# Patient Record
Sex: Female | Born: 1935
Health system: Southern US, Community
[De-identification: ages and names within clinical notes are randomized; demographics above are authoritative.]

## PROBLEM LIST (undated history)

## (undated) DIAGNOSIS — E039 Hypothyroidism, unspecified: Secondary | ICD-10-CM

## (undated) DIAGNOSIS — M810 Age-related osteoporosis without current pathological fracture: Secondary | ICD-10-CM

## (undated) DIAGNOSIS — E041 Nontoxic single thyroid nodule: Secondary | ICD-10-CM

## (undated) HISTORY — DX: Hypothyroidism, unspecified: E03.9

## (undated) HISTORY — DX: Nontoxic single thyroid nodule: E04.1

## (undated) HISTORY — DX: Age-related osteoporosis without current pathological fracture: M81.0

---

## 2009-04-12 ENCOUNTER — Ambulatory Visit: Payer: Self-pay | Admitting: Family Medicine

## 2009-04-12 DIAGNOSIS — N9489 Other specified conditions associated with female genital organs and menstrual cycle: Secondary | ICD-10-CM

## 2009-04-12 LAB — CONVERTED CEMR LAB
Bilirubin Urine: NEGATIVE
Blood in Urine, dipstick: NEGATIVE
Protein, U semiquant: NEGATIVE
Specific Gravity, Urine: 1.02
pH: 5

## 2009-04-13 ENCOUNTER — Encounter: Payer: Self-pay | Admitting: Family Medicine

## 2009-04-13 LAB — CONVERTED CEMR LAB
Clue Cells Wet Prep HPF POC: NONE SEEN
WBC, Wet Prep HPF POC: NONE SEEN

## 2010-04-29 ENCOUNTER — Ambulatory Visit: Payer: Self-pay | Admitting: Family Medicine

## 2010-04-29 DIAGNOSIS — N760 Acute vaginitis: Secondary | ICD-10-CM | POA: Insufficient documentation

## 2010-04-29 LAB — CONVERTED CEMR LAB
Bilirubin Urine: NEGATIVE
Glucose, Urine, Semiquant: NEGATIVE
Nitrite: NEGATIVE
Urobilinogen, UA: 0.2

## 2010-04-30 ENCOUNTER — Encounter: Payer: Self-pay | Admitting: Family Medicine

## 2010-05-01 LAB — CONVERTED CEMR LAB
Clue Cells Wet Prep HPF POC: NONE SEEN
Trich, Wet Prep: NONE SEEN

## 2010-05-03 ENCOUNTER — Telehealth (INDEPENDENT_AMBULATORY_CARE_PROVIDER_SITE_OTHER): Payer: Self-pay | Admitting: *Deleted

## 2010-08-20 NOTE — Progress Notes (Signed)
Summary: pt with a med. question-jr  Phone Note Call from Patient   Caller: Patient Summary of Call: Was treated last week for a UTI with a 3 day Rx. and she is leaving for [place] and would like to have another Rx. on hand since she is going out of town.... Call her back and let her know the status at [phone].Marland KitchenMarland KitchenIf you need me to, I will call her back and let her know what you decided to do...Marland KitchenMarland KitchenThanks.Michaelle Copas  May 03, 2010 9:09 AM  Initial call taken by: Michaelle Copas,  May 03, 2010 9:09 AM  Follow-up for Phone Call        Will refill x 1 but if self treats and doesn' tget better then needs to see an MD down there.  Follow-up by: Nani Gasser MD,  May 03, 2010 11:56 AM  Additional Follow-up for Phone Call Additional follow up Details #1::        Pt notified Additional Follow-up by: Kathlene November LPN,  May 03, 2010 11:59 AM    Prescriptions: BACTRIM DS 800-160 MG TABS (SULFAMETHOXAZOLE-TRIMETHOPRIM) Take 1 tablet by mouth two times a day for 3 days  #6 x 0   Entered and Authorized by:   Nani Gasser MD   Signed by:   Nani Gasser MD on 05/03/2010   Method used:   [facility]        [facility] [phone][facility] (retail)       [address] [place], [place]  [address]       Ph: [phone] or [phone]       Fax: [phone]   RxID:   [id number]

## 2010-08-20 NOTE — Assessment & Plan Note (Signed)
Summary: HAVING PROBLEM W/ URINATING/PAIN   Vital Signs:  Patient profile:   77 year old female Height:      63.5 inches Weight:      126 pounds BMI:     22.05 Pulse rate:   87 / minute BP sitting:   154 / 71  (right arm) Cuff size:   regular  Vitals Entered By: Avon Gully CMA, Duncan Dull) (April 29, 2010 12:55 PM) CC: vaginal irritation, incourse last wed, vaginal irritation thurs and friday, did have a soreness in rt groin   Primary Care Provider:  Nani Gasser MD  CC:  vaginal irritation, incourse last wed, vaginal irritation thurs and friday, and did have a soreness in rt groin.  History of Present Illness: Bp was 120/60 a week ago  Had intercouse last week. Felt sore afterwards.  After a few days couldn't urinate without extreme pain.  Burning sensation.  Wonders if irritation from intercourse. Did use a lubricant. Did use the clobetasol twice and this did help some.  It is a little better today.  No change in vaginal d/c.    Current Medications (verified): 1)  Multivitamins  Tabs (Multiple Vitamin) .... Take One Tablt By Mouth Once A Day 2)  Clobetasol Propionate 0.05 % Oint (Clobetasol Propionate) .... Apply Two Times A Day To Affected Area As Needed  Allergies (verified): 1)  ! * Mycins  Physical Exam  General:  Well-developed,well-nourished,in no acute distress; alert,appropriate and cooperative throughout examination Genitalia:  + vaginal atrophy. Has somesmall skin tears at the 12 o'clock position, No sign of infection or lesions.    Impression & Recommendations:  Problem # 1:  VAGINITIS (ICD-616.10)  Discussed vaginal atrophy. Recommend treat with a topical estrogen cream since she is still sexually active. Will send a wet prep and urine cx since di dhave blood in the UA.   For now recommend use Replens.    Orders: T-Wet Prep for Christoper Allegra, [facility] [phone]) T-Urine Culture (Spectrum Order) [phone])  Complete Medication List: 1)   Multivitamins Tabs (Multiple vitamin) .... Take one tablt by mouth once a day 2)  Clobetasol Propionate 0.05 % Oint (Clobetasol propionate) .... Apply two times a day to affected area as needed 3)  Estrace 0.1 Mg/gm Crea (Estradiol) .... Apply  gram vaginally with applicator  2 x per week. once symptoms improved can dec to 1 x week. Prescriptions: ESTRACE 0.1 MG/GM CREA (ESTRADIOL) Apply  gram vaginally with applicator  2 x per week. ONce symptoms improved can dec to 1 x week.  #1 kit x 3   Entered and Authorized by:   Nani Gasser MD   Signed by:   Nani Gasser MD on 04/29/2010   Method used:   [facility]        [facility] [phone]* (retail)       [address] [place], [place]  [address]       Ph: [phone] or [phone]       Fax: [phone]   RxID:   [id number]   Laboratory Results   Urine Tests  Date/Time Received: 10/10/111 Date/Time Reported: 04/29/10  Routine Urinalysis   Color: yellow Appearance: Clear Glucose: negative   (Normal Range: Negative) Bilirubin: negative   (Normal Range: Negative) Ketone: negative   (Normal Range: Negative) Spec. Gravity: 1.025   (Normal Range: 1.003-1.035) Blood: large   (Normal Range: Negative) pH: 5.0   (Normal Range: 5.0-8.0) Protein: 30   (  Normal Range: Negative) Urobilinogen: 0.2   (Normal Range: 0-1) Nitrite: negative   (Normal Range: Negative) Leukocyte Esterace: small   (Normal Range: Negative)

## 2011-02-27 ENCOUNTER — Ambulatory Visit (INDEPENDENT_AMBULATORY_CARE_PROVIDER_SITE_OTHER): Payer: BC Managed Care – PPO | Admitting: Family Medicine

## 2011-02-27 DIAGNOSIS — Z23 Encounter for immunization: Secondary | ICD-10-CM

## 2011-02-27 MED ORDER — VARICELLA VIRUS VACCINE LIVE 1350 PFU/0.5ML IJ SUSR
0.5000 mL | Freq: Once | INTRAMUSCULAR | Status: DC
  Administered 2011-02-27: 0.5 mL via SUBCUTANEOUS

## 2011-02-27 NOTE — Progress Notes (Signed)
  Subjective:    Patient ID: Claire Cox, female    DOB: 18-May-1934, 77 y.o.   MRN: [id number]  HPI  Here for shingles vaccine.   Review of Systems     Objective:   Physical Exam        Assessment & Plan:

## 2011-02-28 MED ORDER — ZOSTER VACCINE LIVE 19400 UNT/0.65ML ~~LOC~~ SOLR: 0.6500 mL | Freq: Once | SUBCUTANEOUS | Status: DC

## 2011-03-24 ENCOUNTER — Inpatient Hospital Stay (INDEPENDENT_AMBULATORY_CARE_PROVIDER_SITE_OTHER)
Admission: RE | Admit: 2011-03-24 | Discharge: 2011-03-24 | Disposition: A | Discharge status: Home / Self Care | Payer: BC Managed Care – PPO | Source: Ambulatory Visit | Attending: Family Medicine | Admitting: Family Medicine

## 2011-03-24 ENCOUNTER — Encounter: Payer: Self-pay | Admitting: Emergency Medicine

## 2011-03-24 DIAGNOSIS — M25529 Pain in unspecified elbow: Secondary | ICD-10-CM

## 2011-03-24 DIAGNOSIS — M25519 Pain in unspecified shoulder: Secondary | ICD-10-CM | POA: Insufficient documentation

## 2011-03-25 ENCOUNTER — Telehealth (INDEPENDENT_AMBULATORY_CARE_PROVIDER_SITE_OTHER): Payer: Self-pay | Admitting: Emergency Medicine

## 2011-04-04 ENCOUNTER — Ambulatory Visit (INDEPENDENT_AMBULATORY_CARE_PROVIDER_SITE_OTHER): Payer: BC Managed Care – PPO | Admitting: Family Medicine

## 2011-04-04 DIAGNOSIS — Z23 Encounter for immunization: Secondary | ICD-10-CM

## 2011-04-04 NOTE — Progress Notes (Signed)
  Subjective:    Patient ID: Claire Cox, female    DOB: 1934-05-26, 77 y.o.   MRN: [id number]  HPI TDAP only    Review of Systems     Objective:   Physical Exam        Assessment & Plan:

## 2011-06-23 NOTE — Telephone Encounter (Signed)
  Phone Note Outgoing Call   Call placed by: Lavell Islam RN,  March 25, 2011 2:27 PM Call placed to: Patient Summary of Call: Left message on voice mail inquiring about patient's status; encouraged her to call with questions/concerns. Initial call taken by: Lavell Islam RN,  March 25, 2011 2:28 PM

## 2011-06-23 NOTE — Progress Notes (Signed)
Summary: fall,arm inj...wse rm 3   Vital Signs:  Patient Profile:   77 Years Old Female CC:      RT elbow pain x this AM Height:     63.5 inches Weight:      125.50 pounds O2 Sat:      100 % O2 treatment:    Room Air Temp:     98.0 degrees F oral Pulse rate:   87 / minute Resp:     18 per minute BP sitting:   128 / 65  (left arm) Cuff size:   regular  Vitals Entered By: Clemens Catholic LPN (March 24, 2011 12:06 PM)                  Updated Prior Medication List: MULTIVITAMINS  TABS (MULTIPLE VITAMIN) Take one tablt by mouth once a day  Current Allergies: ! * MYCINS ! VALIUMHistory of Present Illness Chief Complaint: RT elbow pain x this AM History of Present Illness: She was outside this AM around 10am and tripped and fell.  She landed on her R side and felt pain in her R elbow that radiated to her R hand.  No h/o osteoporosis or other fractures.  She went into her house and became slightly faint due to the pain, so she iced it.  She did  not hit her head.  Since then, the pain has improved and after the wait in the waiting room, she feels almost 100% better.  Residual symptoms include mild R elbow pain, mild R posterior shoulder pain, no radiation.  She has not used any meds but ice does help.  REVIEW OF SYSTEMS Constitutional Symptoms      Denies fever, chills, night sweats, weight loss, weight gain, and fatigue.  Eyes       Denies change in vision, eye pain, eye discharge, glasses, contact lenses, and eye surgery. Ear/Nose/Throat/Mouth       Denies hearing loss/aids, change in hearing, ear pain, ear discharge, dizziness, frequent runny nose, frequent nose bleeds, sinus problems, sore throat, hoarseness, and tooth pain or bleeding.  Respiratory       Denies dry cough, productive cough, wheezing, shortness of breath, asthma, bronchitis, and emphysema/COPD.  Cardiovascular       Complains of fanting.      Denies murmurs, chest pain, tires easily with exhertion, and  fainting.    Gastrointestinal       Denies stomach pain, nausea/vomiting, diarrhea, constipation, blood in bowel movements, and indigestion.      Comments: nausea Genitourniary       Denies painful urination, kidney stones, and loss of urinary control. Neurological       Denies paralysis, seizures, and fainting/blackouts. Musculoskeletal       Denies muscle pain, joint pain, joint stiffness, decreased range of motion, redness, swelling, muscle weakness, and gout.  Skin       Denies bruising, unusual mles/lumps or sores, and hair/skin or nail changes.  Psych       Denies mood changes, temper/anger issues, anxiety/stress, speech problems, depression, and sleep problems. Other Comments: pt states that she fell this AM and injured her RT elbow, scraped her RT low back and Bil hands. she has a knot on her RT shoulder. she states that she felt like she was going to pass out and nausea after fall from the pain.   Past History:  Past Medical History: Reviewed history from 04/12/2009 and no changes required. infectious Hepatitis in 1960  Past Surgical History:  Reviewed history from 04/12/2009 and no changes required. Finger Surgery 11/2008  Family History: Reviewed history from 04/12/2009 and no changes required. Both parent with MI  Social History: Reviewed history from 04/12/2009 and no changes required. REtired. Masters degreee.   Former Smoker, [id number] Alcohol use-yes, 1 per week Drug use-no Regular exercise-yes, walking and weight 5-6 day per week.  Physical Exam General appearance: well developed, well nourished, no acute distress MSE: oriented to time, place, and person R elbow: FROM flex/ext/sup/pron, mild TTP insertion of triceps, but no bony TTP radial head or olecranon.  Distal NV status is intact.  NO bruising or swelling noticed. R shoulder: FROM, mild TTP and spasm at levator scapula and superior angle.  No bruising.  Spurling neg. Assessment New Problems: SHOULDER  PAIN (ICD-719.41) ELBOW PAIN (ICD-719.42)   Plan New Orders: New Patient Level II [99202] Planning Comments:   Talking with the patient and her husband, I don't feel that an Xray is warranted since her pain is almost completely gone.  Due to her age, if not improving, I told her to return for an Xray.  Instead should use ice, elevation, rest, Motrin as needed.  I feel that she may have an insertional triceps traumatic tendonitis with trapezius/levator scapula strain.  If any HA, confusion, worsening symptoms, needs to seek medical care.  We'll call her tomorrow to follow up.   The patient and/or caregiver has been counseled thoroughly with regard to medications prescribed including dosage, schedule, interactions, rationale for use, and possible side effects and they verbalize understanding.  Diagnoses and expected course of recovery discussed and will return if not improved as expected or if the condition worsens. Patient and/or caregiver verbalized understanding.   Orders Added: 1)  New Patient Level II [99202]

## 2011-08-19 DIAGNOSIS — H4300 Vitreous prolapse, unspecified eye: Secondary | ICD-10-CM | POA: Diagnosis not present

## 2011-08-19 DIAGNOSIS — H35379 Puckering of macula, unspecified eye: Secondary | ICD-10-CM | POA: Diagnosis not present

## 2011-08-19 DIAGNOSIS — H25019 Cortical age-related cataract, unspecified eye: Secondary | ICD-10-CM | POA: Diagnosis not present

## 2011-08-19 DIAGNOSIS — H526 Other disorders of refraction: Secondary | ICD-10-CM | POA: Diagnosis not present

## 2011-08-27 DIAGNOSIS — E049 Nontoxic goiter, unspecified: Secondary | ICD-10-CM | POA: Diagnosis not present

## 2011-08-27 DIAGNOSIS — E041 Nontoxic single thyroid nodule: Secondary | ICD-10-CM | POA: Diagnosis not present

## 2011-09-01 DIAGNOSIS — H2700 Aphakia, unspecified eye: Secondary | ICD-10-CM | POA: Diagnosis not present

## 2011-09-01 DIAGNOSIS — H2510 Age-related nuclear cataract, unspecified eye: Secondary | ICD-10-CM | POA: Diagnosis not present

## 2011-09-01 DIAGNOSIS — H4020X0 Unspecified primary angle-closure glaucoma, stage unspecified: Secondary | ICD-10-CM | POA: Diagnosis not present

## 2011-09-01 DIAGNOSIS — H40019 Open angle with borderline findings, low risk, unspecified eye: Secondary | ICD-10-CM | POA: Diagnosis not present

## 2011-09-01 DIAGNOSIS — H25019 Cortical age-related cataract, unspecified eye: Secondary | ICD-10-CM | POA: Diagnosis not present

## 2011-09-04 DIAGNOSIS — M949 Disorder of cartilage, unspecified: Secondary | ICD-10-CM | POA: Diagnosis not present

## 2011-09-04 DIAGNOSIS — M899 Disorder of bone, unspecified: Secondary | ICD-10-CM | POA: Diagnosis not present

## 2011-09-04 DIAGNOSIS — E041 Nontoxic single thyroid nodule: Secondary | ICD-10-CM | POA: Diagnosis not present

## 2011-09-08 DIAGNOSIS — E041 Nontoxic single thyroid nodule: Secondary | ICD-10-CM | POA: Diagnosis not present

## 2011-09-08 DIAGNOSIS — M818 Other osteoporosis without current pathological fracture: Secondary | ICD-10-CM | POA: Diagnosis not present

## 2011-09-11 DIAGNOSIS — E041 Nontoxic single thyroid nodule: Secondary | ICD-10-CM | POA: Diagnosis not present

## 2011-09-11 DIAGNOSIS — M818 Other osteoporosis without current pathological fracture: Secondary | ICD-10-CM | POA: Diagnosis not present

## 2011-09-17 DIAGNOSIS — E041 Nontoxic single thyroid nodule: Secondary | ICD-10-CM | POA: Diagnosis not present

## 2011-09-17 DIAGNOSIS — M818 Other osteoporosis without current pathological fracture: Secondary | ICD-10-CM | POA: Diagnosis not present

## 2011-09-19 HISTORY — PX: CATARACT EXTRACTION W/ INTRAOCULAR LENS IMPLANT: SHX1309

## 2011-09-23 DIAGNOSIS — E041 Nontoxic single thyroid nodule: Secondary | ICD-10-CM | POA: Diagnosis not present

## 2011-09-26 ENCOUNTER — Other Ambulatory Visit: Payer: Self-pay | Admitting: *Deleted

## 2011-09-26 MED ORDER — CLOBETASOL PROPIONATE 0.05 % EX OINT: TOPICAL_OINTMENT | Freq: Two times a day (BID) | CUTANEOUS | Status: DC

## 2011-09-26 NOTE — Telephone Encounter (Signed)
Med entered. No pharm selected. If doesn't resolve as usual then needs appt for eval.

## 2011-09-26 NOTE — Telephone Encounter (Signed)
Pt is having a vaginal lesion outbreak and would like to know if we can send an rx for clobetasol propionate 0.05% ointment to the pharmacy. States the last one she had filled is old. Please advise.

## 2011-10-06 DIAGNOSIS — E039 Hypothyroidism, unspecified: Secondary | ICD-10-CM | POA: Diagnosis not present

## 2011-10-06 DIAGNOSIS — Z79899 Other long term (current) drug therapy: Secondary | ICD-10-CM | POA: Diagnosis not present

## 2011-10-06 DIAGNOSIS — H269 Unspecified cataract: Secondary | ICD-10-CM | POA: Diagnosis not present

## 2011-10-06 DIAGNOSIS — K219 Gastro-esophageal reflux disease without esophagitis: Secondary | ICD-10-CM | POA: Diagnosis not present

## 2011-10-06 DIAGNOSIS — M129 Arthropathy, unspecified: Secondary | ICD-10-CM | POA: Diagnosis not present

## 2011-10-06 DIAGNOSIS — H268 Other specified cataract: Secondary | ICD-10-CM | POA: Diagnosis not present

## 2011-10-06 DIAGNOSIS — M899 Disorder of bone, unspecified: Secondary | ICD-10-CM | POA: Diagnosis not present

## 2011-10-06 DIAGNOSIS — H2510 Age-related nuclear cataract, unspecified eye: Secondary | ICD-10-CM | POA: Diagnosis not present

## 2011-10-07 DIAGNOSIS — H2510 Age-related nuclear cataract, unspecified eye: Secondary | ICD-10-CM | POA: Diagnosis not present

## 2011-10-20 DIAGNOSIS — E041 Nontoxic single thyroid nodule: Secondary | ICD-10-CM | POA: Diagnosis not present

## 2011-10-20 DIAGNOSIS — M818 Other osteoporosis without current pathological fracture: Secondary | ICD-10-CM | POA: Diagnosis not present

## 2011-10-20 DIAGNOSIS — E039 Hypothyroidism, unspecified: Secondary | ICD-10-CM | POA: Diagnosis not present

## 2011-10-30 DIAGNOSIS — E041 Nontoxic single thyroid nodule: Secondary | ICD-10-CM | POA: Diagnosis not present

## 2011-10-30 DIAGNOSIS — E063 Autoimmune thyroiditis: Secondary | ICD-10-CM | POA: Diagnosis not present

## 2011-11-17 DIAGNOSIS — S40019A Contusion of unspecified shoulder, initial encounter: Secondary | ICD-10-CM | POA: Diagnosis not present

## 2011-11-17 DIAGNOSIS — E039 Hypothyroidism, unspecified: Secondary | ICD-10-CM | POA: Diagnosis not present

## 2011-11-17 DIAGNOSIS — M810 Age-related osteoporosis without current pathological fracture: Secondary | ICD-10-CM | POA: Diagnosis not present

## 2011-11-17 DIAGNOSIS — M25519 Pain in unspecified shoulder: Secondary | ICD-10-CM | POA: Diagnosis not present

## 2011-12-10 DIAGNOSIS — H01009 Unspecified blepharitis unspecified eye, unspecified eyelid: Secondary | ICD-10-CM | POA: Diagnosis not present

## 2011-12-10 DIAGNOSIS — H18239 Secondary corneal edema, unspecified eye: Secondary | ICD-10-CM | POA: Diagnosis not present

## 2011-12-24 DIAGNOSIS — E041 Nontoxic single thyroid nodule: Secondary | ICD-10-CM | POA: Diagnosis not present

## 2011-12-24 DIAGNOSIS — E039 Hypothyroidism, unspecified: Secondary | ICD-10-CM | POA: Diagnosis not present

## 2011-12-24 DIAGNOSIS — M818 Other osteoporosis without current pathological fracture: Secondary | ICD-10-CM | POA: Diagnosis not present

## 2012-01-19 ENCOUNTER — Encounter: Payer: Self-pay | Admitting: Family Medicine

## 2012-01-19 DIAGNOSIS — E041 Nontoxic single thyroid nodule: Secondary | ICD-10-CM

## 2012-01-19 DIAGNOSIS — E039 Hypothyroidism, unspecified: Secondary | ICD-10-CM | POA: Insufficient documentation

## 2012-01-19 DIAGNOSIS — M810 Age-related osteoporosis without current pathological fracture: Secondary | ICD-10-CM

## 2012-01-19 HISTORY — DX: Nontoxic single thyroid nodule: E04.1

## 2012-01-19 HISTORY — DX: Hypothyroidism, unspecified: E03.9

## 2012-01-19 HISTORY — DX: Age-related osteoporosis without current pathological fracture: M81.0

## 2012-01-21 DIAGNOSIS — H526 Other disorders of refraction: Secondary | ICD-10-CM | POA: Diagnosis not present

## 2012-01-21 DIAGNOSIS — H18239 Secondary corneal edema, unspecified eye: Secondary | ICD-10-CM | POA: Diagnosis not present

## 2012-01-21 DIAGNOSIS — H01009 Unspecified blepharitis unspecified eye, unspecified eyelid: Secondary | ICD-10-CM | POA: Diagnosis not present

## 2012-01-21 DIAGNOSIS — H26499 Other secondary cataract, unspecified eye: Secondary | ICD-10-CM | POA: Diagnosis not present

## 2012-02-09 ENCOUNTER — Encounter: Payer: Self-pay | Admitting: Family Medicine

## 2012-02-09 ENCOUNTER — Ambulatory Visit (INDEPENDENT_AMBULATORY_CARE_PROVIDER_SITE_OTHER): Payer: Medicare Other | Admitting: Family Medicine

## 2012-02-09 VITALS — BP 141/76 | HR 84 | Wt 121.0 lb

## 2012-02-09 DIAGNOSIS — M810 Age-related osteoporosis without current pathological fracture: Secondary | ICD-10-CM | POA: Diagnosis not present

## 2012-02-09 DIAGNOSIS — M25519 Pain in unspecified shoulder: Secondary | ICD-10-CM | POA: Diagnosis not present

## 2012-02-09 DIAGNOSIS — M25511 Pain in right shoulder: Secondary | ICD-10-CM

## 2012-02-09 DIAGNOSIS — E039 Hypothyroidism, unspecified: Secondary | ICD-10-CM | POA: Diagnosis not present

## 2012-02-09 DIAGNOSIS — E041 Nontoxic single thyroid nodule: Secondary | ICD-10-CM

## 2012-02-09 NOTE — Progress Notes (Signed)
  Subjective:    Patient ID: Claire Cox, female    DOB: 1933/12/26, 78 y.o.   MRN: [id number]  HPI Was in [place] someone with sprain the outside porch with water. Evidently water seeped through the door onto the tile entryway.  She slipped on the tile that was covered with water.  Landed on her shoulder and left hip. She's continued to have significant pain in her shoulder but says her hip really hasn't been bothering her.Micah Flesher to the ED and had an xray . She was told she had no fractures.  Seh would like a referral for cold laser therapy at [facility]. She has had some limitation with range of motion. She has not been taking any medication for her pain or discomfort.  Osteoporosis-she says she declines all treatment. Please see below for further details.   Review of Systems     Objective:   Physical Exam  Constitutional: She is oriented to person, place, and time. She appears well-developed and well-nourished.  HENT:  Head: Normocephalic and atraumatic.  Neck: Neck supple. No thyromegaly present.  Cardiovascular: Normal rate, regular rhythm and normal heart sounds.   Pulmonary/Chest: Effort normal and breath sounds normal.  Musculoskeletal:       Right shoulder is 10 to her over the lateral acromion. No bruising. She is able to extend to about 100. Painful with reaching across and reaching behind her back. She has decreased strength with abduction compared her left arm.  Lymphadenopathy:    She has no cervical adenopathy.  Neurological: She is alert and oriented to person, place, and time.  Skin: Skin is warm and dry.  Psychiatric: She has a normal mood and affect. Her behavior is normal.          Assessment & Plan:  Right Shoulder Pain  -based on her exam today suspect that she probably has a rotator cuff injury. Will refer to PT for therapy for cold laser. If she's not progressing or improving then please followup for further evaluation.  Can use ibuprofen  or Aleve with food and water. Stop immediately if any GI upset or irritation.  Thyroid Nodule - She is having Dr. Richardson Landry fax over records. She would like for me ot take over care of her thyroid.  She says she was given a prescription for at least 3 months the medication. At that time we can see if she's due for repeat lab check as well as refills on her medication.  Osteoporosis - He also ordered DEXA scan.  Tried fosamax years ago and had horriblle reflux.  Did reclast infucion before but wasn't to discontinue it.  Seh is not interested in any oral tabs.  She is taking a calcium supplement.

## 2012-02-09 NOTE — Patient Instructions (Signed)
We will call you with referral.

## 2012-02-10 ENCOUNTER — Telehealth: Payer: Self-pay | Admitting: Family Medicine

## 2012-02-10 DIAGNOSIS — E039 Hypothyroidism, unspecified: Secondary | ICD-10-CM

## 2012-02-10 NOTE — Telephone Encounter (Signed)
Call patient: We did get the last note from Dr. Neysa Bonito office. He recommended followup in 6 months which would be around December 5. Does go ahead and schedule a followup appointment with me for her thyroid the first week of December. We will recheck her blood work at that time. If you need refills on your thyroxine 75 mcg daily before then please let us know, or have the pharmacy send a refill request to [place].

## 2012-02-10 NOTE — Telephone Encounter (Signed)
Pt notifed and voiced understanding. Does not need refills at this time

## 2012-02-11 DIAGNOSIS — M25619 Stiffness of unspecified shoulder, not elsewhere classified: Secondary | ICD-10-CM | POA: Diagnosis not present

## 2012-02-11 DIAGNOSIS — R269 Unspecified abnormalities of gait and mobility: Secondary | ICD-10-CM | POA: Diagnosis not present

## 2012-02-12 DIAGNOSIS — R269 Unspecified abnormalities of gait and mobility: Secondary | ICD-10-CM | POA: Diagnosis not present

## 2012-02-12 DIAGNOSIS — M25619 Stiffness of unspecified shoulder, not elsewhere classified: Secondary | ICD-10-CM | POA: Diagnosis not present

## 2012-02-17 DIAGNOSIS — R269 Unspecified abnormalities of gait and mobility: Secondary | ICD-10-CM | POA: Diagnosis not present

## 2012-02-17 DIAGNOSIS — M25619 Stiffness of unspecified shoulder, not elsewhere classified: Secondary | ICD-10-CM | POA: Diagnosis not present

## 2012-02-20 DIAGNOSIS — R269 Unspecified abnormalities of gait and mobility: Secondary | ICD-10-CM | POA: Diagnosis not present

## 2012-02-20 DIAGNOSIS — M25619 Stiffness of unspecified shoulder, not elsewhere classified: Secondary | ICD-10-CM | POA: Diagnosis not present

## 2012-02-24 DIAGNOSIS — M25619 Stiffness of unspecified shoulder, not elsewhere classified: Secondary | ICD-10-CM | POA: Diagnosis not present

## 2012-02-24 DIAGNOSIS — R269 Unspecified abnormalities of gait and mobility: Secondary | ICD-10-CM | POA: Diagnosis not present

## 2012-02-27 DIAGNOSIS — M25619 Stiffness of unspecified shoulder, not elsewhere classified: Secondary | ICD-10-CM | POA: Diagnosis not present

## 2012-02-27 DIAGNOSIS — R269 Unspecified abnormalities of gait and mobility: Secondary | ICD-10-CM | POA: Diagnosis not present

## 2012-03-02 DIAGNOSIS — M25619 Stiffness of unspecified shoulder, not elsewhere classified: Secondary | ICD-10-CM | POA: Diagnosis not present

## 2012-03-02 DIAGNOSIS — R269 Unspecified abnormalities of gait and mobility: Secondary | ICD-10-CM | POA: Diagnosis not present

## 2012-03-05 DIAGNOSIS — R269 Unspecified abnormalities of gait and mobility: Secondary | ICD-10-CM | POA: Diagnosis not present

## 2012-03-05 DIAGNOSIS — M25619 Stiffness of unspecified shoulder, not elsewhere classified: Secondary | ICD-10-CM | POA: Diagnosis not present

## 2012-03-09 DIAGNOSIS — R269 Unspecified abnormalities of gait and mobility: Secondary | ICD-10-CM | POA: Diagnosis not present

## 2012-03-09 DIAGNOSIS — M25619 Stiffness of unspecified shoulder, not elsewhere classified: Secondary | ICD-10-CM | POA: Diagnosis not present

## 2012-03-12 DIAGNOSIS — R269 Unspecified abnormalities of gait and mobility: Secondary | ICD-10-CM | POA: Diagnosis not present

## 2012-03-12 DIAGNOSIS — M25619 Stiffness of unspecified shoulder, not elsewhere classified: Secondary | ICD-10-CM | POA: Diagnosis not present

## 2012-03-16 DIAGNOSIS — M25619 Stiffness of unspecified shoulder, not elsewhere classified: Secondary | ICD-10-CM | POA: Diagnosis not present

## 2012-03-16 DIAGNOSIS — R269 Unspecified abnormalities of gait and mobility: Secondary | ICD-10-CM | POA: Diagnosis not present

## 2012-03-19 DIAGNOSIS — R269 Unspecified abnormalities of gait and mobility: Secondary | ICD-10-CM | POA: Diagnosis not present

## 2012-03-19 DIAGNOSIS — M25619 Stiffness of unspecified shoulder, not elsewhere classified: Secondary | ICD-10-CM | POA: Diagnosis not present

## 2012-03-24 DIAGNOSIS — R269 Unspecified abnormalities of gait and mobility: Secondary | ICD-10-CM | POA: Diagnosis not present

## 2012-03-24 DIAGNOSIS — M25619 Stiffness of unspecified shoulder, not elsewhere classified: Secondary | ICD-10-CM | POA: Diagnosis not present

## 2012-03-26 DIAGNOSIS — R269 Unspecified abnormalities of gait and mobility: Secondary | ICD-10-CM | POA: Diagnosis not present

## 2012-03-26 DIAGNOSIS — M25619 Stiffness of unspecified shoulder, not elsewhere classified: Secondary | ICD-10-CM | POA: Diagnosis not present

## 2012-03-30 DIAGNOSIS — M25619 Stiffness of unspecified shoulder, not elsewhere classified: Secondary | ICD-10-CM | POA: Diagnosis not present

## 2012-03-30 DIAGNOSIS — R269 Unspecified abnormalities of gait and mobility: Secondary | ICD-10-CM | POA: Diagnosis not present

## 2012-04-02 DIAGNOSIS — M25619 Stiffness of unspecified shoulder, not elsewhere classified: Secondary | ICD-10-CM | POA: Diagnosis not present

## 2012-04-02 DIAGNOSIS — R269 Unspecified abnormalities of gait and mobility: Secondary | ICD-10-CM | POA: Diagnosis not present

## 2012-04-06 DIAGNOSIS — R269 Unspecified abnormalities of gait and mobility: Secondary | ICD-10-CM | POA: Diagnosis not present

## 2012-04-06 DIAGNOSIS — M25619 Stiffness of unspecified shoulder, not elsewhere classified: Secondary | ICD-10-CM | POA: Diagnosis not present

## 2012-04-09 DIAGNOSIS — R269 Unspecified abnormalities of gait and mobility: Secondary | ICD-10-CM | POA: Diagnosis not present

## 2012-04-09 DIAGNOSIS — M25619 Stiffness of unspecified shoulder, not elsewhere classified: Secondary | ICD-10-CM | POA: Diagnosis not present

## 2012-04-13 DIAGNOSIS — M25619 Stiffness of unspecified shoulder, not elsewhere classified: Secondary | ICD-10-CM | POA: Diagnosis not present

## 2012-04-13 DIAGNOSIS — R269 Unspecified abnormalities of gait and mobility: Secondary | ICD-10-CM | POA: Diagnosis not present

## 2012-04-16 DIAGNOSIS — R269 Unspecified abnormalities of gait and mobility: Secondary | ICD-10-CM | POA: Diagnosis not present

## 2012-04-16 DIAGNOSIS — M25619 Stiffness of unspecified shoulder, not elsewhere classified: Secondary | ICD-10-CM | POA: Diagnosis not present

## 2012-04-19 ENCOUNTER — Telehealth: Payer: Self-pay | Admitting: *Deleted

## 2012-04-19 ENCOUNTER — Encounter: Payer: Self-pay | Admitting: Physician Assistant

## 2012-04-19 ENCOUNTER — Ambulatory Visit (INDEPENDENT_AMBULATORY_CARE_PROVIDER_SITE_OTHER): Payer: Medicare Other | Admitting: Physician Assistant

## 2012-04-19 VITALS — BP 149/74 | HR 88 | Temp 97.8°F | Ht 63.5 in | Wt 121.0 lb

## 2012-04-19 DIAGNOSIS — R300 Dysuria: Secondary | ICD-10-CM

## 2012-04-19 DIAGNOSIS — R030 Elevated blood-pressure reading, without diagnosis of hypertension: Secondary | ICD-10-CM

## 2012-04-19 LAB — POCT URINALYSIS DIPSTICK
Glucose, UA: NEGATIVE
Ketones, UA: NEGATIVE
Protein, UA: NEGATIVE
Spec Grav, UA: 1.015

## 2012-04-19 MED ORDER — CIPROFLOXACIN HCL 500 MG PO TABS: 500.0000 mg | ORAL_TABLET | Freq: Two times a day (BID) | ORAL | Status: DC

## 2012-04-19 NOTE — Progress Notes (Signed)
  Subjective:    Patient ID: Claire Cox, female    DOB: May 27, 1934, 78 y.o.   MRN: [id number]  HPI Patient is a 78 yo female who presents to the clinic with urinary frequency for 5 days. She denies any fever, back pain, lower abdominal pressure, blood in urine, or discharge. She reports miminal pain with urination. She took a home urine test this morning at 4am and was positive for leukocytes. She has been drinking cranberry juice and lots of water.She has had a few chills in the middle of the night. She has had a UTI before but not recently.   Pt's blood pressure is elevated today. Pt denies problem with BP in the past. She does not like going to the doctor and she admits to a salty meal last night. Denies any CP, palpitations, SOB.   Review of Systems     Objective:   Physical Exam  Constitutional: She appears well-developed and well-nourished.  HENT:  Head: Normocephalic and atraumatic.  Cardiovascular: Normal rate, regular rhythm and normal heart sounds.   Pulmonary/Chest: Effort normal and breath sounds normal. She has no wheezes.       NO CVA tenderness.  Abdominal: There is no tenderness.  Neurological: She is alert.  Skin: Skin is warm and dry.  Psychiatric: She has a normal mood and affect. Her behavior is normal.          Assessment & Plan:  Urinary frequency- UA in office negative for blood, leuks, and nitrates. Will send for culture. I am going to treat empirically due to age and not wanting this to worsen. Will call pt if she can stop abx. Will treat with cipro for 3 days. Pt reported she tolerated cipro well. Pt is to call if symptoms worsen. H.O. Was given for symptomatic treatment of UTI.   Elevated BP- Patient will monitor [facility]. I encouraged her to call in with results. Discussed making sure diet was low is salt. She has not had a physical in a while or bloodwork. Discussed with patient need for blood work. She is aware and will  consider. She hates medicine and doesn't want to take anything if she doesn't have too. She agreed to consider. Discussed need to watch blood pressure.   Declined flu shot she may reconsider when she comes back for CPE.

## 2012-04-19 NOTE — Telephone Encounter (Signed)
Pt calls stating she checked her BP after apt this AM and BP was 121/65.

## 2012-04-19 NOTE — Telephone Encounter (Signed)
Great! This is reassuring.

## 2012-04-19 NOTE — Patient Instructions (Signed)
Call with blood pressure reading.   Urinary Tract Infection Infections of the urinary tract can start in several places. A bladder infection (cystitis), a kidney infection (pyelonephritis), and a prostate infection (prostatitis) are different types of urinary tract infections (UTIs). They usually get better if treated with medicines (antibiotics) that kill germs. Take all the medicine until it is gone. You or your child may feel better in a few days, but TAKE ALL MEDICINE or the infection may not respond and may become more difficult to treat. HOME CARE INSTRUCTIONS   Drink enough water and fluids to keep the urine clear or pale yellow. Cranberry juice is especially recommended, in addition to large amounts of water.   Avoid caffeine, tea, and carbonated beverages. They tend to irritate the bladder.   Alcohol may irritate the prostate.   Only take over-the-counter or prescription medicines for pain, discomfort, or fever as directed by your caregiver.  To prevent further infections:  Empty the bladder often. Avoid holding urine for long periods of time.   After a bowel movement, women should cleanse from front to back. Use each tissue only once.   Empty the bladder before and after sexual intercourse.  FINDING OUT THE RESULTS OF YOUR TEST Not all test results are available during your visit. If your or your child's test results are not back during the visit, make an appointment with your caregiver to find out the results. Do not assume everything is normal if you have not heard from your caregiver or the medical facility. It is important for you to follow up on all test results. SEEK MEDICAL CARE IF:   There is back pain.   Your baby is older than 3 months with a rectal temperature of 100.5 F (38.1 C) or higher for more than 1 day.   Your or your child's problems (symptoms) are no better in 3 days. Return sooner if you or your child is getting worse.  SEEK IMMEDIATE MEDICAL CARE IF:    There is severe back pain or lower abdominal pain.   You or your child develops chills.   You have a fever.   Your baby is older than 3 months with a rectal temperature of 102 F (38.9 C) or higher.   Your baby is 51 months old or younger with a rectal temperature of 100.4 F (38 C) or higher.   There is nausea or vomiting.   There is continued burning or discomfort with urination.  MAKE SURE YOU:   Understand these instructions.   Will watch your condition.   Will get help right away if you are not doing well or get worse.  Document Released: 04/16/2005 Document Revised: 06/26/2011 Document Reviewed: 11/19/2006 [facility] Patient Information 2012 [place], [place].

## 2012-04-22 LAB — URINE CULTURE: Colony Count: >=100000

## 2012-04-23 ENCOUNTER — Telehealth: Payer: Self-pay | Admitting: *Deleted

## 2012-04-23 ENCOUNTER — Other Ambulatory Visit: Payer: Self-pay | Admitting: Physician Assistant

## 2012-04-23 DIAGNOSIS — M6281 Muscle weakness (generalized): Secondary | ICD-10-CM | POA: Diagnosis not present

## 2012-04-23 DIAGNOSIS — M75100 Unspecified rotator cuff tear or rupture of unspecified shoulder, not specified as traumatic: Secondary | ICD-10-CM | POA: Diagnosis not present

## 2012-04-23 DIAGNOSIS — M533 Sacrococcygeal disorders, not elsewhere classified: Secondary | ICD-10-CM

## 2012-04-23 DIAGNOSIS — M25619 Stiffness of unspecified shoulder, not elsewhere classified: Secondary | ICD-10-CM | POA: Diagnosis not present

## 2012-04-23 DIAGNOSIS — M25519 Pain in unspecified shoulder: Secondary | ICD-10-CM | POA: Diagnosis not present

## 2012-04-23 MED ORDER — NITROFURANTOIN MONOHYD MACRO 100 MG PO CAPS: 100.0000 mg | ORAL_CAPSULE | Freq: Two times a day (BID) | ORAL | Status: DC

## 2012-04-23 NOTE — Telephone Encounter (Signed)
Pt calls stating she would like a referral for PT to [facility] for SI joint pain. She also requested a CB from Dr. Judie Petit. I asked if I could her w/ anything or tell Dr. Judie Petit what the call was in reference to but Pt insisted that Dr. Judie Petit herself call her back. [phone]

## 2012-04-23 NOTE — Telephone Encounter (Signed)
Referral placed. I also called patient discussed her concerns. She had some concerns about her blood pressure as well as the urine. We talked through this and I think she is fine now. She knows to pick up her new prescription to get her urinary tract infection cleared. Also claimed her the Cipro is typically a 3 day treatment which she was concerned it was too short of a treatment.

## 2012-04-30 DIAGNOSIS — M25519 Pain in unspecified shoulder: Secondary | ICD-10-CM | POA: Diagnosis not present

## 2012-04-30 DIAGNOSIS — M6281 Muscle weakness (generalized): Secondary | ICD-10-CM | POA: Diagnosis not present

## 2012-04-30 DIAGNOSIS — M75100 Unspecified rotator cuff tear or rupture of unspecified shoulder, not specified as traumatic: Secondary | ICD-10-CM | POA: Diagnosis not present

## 2012-04-30 DIAGNOSIS — M25619 Stiffness of unspecified shoulder, not elsewhere classified: Secondary | ICD-10-CM | POA: Diagnosis not present

## 2012-05-03 DIAGNOSIS — M545 Low back pain: Secondary | ICD-10-CM | POA: Diagnosis not present

## 2012-05-03 DIAGNOSIS — R269 Unspecified abnormalities of gait and mobility: Secondary | ICD-10-CM | POA: Diagnosis not present

## 2012-05-03 DIAGNOSIS — M6281 Muscle weakness (generalized): Secondary | ICD-10-CM | POA: Diagnosis not present

## 2012-05-10 DIAGNOSIS — M6281 Muscle weakness (generalized): Secondary | ICD-10-CM | POA: Diagnosis not present

## 2012-05-10 DIAGNOSIS — R269 Unspecified abnormalities of gait and mobility: Secondary | ICD-10-CM | POA: Diagnosis not present

## 2012-05-10 DIAGNOSIS — M545 Low back pain: Secondary | ICD-10-CM | POA: Diagnosis not present

## 2012-05-13 ENCOUNTER — Ambulatory Visit (INDEPENDENT_AMBULATORY_CARE_PROVIDER_SITE_OTHER): Payer: Medicare Other | Admitting: Family Medicine

## 2012-05-13 ENCOUNTER — Encounter: Payer: Self-pay | Admitting: Family Medicine

## 2012-05-13 VITALS — BP 157/73 | HR 77 | Temp 98.3°F | Ht 63.5 in | Wt 122.0 lb

## 2012-05-13 DIAGNOSIS — N390 Urinary tract infection, site not specified: Secondary | ICD-10-CM

## 2012-05-13 DIAGNOSIS — R300 Dysuria: Secondary | ICD-10-CM

## 2012-05-13 LAB — POCT URINALYSIS DIPSTICK
Leukocytes, UA: NEGATIVE
Nitrite, UA: NEGATIVE
Protein, UA: NEGATIVE
Urobilinogen, UA: 0.2

## 2012-05-13 MED ORDER — AMOXICILLIN-POT CLAVULANATE 500-125 MG PO TABS: ORAL_TABLET | ORAL | Status: DC

## 2012-05-13 NOTE — Progress Notes (Signed)
CC: Claire Cox is a 78 y.o. female is here for Dysuria   Subjective: HPI:  Patient complains of approximately 3 weeks of pelvic cramping occurring while urinating and a sensation of urinary frequency. This initially started late September she was started on Cipro and had no response despite enterococcus growing out of her urine culture sensitive to levofloxacin. She tells me she took nitrofurantoin and had complete resolution of her symptoms until she stopped taking this. She then went on a cranberry an herbal remedy regimen for the past 2 weeks her symptoms have waxed and waned. She's concerned today that she may still harbor a bladder infection. She did a home UTI test which was positive for leukocytes. She denies fevers, chills, confusion, flank pain, GI disturbance, change in the odor color or consistency of her urine, nor gynecological complaints.  Review Of Systems Outlined In HPI  No past medical history on file.   No family history on file.   History  Substance Use Topics  . Smoking status: Former Games developer  . Smokeless tobacco: Not on file  . Alcohol Use: Not on file     Objective: Filed Vitals:   05/13/12 1056  BP: 157/73  Pulse: 77  Temp: 98.3 F (36.8 C)    General: Alert and Oriented, No Acute Distress HEENT: Moist mucous membranes  Lungs: Comfortable work of breathing Cardiac: Regular rate and rhythm.  Abdomen: Soft nontender Back: No CVA tenderness Extremities: No peripheral edema.   Mental Status: No depression, anxiety, nor agitation.   Assessment & Plan: [name] was seen today for dysuria.  Diagnoses and associated orders for this visit:  Dysuria - Urinalysis Dipstick - Urine Culture  Uti (lower urinary tract infection) - Discontinue: amoxicillin-clavulanate (AUGMENTIN) 500-125 MG per tablet; Take one by mouth every 8 hours for ten total days. - amoxicillin-clavulanate (AUGMENTIN) 500-125 MG per tablet; Take one by mouth every 8 hours for seven  days.    Interestingly her UA is not suggestive of a urinary tract infection however nor was it weeks ago when it eventually grew out enterococcus. Reviewed [facility] guide which reveals penicillins would be appropriate for treatment, interestingly ampicillin listed as sensitive on last culture. She'll start amoxicillin as above we'll await culture and I will call her over the weekend if and when results are available. Signs and symptoms requring emergent/urgent reevaluation were discussed with the patient.  Return if symptoms worsen or fail to improve.

## 2012-05-14 DIAGNOSIS — M545 Low back pain: Secondary | ICD-10-CM | POA: Diagnosis not present

## 2012-05-14 DIAGNOSIS — M6281 Muscle weakness (generalized): Secondary | ICD-10-CM | POA: Diagnosis not present

## 2012-05-14 DIAGNOSIS — R269 Unspecified abnormalities of gait and mobility: Secondary | ICD-10-CM | POA: Diagnosis not present

## 2012-05-15 ENCOUNTER — Telehealth: Payer: Self-pay | Admitting: Family Medicine

## 2012-05-15 LAB — URINE CULTURE: Colony Count: NO GROWTH

## 2012-05-15 MED ORDER — CLOBETASOL PROPIONATE 0.05 % EX OINT: TOPICAL_OINTMENT | Freq: Two times a day (BID) | CUTANEOUS | Status: AC

## 2012-05-17 DIAGNOSIS — R269 Unspecified abnormalities of gait and mobility: Secondary | ICD-10-CM | POA: Diagnosis not present

## 2012-05-17 DIAGNOSIS — M545 Low back pain: Secondary | ICD-10-CM | POA: Diagnosis not present

## 2012-05-17 DIAGNOSIS — M6281 Muscle weakness (generalized): Secondary | ICD-10-CM | POA: Diagnosis not present

## 2012-05-17 NOTE — Telephone Encounter (Signed)
On Saturday I informed patient of her urine culture, no growth.  She was asymptomatic at that point.  She'll return if any problems reoccur.  She requested refill of topical steroid.

## 2012-05-21 DIAGNOSIS — M545 Low back pain: Secondary | ICD-10-CM | POA: Diagnosis not present

## 2012-05-21 DIAGNOSIS — R269 Unspecified abnormalities of gait and mobility: Secondary | ICD-10-CM | POA: Diagnosis not present

## 2012-05-21 DIAGNOSIS — M6281 Muscle weakness (generalized): Secondary | ICD-10-CM | POA: Diagnosis not present

## 2012-05-24 ENCOUNTER — Ambulatory Visit (INDEPENDENT_AMBULATORY_CARE_PROVIDER_SITE_OTHER): Payer: Medicare Other | Admitting: Family Medicine

## 2012-05-24 ENCOUNTER — Other Ambulatory Visit: Payer: Self-pay | Admitting: Family Medicine

## 2012-05-24 ENCOUNTER — Encounter: Payer: Self-pay | Admitting: Family Medicine

## 2012-05-24 ENCOUNTER — Telehealth: Payer: Self-pay

## 2012-05-24 VITALS — BP 159/74 | HR 89 | Temp 97.9°F | Wt 123.0 lb

## 2012-05-24 DIAGNOSIS — M545 Low back pain: Secondary | ICD-10-CM | POA: Diagnosis not present

## 2012-05-24 DIAGNOSIS — R300 Dysuria: Secondary | ICD-10-CM

## 2012-05-24 DIAGNOSIS — M6281 Muscle weakness (generalized): Secondary | ICD-10-CM | POA: Diagnosis not present

## 2012-05-24 DIAGNOSIS — N949 Unspecified condition associated with female genital organs and menstrual cycle: Secondary | ICD-10-CM

## 2012-05-24 DIAGNOSIS — R102 Pelvic and perineal pain: Secondary | ICD-10-CM

## 2012-05-24 DIAGNOSIS — R269 Unspecified abnormalities of gait and mobility: Secondary | ICD-10-CM | POA: Diagnosis not present

## 2012-05-24 LAB — POCT URINALYSIS DIPSTICK
Bilirubin, UA: NEGATIVE
Blood, UA: NEGATIVE
Glucose, UA: NEGATIVE
Spec Grav, UA: 1.015

## 2012-05-24 MED ORDER — PHENAZOPYRIDINE HCL 100 MG PO TABS: 100.0000 mg | ORAL_TABLET | Freq: Three times a day (TID) | ORAL | Status: DC

## 2012-05-24 NOTE — Progress Notes (Signed)
CC: Claire Cox is a 78 y.o. female is here for bladder issues   Subjective: HPI:  Patient presents complaining of pelvic discomfort described as "bladder spasms" with occasional urgency sensations, and some mild dysuria at the top of her vagina. This improved she was taking Cipro over a month ago, slightly improved while on Augmentin last week, it has gotten worse this past week. New symptoms this week have included the sensation of having to urinate whenever sitting that's relieved with standing. The more fluid she drinks less her symptoms bother her but understandably the more she ends up urinating.  Without increasing her fluids she urinates 5-8 times a day. She has not had any accidents or leaking. She denies change in the odor color or consistency of her urine. She denies vaginal discharge or any vaginal bleeding. She denies any gastrointestinal discomfort or bowel irregularity.  She denies fevers, chills, abdominal pain, rectal pain, rashes, bruising, nor swollen lymph nodes.  She one time used vaginal estrogen but this is irritative and she's not been using this for years, she intermittently uses a topical steroid cream on her vagina but has not used it in the past week.  She's used home urinalysis tests that have shown leukocytes on multiple runs and even was nitrite positive yesterday.    Review Of Systems Outlined In HPI  Past Medical History  Diagnosis Date  . Thyroid nodule 01/19/2012    [place] 08/2011, right lob 6. Cm and left lobe 7.0 cm, lobulated mass in left lobe measuring 3.8 cm.  [place] FNA on 09/2011 sowing follicular proliferation of undetermined sig.  Repaet FNA on 10/2011: benign.     . Osteoporosis 01/19/2012    DEXA 08/2011.  T score spine -4.3.  Reclast 08/2011. She had GI upset and irritation with Fosamax. She declines any future Reclast infusions. She also declines any future oral agents.   . Hypothyroid 01/19/2012    Diagnosed February 2013.      No family history on file.    History  Substance Use Topics  . Smoking status: Former Games developer  . Smokeless tobacco: Not on file  . Alcohol Use: Not on file     Objective: Filed Vitals:   05/24/12 1306  BP: 159/74  Pulse: 89  Temp: 97.9 F (36.6 C)    General: Alert and Oriented, No Acute Distress Lungs: Clear to auscultation bilaterally, no wheezing/ronchi/rales.  Comfortable work of breathing. Good air movement. Cardiac: Regular rate and rhythm. Normal S1/S2.  No murmurs, rubs, nor gallops.   Abdomen: Soft nontender no palpable masses Genitourinary: Distal urethra has mild erythema, no lesions or abnormalities of the labia majora nor minora, vaginal wall is moist without lesions, external os of the cervix unremarkable. Bimanual exam could not reproduce the patient's discomfort and no palpable masses were felt. Mental Status: No depression, anxiety, nor agitation. Skin: Warm and dry.  Assessment & Plan: Claire Cox was seen today for bladder issues.  Diagnoses and associated orders for this visit:  Dysuria - Urine culture - POCT urinalysis dipstick - WET PREP FOR TRICH, YEAST, CLUE - GC/chlamydia probe amp, urine - phenazopyridine (PYRIDIUM) 100 MG tablet; Take 1 tablet (100 mg total) by mouth 3 (three) times daily. No longer than 3 days. - Ambulatory referral to Urology  Vaginal pain - WET PREP FOR TRICH, YEAST, CLUE - GC/chlamydia probe amp, urine   Repeat culture today, urinalysis unremarkable. We'll rule out GC and Chlamydia, Pyridium for symptom control for 3 days. If the culture is  negative I would like her to see urology for further evaluation.  Considering interstitial cystitis versus overactive bladder.  25 minutes spent in face-to-face visit today of which at least 90% was counseling or coordinating care.   Return if symptoms worsen or fail to improve.

## 2012-05-24 NOTE — Telephone Encounter (Signed)
Claire Cox called and stated she did not think she could take 3 Pyridium before she went to bed. I advised her she should take 1 tid about 8 hours apart if needed and that she did not need to take all 3 tabs today since it is already 4 pm. She voiced understanding.

## 2012-05-25 LAB — WET PREP, GENITAL
Clue Cells Wet Prep HPF POC: NONE SEEN
WBC, Wet Prep HPF POC: NONE SEEN
Yeast Wet Prep HPF POC: NONE SEEN

## 2012-05-25 LAB — GC/CHLAMYDIA PROBE AMP, URINE
Chlamydia, Swab/Urine, PCR: NEGATIVE
GC Probe Amp, Urine: NEGATIVE

## 2012-05-25 NOTE — Telephone Encounter (Signed)
Thank you very much Angela.

## 2012-05-26 ENCOUNTER — Telehealth: Payer: Self-pay | Admitting: Family Medicine

## 2012-05-26 DIAGNOSIS — R300 Dysuria: Secondary | ICD-10-CM | POA: Insufficient documentation

## 2012-05-26 NOTE — Telephone Encounter (Signed)
Claire Cox, Will you please let mrs. Claire Cox know that her urine culture from earlier in the week did not grow out any bacteria.  All the other studies we did that week showed no abnormalities.  I'm going to place a Urology referral to see if they can provide further testing for her bladder issues.  Thank you.

## 2012-05-26 NOTE — Telephone Encounter (Signed)
Pt notified; pt wanted you to know that she did have a tubal ligation that was done through the navel that failed. She then later had another tubal ligation in which they mad an incision right above the pubic area

## 2012-06-01 DIAGNOSIS — R269 Unspecified abnormalities of gait and mobility: Secondary | ICD-10-CM | POA: Diagnosis not present

## 2012-06-01 DIAGNOSIS — M6281 Muscle weakness (generalized): Secondary | ICD-10-CM | POA: Diagnosis not present

## 2012-06-01 DIAGNOSIS — M545 Low back pain: Secondary | ICD-10-CM | POA: Diagnosis not present

## 2012-06-02 DIAGNOSIS — R3915 Urgency of urination: Secondary | ICD-10-CM | POA: Diagnosis not present

## 2012-06-02 DIAGNOSIS — R300 Dysuria: Secondary | ICD-10-CM | POA: Diagnosis not present

## 2012-06-02 DIAGNOSIS — N390 Urinary tract infection, site not specified: Secondary | ICD-10-CM | POA: Diagnosis not present

## 2012-06-03 DIAGNOSIS — R269 Unspecified abnormalities of gait and mobility: Secondary | ICD-10-CM | POA: Diagnosis not present

## 2012-06-03 DIAGNOSIS — M545 Low back pain: Secondary | ICD-10-CM | POA: Diagnosis not present

## 2012-06-03 DIAGNOSIS — M6281 Muscle weakness (generalized): Secondary | ICD-10-CM | POA: Diagnosis not present

## 2012-06-09 DIAGNOSIS — R269 Unspecified abnormalities of gait and mobility: Secondary | ICD-10-CM | POA: Diagnosis not present

## 2012-06-09 DIAGNOSIS — M6281 Muscle weakness (generalized): Secondary | ICD-10-CM | POA: Diagnosis not present

## 2012-06-09 DIAGNOSIS — M545 Low back pain: Secondary | ICD-10-CM | POA: Diagnosis not present

## 2012-06-14 DIAGNOSIS — M6281 Muscle weakness (generalized): Secondary | ICD-10-CM | POA: Diagnosis not present

## 2012-06-14 DIAGNOSIS — M545 Low back pain: Secondary | ICD-10-CM | POA: Diagnosis not present

## 2012-06-14 DIAGNOSIS — R269 Unspecified abnormalities of gait and mobility: Secondary | ICD-10-CM | POA: Diagnosis not present

## 2012-06-16 DIAGNOSIS — N390 Urinary tract infection, site not specified: Secondary | ICD-10-CM | POA: Diagnosis not present

## 2012-06-16 DIAGNOSIS — R300 Dysuria: Secondary | ICD-10-CM | POA: Diagnosis not present

## 2012-06-21 ENCOUNTER — Telehealth: Payer: Self-pay | Admitting: *Deleted

## 2012-06-21 DIAGNOSIS — E039 Hypothyroidism, unspecified: Secondary | ICD-10-CM | POA: Diagnosis not present

## 2012-06-21 NOTE — Telephone Encounter (Signed)
Pt is due for 6 month lab/f/u for thyroid. See phone note 02/10/12. Faxed order for labs and pt will schedule a f/u appt with Dr. Ivan Anchors. (pt requested her PCP to be [facility].)

## 2012-06-22 ENCOUNTER — Encounter: Payer: Self-pay | Admitting: Family Medicine

## 2012-06-22 DIAGNOSIS — M545 Low back pain: Secondary | ICD-10-CM | POA: Diagnosis not present

## 2012-06-22 DIAGNOSIS — R269 Unspecified abnormalities of gait and mobility: Secondary | ICD-10-CM | POA: Diagnosis not present

## 2012-06-22 DIAGNOSIS — M6281 Muscle weakness (generalized): Secondary | ICD-10-CM | POA: Diagnosis not present

## 2012-06-22 LAB — THYROID PANEL WITH TSH: TSH: 4.102 u[IU]/mL (ref 0.350–4.500)

## 2012-06-24 ENCOUNTER — Encounter: Payer: Self-pay | Admitting: Family Medicine

## 2012-06-24 ENCOUNTER — Ambulatory Visit (INDEPENDENT_AMBULATORY_CARE_PROVIDER_SITE_OTHER): Payer: Medicare Other | Admitting: Family Medicine

## 2012-06-24 VITALS — BP 141/71 | HR 69 | Wt 123.0 lb

## 2012-06-24 DIAGNOSIS — E039 Hypothyroidism, unspecified: Secondary | ICD-10-CM | POA: Diagnosis not present

## 2012-06-24 DIAGNOSIS — N952 Postmenopausal atrophic vaginitis: Secondary | ICD-10-CM | POA: Insufficient documentation

## 2012-06-24 DIAGNOSIS — N3289 Other specified disorders of bladder: Secondary | ICD-10-CM | POA: Diagnosis not present

## 2012-06-24 MED ORDER — ESTRADIOL 0.1 MG/GM VA CREA: TOPICAL_CREAM | VAGINAL | Status: DC

## 2012-06-24 MED ORDER — LEVOTHYROXINE SODIUM 75 MCG PO TABS: 75.0000 ug | ORAL_TABLET | Freq: Every day | ORAL | Status: DC

## 2012-06-24 NOTE — Progress Notes (Signed)
CC: Claire Cox is a 78 y.o. female is here for f/u thyroid   Subjective: HPI:  Claire Cox  Patient presents for followup of hyperthyroidism: she denies unintentional weight loss or gain, tremor, anxiety, depression, GI disturbance, hair or skin changes. TSH was obtained earlier this week and was within normal limits. She's been on 75 mcg of levothyroxine without missed doses.  Patient has unfortunate news of a bladder mass been found on cystoscopy. Biopsy has not been taken yet but are planned for next week. She is quite anxious and tearful with this news, but is optimistic that may be explaining some of her dysuria.  At her urologist office she was prescribed Estrace  For vaginal atrophy which she's been applying to her vagina every other day for a week but hasn't noticed that much of an improvement.  She denies any change in color or odor or consistency of her urine, vaginal discharge, nor blood in the urine.  Review Of Systems Outlined In HPI  Past Medical History  Diagnosis Date  . Thyroid nodule 01/19/2012    [place] 08/2011, right lob 6. Cm and left lobe 7.0 cm, lobulated mass in left lobe measuring 3.8 cm.  [place] FNA on 09/2011 sowing follicular proliferation of undetermined sig.  Repaet FNA on 10/2011: benign.     . Osteoporosis 01/19/2012    DEXA 08/2011.  T score spine -4.3.  Reclast 08/2011. She had GI upset and irritation with Fosamax. She declines any future Reclast infusions. She also declines any future oral agents.   . Hypothyroid 01/19/2012    Diagnosed February 2013.      No family history on file.   History  Substance Use Topics  . Smoking status: Former Games developer  . Smokeless tobacco: Not on file  . Alcohol Use: Not on file     Objective: Filed Vitals:   06/24/12 1029  BP: 141/71  Pulse: 69    General: Alert and Oriented, No Acute Distress HEENT: Pupils equal, round, reactive to light. Conjunctivae clear.   Moist mucous membranes,  Neck supple without palpable lymphadenopathy  nor abnormal masses. Lungs: Clear to auscultation bilaterally, no wheezing/ronchi/rales.  Comfortable work of breathing. Good air movement. Cardiac: Regular rate and rhythm. Normal S1/S2.  No murmurs, rubs, nor gallops.   Abdomen: Soft nontender palpation  Mental Status: No depression, anxiety, nor agitation. Tearful during exam Skin: Warm and dry.  Assessment & Plan: Claire Cox was seen today for f/u thyroid.  Diagnoses and associated orders for this visit:  Hypothyroid - levothyroxine (SYNTHROID, LEVOTHROID) 75 MCG tablet; Take 1 tablet (75 mcg total) by mouth daily.  Vaginal atrophy - estradiol (ESTRACE VAGINAL) 0.1 MG/GM vaginal cream; Apply 1/4-1/2 applicator to vagina daily for one week, then three times a week afterwards.  Bladder mass    Hypothyroidism: Stable continue 75 mcg levothyroxine Bladder mass: She has biopsy planned in the next one to 2 weeks, provided empathy and brief counseling during this encounter Vaginal atrophy: Discussed appropriate use of Estrace for the first 1-2 weeks, and then a maintenance regimen thereafter, anticipate pelvic/vaginal discomfort following cystoscopy and future biopsy I've asked the patient to return in 4 weeks or once pathology is back from the biopsy for further counseling and support  25 minutes spent in face-to-face visit today of which at least 75% was counseling or coordinating care.   Return in about 4 weeks (around 07/22/2012).

## 2012-07-05 DIAGNOSIS — H409 Unspecified glaucoma: Secondary | ICD-10-CM | POA: Diagnosis not present

## 2012-07-05 DIAGNOSIS — Z883 Allergy status to other anti-infective agents status: Secondary | ICD-10-CM | POA: Diagnosis not present

## 2012-07-05 DIAGNOSIS — N3080 Other cystitis without hematuria: Secondary | ICD-10-CM | POA: Diagnosis not present

## 2012-07-05 DIAGNOSIS — M19049 Primary osteoarthritis, unspecified hand: Secondary | ICD-10-CM | POA: Diagnosis not present

## 2012-07-05 DIAGNOSIS — Z87891 Personal history of nicotine dependence: Secondary | ICD-10-CM | POA: Diagnosis not present

## 2012-07-05 DIAGNOSIS — Z8619 Personal history of other infectious and parasitic diseases: Secondary | ICD-10-CM | POA: Diagnosis not present

## 2012-07-05 DIAGNOSIS — D414 Neoplasm of uncertain behavior of bladder: Secondary | ICD-10-CM | POA: Diagnosis not present

## 2012-07-05 DIAGNOSIS — D494 Neoplasm of unspecified behavior of bladder: Secondary | ICD-10-CM | POA: Diagnosis not present

## 2012-07-05 DIAGNOSIS — I499 Cardiac arrhythmia, unspecified: Secondary | ICD-10-CM | POA: Diagnosis not present

## 2012-07-05 DIAGNOSIS — N3289 Other specified disorders of bladder: Secondary | ICD-10-CM | POA: Diagnosis not present

## 2012-07-05 DIAGNOSIS — K219 Gastro-esophageal reflux disease without esophagitis: Secondary | ICD-10-CM | POA: Diagnosis not present

## 2012-07-05 DIAGNOSIS — Z79899 Other long term (current) drug therapy: Secondary | ICD-10-CM | POA: Diagnosis not present

## 2012-07-05 DIAGNOSIS — Z87440 Personal history of urinary (tract) infections: Secondary | ICD-10-CM | POA: Diagnosis not present

## 2012-07-06 DIAGNOSIS — H409 Unspecified glaucoma: Secondary | ICD-10-CM | POA: Diagnosis not present

## 2012-07-06 DIAGNOSIS — D494 Neoplasm of unspecified behavior of bladder: Secondary | ICD-10-CM | POA: Diagnosis not present

## 2012-07-06 DIAGNOSIS — Z883 Allergy status to other anti-infective agents status: Secondary | ICD-10-CM | POA: Diagnosis not present

## 2012-07-06 DIAGNOSIS — K219 Gastro-esophageal reflux disease without esophagitis: Secondary | ICD-10-CM | POA: Diagnosis not present

## 2012-07-06 DIAGNOSIS — Z8619 Personal history of other infectious and parasitic diseases: Secondary | ICD-10-CM | POA: Diagnosis not present

## 2012-07-06 DIAGNOSIS — Z87891 Personal history of nicotine dependence: Secondary | ICD-10-CM | POA: Diagnosis not present

## 2012-07-07 DIAGNOSIS — K219 Gastro-esophageal reflux disease without esophagitis: Secondary | ICD-10-CM | POA: Diagnosis not present

## 2012-07-07 DIAGNOSIS — Z8619 Personal history of other infectious and parasitic diseases: Secondary | ICD-10-CM | POA: Diagnosis not present

## 2012-07-07 DIAGNOSIS — Z883 Allergy status to other anti-infective agents status: Secondary | ICD-10-CM | POA: Diagnosis not present

## 2012-07-07 DIAGNOSIS — H409 Unspecified glaucoma: Secondary | ICD-10-CM | POA: Diagnosis not present

## 2012-07-07 DIAGNOSIS — D494 Neoplasm of unspecified behavior of bladder: Secondary | ICD-10-CM | POA: Diagnosis not present

## 2012-07-07 DIAGNOSIS — Z87891 Personal history of nicotine dependence: Secondary | ICD-10-CM | POA: Diagnosis not present

## 2012-07-15 DIAGNOSIS — N390 Urinary tract infection, site not specified: Secondary | ICD-10-CM | POA: Diagnosis not present

## 2012-07-15 DIAGNOSIS — R300 Dysuria: Secondary | ICD-10-CM | POA: Diagnosis not present

## 2012-09-10 ENCOUNTER — Encounter: Payer: Self-pay | Admitting: Family Medicine

## 2012-09-10 ENCOUNTER — Ambulatory Visit (INDEPENDENT_AMBULATORY_CARE_PROVIDER_SITE_OTHER): Payer: Medicare Other | Admitting: Family Medicine

## 2012-09-10 VITALS — BP 134/66 | HR 70 | Ht 63.5 in | Wt 125.0 lb

## 2012-09-10 DIAGNOSIS — M7710 Lateral epicondylitis, unspecified elbow: Secondary | ICD-10-CM

## 2012-09-10 NOTE — Progress Notes (Signed)
CC: Claire Cox is a 79 y.o. female is here for left elbow pain   Subjective: HPI:  Patient planes of left elbow pain. Been present for one week. It started after she began a new exercise routine using her back and left arm. She describes it as a dull ache that is worse with wrist extension. It is intermittent and comes and goes without predictability other than above. When present it is mild in severity. It has not responded to heat or ice. She has not taken anything over-the-counter for pain. She denies swelling or redness nor warmth at the elbow. She denies shoulder or wrist pain. She denies trauma. She is never had this before. She denies motor or sensory disturbances in the left upper extremity.   Review Of Systems Outlined In HPI  Past Medical History  Diagnosis Date  . Thyroid nodule 01/19/2012    [place] 08/2011, right lob 6. Cm and left lobe 7.0 cm, lobulated mass in left lobe measuring 3.8 cm.  [place] FNA on 09/2011 sowing follicular proliferation of undetermined sig.  Repaet FNA on 10/2011: benign.     . Osteoporosis 01/19/2012    DEXA 08/2011.  T score spine -4.3.  Reclast 08/2011. She had GI upset and irritation with Fosamax. She declines any future Reclast infusions. She also declines any future oral agents.   . Hypothyroid 01/19/2012    Diagnosed February 2013.      No family history on file.   History  Substance Use Topics  . Smoking status: Former Games developer  . Smokeless tobacco: Not on file  . Alcohol Use: Not on file     Objective: Filed Vitals:   09/10/12 1419  BP: 134/66  Pulse: 70    General: Alert and Oriented, No Acute Distress HEENT: Pupils equal, round, reactive to light. Conjunctivae clear.  Moist mucous membranes Lungs: Clear work of breathing Cardiac: Regular rate and rhythm.  Extremities: No peripheral edema.  Strong peripheral pulses. Pain is reproduced with palpation of left lateral epicondyle of the humerus. Pain is reproduced with resisted left wrist flexion.  Full range of motion strength in the left upper extremity. Mental Status: No depression, anxiety, nor agitation. Skin: Warm and dry.  Assessment & Plan: Claire Cox was seen today for left elbow pain.  Diagnoses and associated orders for this visit:  Tennis elbow - Ambulatory referral to Physical Therapy    Discussed diagnosis of left tennis elbow and etiology of pain. Discussed nonsteroidal anti-inflammatory use and bracing, she would prefer to decline both of these but is quite interested in a home exercise plan and formal physical therapy. Handout provided for home therapy, referral placed for formal therapy.    Return if symptoms worsen or fail to improve.

## 2012-09-15 DIAGNOSIS — R300 Dysuria: Secondary | ICD-10-CM | POA: Diagnosis not present

## 2012-09-15 DIAGNOSIS — N390 Urinary tract infection, site not specified: Secondary | ICD-10-CM | POA: Diagnosis not present

## 2012-09-15 DIAGNOSIS — R3915 Urgency of urination: Secondary | ICD-10-CM | POA: Diagnosis not present

## 2012-09-20 DIAGNOSIS — M6281 Muscle weakness (generalized): Secondary | ICD-10-CM | POA: Diagnosis not present

## 2012-09-20 DIAGNOSIS — M25629 Stiffness of unspecified elbow, not elsewhere classified: Secondary | ICD-10-CM | POA: Diagnosis not present

## 2012-09-20 DIAGNOSIS — M25529 Pain in unspecified elbow: Secondary | ICD-10-CM | POA: Diagnosis not present

## 2012-09-22 DIAGNOSIS — M25529 Pain in unspecified elbow: Secondary | ICD-10-CM | POA: Diagnosis not present

## 2012-09-22 DIAGNOSIS — M6281 Muscle weakness (generalized): Secondary | ICD-10-CM | POA: Diagnosis not present

## 2012-09-22 DIAGNOSIS — M25629 Stiffness of unspecified elbow, not elsewhere classified: Secondary | ICD-10-CM | POA: Diagnosis not present

## 2012-09-27 DIAGNOSIS — M25629 Stiffness of unspecified elbow, not elsewhere classified: Secondary | ICD-10-CM | POA: Diagnosis not present

## 2012-09-27 DIAGNOSIS — M6281 Muscle weakness (generalized): Secondary | ICD-10-CM | POA: Diagnosis not present

## 2012-09-27 DIAGNOSIS — M25529 Pain in unspecified elbow: Secondary | ICD-10-CM | POA: Diagnosis not present

## 2012-09-28 DIAGNOSIS — H526 Other disorders of refraction: Secondary | ICD-10-CM | POA: Diagnosis not present

## 2012-09-28 DIAGNOSIS — H01009 Unspecified blepharitis unspecified eye, unspecified eyelid: Secondary | ICD-10-CM | POA: Diagnosis not present

## 2012-09-28 DIAGNOSIS — H18239 Secondary corneal edema, unspecified eye: Secondary | ICD-10-CM | POA: Diagnosis not present

## 2012-09-28 DIAGNOSIS — Z9849 Cataract extraction status, unspecified eye: Secondary | ICD-10-CM | POA: Diagnosis not present

## 2012-09-28 DIAGNOSIS — H26499 Other secondary cataract, unspecified eye: Secondary | ICD-10-CM | POA: Diagnosis not present

## 2012-10-01 DIAGNOSIS — M6281 Muscle weakness (generalized): Secondary | ICD-10-CM | POA: Diagnosis not present

## 2012-10-01 DIAGNOSIS — M25529 Pain in unspecified elbow: Secondary | ICD-10-CM | POA: Diagnosis not present

## 2012-10-01 DIAGNOSIS — M25629 Stiffness of unspecified elbow, not elsewhere classified: Secondary | ICD-10-CM | POA: Diagnosis not present

## 2012-10-04 DIAGNOSIS — M25529 Pain in unspecified elbow: Secondary | ICD-10-CM | POA: Diagnosis not present

## 2012-10-04 DIAGNOSIS — M25629 Stiffness of unspecified elbow, not elsewhere classified: Secondary | ICD-10-CM | POA: Diagnosis not present

## 2012-10-04 DIAGNOSIS — M6281 Muscle weakness (generalized): Secondary | ICD-10-CM | POA: Diagnosis not present

## 2012-10-20 DIAGNOSIS — M25629 Stiffness of unspecified elbow, not elsewhere classified: Secondary | ICD-10-CM | POA: Diagnosis not present

## 2012-10-20 DIAGNOSIS — M25529 Pain in unspecified elbow: Secondary | ICD-10-CM | POA: Diagnosis not present

## 2012-10-20 DIAGNOSIS — M6281 Muscle weakness (generalized): Secondary | ICD-10-CM | POA: Diagnosis not present

## 2012-10-25 DIAGNOSIS — M25529 Pain in unspecified elbow: Secondary | ICD-10-CM | POA: Diagnosis not present

## 2012-10-25 DIAGNOSIS — M25629 Stiffness of unspecified elbow, not elsewhere classified: Secondary | ICD-10-CM | POA: Diagnosis not present

## 2012-10-25 DIAGNOSIS — M6281 Muscle weakness (generalized): Secondary | ICD-10-CM | POA: Diagnosis not present

## 2012-10-27 DIAGNOSIS — M25629 Stiffness of unspecified elbow, not elsewhere classified: Secondary | ICD-10-CM | POA: Diagnosis not present

## 2012-10-27 DIAGNOSIS — M25529 Pain in unspecified elbow: Secondary | ICD-10-CM | POA: Diagnosis not present

## 2012-10-27 DIAGNOSIS — M6281 Muscle weakness (generalized): Secondary | ICD-10-CM | POA: Diagnosis not present

## 2012-10-29 ENCOUNTER — Ambulatory Visit (INDEPENDENT_AMBULATORY_CARE_PROVIDER_SITE_OTHER): Payer: Medicare Other | Admitting: Family Medicine

## 2012-10-29 ENCOUNTER — Other Ambulatory Visit: Payer: Self-pay | Admitting: Family Medicine

## 2012-10-29 VITALS — BP 149/69 | HR 68 | Wt 124.0 lb

## 2012-10-29 DIAGNOSIS — E039 Hypothyroidism, unspecified: Secondary | ICD-10-CM

## 2012-10-29 DIAGNOSIS — L659 Nonscarring hair loss, unspecified: Secondary | ICD-10-CM

## 2012-10-29 MED ORDER — AMBULATORY NON FORMULARY MEDICATION: Status: DC

## 2012-10-29 NOTE — Progress Notes (Signed)
CC: Claire Cox is a 79 y.o. female is here for possible side effect of medication   Subjective: HPI:  Patient complains of hair loss. This is been present for one month. It is occurring on a daily basis. She's not sure getting better or worse. It is occurring throughout the whole scalp, more so just above forehead. She denies hair loss elsewhere on the body. She denies any recent trauma, surgeries, hospitalizations, periods of high stress, nor change in medications. She's done some research and is worried that her levothyroxine may be contributing to this. She is interested in bioidentical hormone therapy which she has been researching on her own.  She denies treating her hair, she does appear to curl her hair therefore putting traction on the hair.  She denies unintentional weight loss, fevers, chills, nausea, vomiting, swollen lymph nodes, recent or remote chemotherapy, back pain, constipation, diarrhea, nor skin changes   Review Of Systems Outlined In HPI  Past Medical History  Diagnosis Date  . Thyroid nodule 01/19/2012    [place] 08/2011, right lob 6. Cm and left lobe 7.0 cm, lobulated mass in left lobe measuring 3.8 cm.  [place] FNA on 09/2011 sowing follicular proliferation of undetermined sig.  Repaet FNA on 10/2011: benign.     . Osteoporosis 01/19/2012    DEXA 08/2011.  T score spine -4.3.  Reclast 08/2011. She had GI upset and irritation with Fosamax. She declines any future Reclast infusions. She also declines any future oral agents.   . Hypothyroid 01/19/2012    Diagnosed February 2013.      No family history on file.   History  Substance Use Topics  . Smoking status: Former Games developer  . Smokeless tobacco: Not on file  . Alcohol Use: Not on file     Objective: Filed Vitals:   10/29/12 1458  BP: 149/69  Pulse: 68    Vital signs reviewed. General: Alert and Oriented, No Acute Distress HEENT: Pupils equal, round, reactive to light. Conjunctivae clear.  External ears unremarkable.   Moist mucous membranes. Lungs: Clear and comfortable work of breathing, speaking in full sentences without accessory muscle use. Cardiac: Regular rate and rhythm.  Neuro: CN II-XII grossly intact, gait normal. Extremities: No peripheral edema.  Strong peripheral pulses.  Mental Status: No depression, anxiety, nor agitation. Logical though process. Skin: Warm and dry. Scalp: Mild thinning of the hair diffusely throughout the scalp however most apparent at sites of traction from curling, pull test negative, no sites of absolute hair follicle loss.  Assessment & Plan: Reneshia was seen today for possible side effect of medication.  Diagnoses and associated orders for this visit:  Hypothyroidism - Thyroid Panel with TSH - AMBULATORY NON FORMULARY MEDICATION; Armour Thyroid: 3/4 Grain by mouth daily.  Dx: Hypothyroidism.  Hair loss     hair loss: Discussed differential including traction alopecia and thyroid abnormality being highest in the differential. She is quite adamant about switching off of levothyroxine with a large interest in biochemical hormone therapy including Armour Thyroid. We spent quite a bit of time discussing risks and benefits of compounded formulations of medications when compared to products such as Synthroid or levothyroxine.  Her decision was to go with Armour Thyroid, discussed case with [facility] pharmacist and will send a prescription. Baseline thyroid studies today  25 minutes spent face-to-face during visit today of which at least 50% was counseling or coordinating care regarding hypothyroidism, hair loss.   Return in about 4 weeks (around 11/26/2012).

## 2012-10-30 LAB — TSH: TSH: 3.245 u[IU]/mL (ref 0.350–4.500)

## 2012-11-01 ENCOUNTER — Encounter: Payer: Self-pay | Admitting: Family Medicine

## 2012-11-01 ENCOUNTER — Ambulatory Visit (INDEPENDENT_AMBULATORY_CARE_PROVIDER_SITE_OTHER): Payer: Medicare Other | Admitting: Family Medicine

## 2012-11-01 VITALS — BP 121/62 | HR 78 | Ht 63.5 in | Wt 123.0 lb

## 2012-11-01 DIAGNOSIS — E039 Hypothyroidism, unspecified: Secondary | ICD-10-CM

## 2012-11-01 NOTE — Patient Instructions (Addendum)
  SYMPTOMS  Lethargy (feeling as though you have no energy)  Cold intolerance  Weight gain (in spite of normal food intake)  Dry skin  Coarse hair  Menstrual irregularity (if severe, may lead to infertility)  Slowing of thought processes Cardiac problems are also caused by insufficient amounts of thyroid hormone. Hypothyroidism in the newborn is cretinism, and is an extreme form. It is important that this form be treated adequately and immediately or it will lead rapidly to retarded physical and mental development. DIAGNOSIS  To prove hypothyroidism, your caregiver may do blood tests and ultrasound tests. Sometimes the signs are hidden. It may be necessary for your caregiver to watch this illness with blood tests either before or after diagnosis and treatment. TREATMENT  Low levels of thyroid hormone are increased by using synthetic thyroid hormone. This is a safe, effective treatment. It usually takes about four weeks to gain the full effects of the medication. After you have the full effect of the medication, it will generally take another four weeks for problems to leave. Your caregiver may start you on low doses. If you have had heart problems the dose may be gradually increased. It is generally not an emergency to get rapidly to normal.

## 2012-11-01 NOTE — Progress Notes (Signed)
CC: Claire Cox is a 79 y.o. female is here for discuss medication   Subjective: HPI:  Patient presents for followup of hypothyroidism: She would like to update me that since taking half of a 75 mcg tablet of levothyroxine daily she has noticed a great increase in her subjective overall sense of wellbeing.  She reports slightly improved lack of hair loss, absolutely completely resolved chronic bladder spasms, and just a sense of feeling better physically since this change. This change has only involved the past 3 days. She is having second thoughts about starting bioidentical thyroid replacement.  She has many questions about signs and symptoms of hyper and hypo-thyroidism.  Many questions involving benefits and risks of under oroverreatment with thyroid replacement therapy.  Denies weight gain, weight loss, palpitations, anxiety, tremor, depression, constipation, diarrhea, hot flashes, cold intolerance  Review Of Systems Outlined In HPI  Past Medical History  Diagnosis Date  . Thyroid nodule 01/19/2012    [place] 08/2011, right lob 6. Cm and left lobe 7.0 cm, lobulated mass in left lobe measuring 3.8 cm.  [place] FNA on 09/2011 sowing follicular proliferation of undetermined sig.  Repaet FNA on 10/2011: benign.     . Osteoporosis 01/19/2012    DEXA 08/2011.  T score spine -4.3.  Reclast 08/2011. She had GI upset and irritation with Fosamax. She declines any future Reclast infusions. She also declines any future oral agents.   . Hypothyroid 01/19/2012    Diagnosed February 2013.      No family history on file.   History  Substance Use Topics  . Smoking status: Former Games developer  . Smokeless tobacco: Not on file  . Alcohol Use: Not on file     Objective: Filed Vitals:   11/01/12 1403  BP: 121/62  Pulse: 78    Vital signs reviewed. General: Alert and Oriented, No Acute Distress HEENT: Pupils equal, round, reactive to light. Conjunctivae clear.  External ears unremarkable.  Moist mucous  membranes. Lungs: Clear and comfortable work of breathing, speaking in full sentences without accessory muscle use. Cardiac: Regular rate and rhythm.  Neuro: CN II-XII grossly intact, gait normal. Extremities: No peripheral edema.  Strong peripheral pulses.  Mental Status: No depression, anxiety, nor agitation. Logical though process. Skin: Warm and dry.  Assessment & Plan: Claire Cox was seen today for discuss medication.  Diagnoses and associated orders for this visit:  Hypothyroid    Hyperthyroidism: Clinically controlled, patient expresses a great interest in possibly stopping thyroid replacement therapy. I have asked her first to take half of a 75 mcg tablet for the next week. If she's feeling well on this regimen we can consider checking a TSH in 2-3 months. She would like to include stopping this replacement therapy completely, we discussed risks of under treated hypothyroidism and spend quite some time discussing symptoms and signs to look out for. She believes she'll hold off on starting Armour thyroid  25 minutes spent face-to-face during visit today of which at least 50% was counseling or coordinating care regarding hypothyroidism.   Return in about 4 weeks (around 11/29/2012).

## 2012-11-02 DIAGNOSIS — M25529 Pain in unspecified elbow: Secondary | ICD-10-CM | POA: Diagnosis not present

## 2012-11-02 DIAGNOSIS — M25629 Stiffness of unspecified elbow, not elsewhere classified: Secondary | ICD-10-CM | POA: Diagnosis not present

## 2012-11-02 DIAGNOSIS — M6281 Muscle weakness (generalized): Secondary | ICD-10-CM | POA: Diagnosis not present

## 2012-11-04 DIAGNOSIS — M6281 Muscle weakness (generalized): Secondary | ICD-10-CM | POA: Diagnosis not present

## 2012-11-04 DIAGNOSIS — M25629 Stiffness of unspecified elbow, not elsewhere classified: Secondary | ICD-10-CM | POA: Diagnosis not present

## 2012-11-04 DIAGNOSIS — M25529 Pain in unspecified elbow: Secondary | ICD-10-CM | POA: Diagnosis not present

## 2012-11-10 DIAGNOSIS — M25629 Stiffness of unspecified elbow, not elsewhere classified: Secondary | ICD-10-CM | POA: Diagnosis not present

## 2012-11-10 DIAGNOSIS — M25529 Pain in unspecified elbow: Secondary | ICD-10-CM | POA: Diagnosis not present

## 2012-11-10 DIAGNOSIS — M6281 Muscle weakness (generalized): Secondary | ICD-10-CM | POA: Diagnosis not present

## 2012-11-15 ENCOUNTER — Ambulatory Visit: Payer: Medicare Other | Admitting: Family Medicine

## 2012-11-19 ENCOUNTER — Encounter: Payer: Self-pay | Admitting: Family Medicine

## 2012-11-19 ENCOUNTER — Ambulatory Visit (INDEPENDENT_AMBULATORY_CARE_PROVIDER_SITE_OTHER): Payer: Medicare Other | Admitting: Family Medicine

## 2012-11-19 VITALS — BP 119/61 | HR 74 | Wt 121.0 lb

## 2012-11-19 DIAGNOSIS — E039 Hypothyroidism, unspecified: Secondary | ICD-10-CM

## 2012-11-19 DIAGNOSIS — Z630 Problems in relationship with spouse or partner: Secondary | ICD-10-CM

## 2012-11-19 NOTE — Progress Notes (Signed)
CC: Claire Cox is a 79 y.o. female is here for f/u thyroid   Subjective: HPI:  Patient presents with questions about hypothyroidism. She has stopped all thyroid supplementation and has not started Armour Thyroid. She reports even more increased energy and overall sense of well-being since stopping levothyroxine and starting over-the-counter kelp extract. She reports improvement with her irritability since stopping the above. She denies hair loss, skin changes, constipation, diarrhea, unintentional weight loss or gain, irregular heartbeat, fatigue, anxiety, anxiousness, nor chest pain.  She has additional questions beyond that we discussed last visit regarding benefits of thyroid supplementation.  Patient complains that her husband has been more irritability. This has been going on for months. Seems to be correlated with his poor sleep patterns. We recently started Rozerem and he had 3 days of seeming withdrawn and distracted however she and he had noticed great improvement of sleep cycle. She notices the more sleepy gets the less irritable he is. Irritability is mildly getting in the way of their fantastic relationship. She has questions about how she can help his irritability   Review Of Systems Outlined In HPI  Past Medical History  Diagnosis Date  . Thyroid nodule 01/19/2012    [place] 08/2011, right lob 6. Cm and left lobe 7.0 cm, lobulated mass in left lobe measuring 3.8 cm.  [place] FNA on 09/2011 sowing follicular proliferation of undetermined sig.  Repaet FNA on 10/2011: benign.     . Osteoporosis 01/19/2012    DEXA 08/2011.  T score spine -4.3.  Reclast 08/2011. She had GI upset and irritation with Fosamax. She declines any future Reclast infusions. She also declines any future oral agents.   . Hypothyroid 01/19/2012    Diagnosed February 2013.      No family history on file.   History  Substance Use Topics  . Smoking status: Former Games developer  . Smokeless tobacco: Not on file  . Alcohol Use: Not  on file     Objective: Filed Vitals:   11/19/12 1306  BP: 119/61  Pulse: 74   Vital signs reviewed. General: Alert and Oriented, No Acute Distress HEENT: Pupils equal, round, reactive to light. Conjunctivae clear.  External ears unremarkable.  Moist mucous membranes. No thyromegaly Lungs: Clear and comfortable work of breathing, speaking in full sentences without accessory muscle use. Cardiac: Regular rate and rhythm.  Neuro: CN II-XII grossly intact, gait normal. Extremities: No peripheral edema.  Strong peripheral pulses.  Mental Status: No depression, anxiety, nor agitation. Logical though process. Skin: Warm and dry.  Assessment & Plan: Sheldon was seen today for f/u thyroid.  Diagnoses and associated orders for this visit:  Hypothyroid  Marital problem    Hypothyroidism: Clinically stable, provided patient with time and answers to questions regarding benefits of over or under dosing of thyroid supplementation. We'll check TSH in 2 months Marital problem: Discussed my optimism that irritability will improve with improved sleep schedule, encourage patient to make no major changes in their relationship at the current time 25 minutes spent face-to-face during visit today of which at least 50% was counseling or coordinating care regarding hypothyroidism, marital problem.   Return in about 2 months (around 01/19/2013).

## 2013-01-24 ENCOUNTER — Ambulatory Visit: Payer: Medicare Other | Admitting: Sports Medicine

## 2013-01-25 ENCOUNTER — Ambulatory Visit (INDEPENDENT_AMBULATORY_CARE_PROVIDER_SITE_OTHER): Payer: Medicare Other | Admitting: Family Medicine

## 2013-01-25 ENCOUNTER — Encounter: Payer: Self-pay | Admitting: Family Medicine

## 2013-01-25 VITALS — BP 136/71 | HR 73 | Wt 116.0 lb

## 2013-01-25 DIAGNOSIS — E039 Hypothyroidism, unspecified: Secondary | ICD-10-CM | POA: Diagnosis not present

## 2013-01-25 NOTE — Progress Notes (Signed)
CC: Claire Cox is a 79 y.o. female is here for recheck thyroid   Subjective: HPI:  Followup hypothyroidism: It has been over 2 months since she was taking levothyroxine. Since I saw her last she has not been taking any thyroid supplementation. She is taking Kelp/iodine tablets on a daily basis. She reports hair on the scalp legs and pubic area has returned since stopping levothyroxine, additionally she reports increased subjective energy, and improvement in overall mood. She believes her thyroid may be somewhat larger in volume since starting iodine but denies nodularity, trouble swallowing, change in voice, nor breathing difficulty.  She denies hair or skin changes other than above, she has lost 5 pounds intentionally with physical activity, she denies unintentional weight gain or loss, she denies constipation or diarrhea. She denies tremor anxiety or depression   Review Of Systems Outlined In HPI  Past Medical History  Diagnosis Date  . Thyroid nodule 01/19/2012    [place] 08/2011, right lob 6. Cm and left lobe 7.0 cm, lobulated mass in left lobe measuring 3.8 cm.  [place] FNA on 09/2011 sowing follicular proliferation of undetermined sig.  Repaet FNA on 10/2011: benign.     . Osteoporosis 01/19/2012    DEXA 08/2011.  T score spine -4.3.  Reclast 08/2011. She had GI upset and irritation with Fosamax. She declines any future Reclast infusions. She also declines any future oral agents.   . Hypothyroid 01/19/2012    Diagnosed February 2013.      No family history on file.   History  Substance Use Topics  . Smoking status: Former Games developer  . Smokeless tobacco: Not on file  . Alcohol Use: Not on file     Objective: Filed Vitals:   01/25/13 0921  BP: 136/71  Pulse: 73    Vital signs reviewed. General: Alert and Oriented, No Acute Distress HEENT: Pupils equal, round, reactive to light. Conjunctivae clear.  External ears unremarkable.  Moist mucous membranes. No palpable thyromegaly Lungs: Clear and  comfortable work of breathing, speaking in full sentences without accessory muscle use. No wheezing rhonchi nor rales Cardiac: Regular rate and rhythm. No murmurs Neuro: CN II-XII grossly intact, gait normal. Extremities: No peripheral edema.  Strong peripheral pulses.  Mental Status: No depression, anxiety, nor agitation. Logical though process. Skin: Warm and dry.  Assessment & Plan: Claire Cox was seen today for recheck thyroid.  Diagnoses and associated orders for this visit:  Hypothyroid - TSH    Hypothyroidism: Clinically this sounds controlled on iodine supplementation, we will check a TSH today and if remains normal will continue only iodine therapy no restart in levothyroxine given such a improvement in subjective symptoms  Return in about 3 months (around 04/27/2013) for Thyroid F/U.

## 2013-01-28 ENCOUNTER — Encounter: Payer: Self-pay | Admitting: Family Medicine

## 2013-01-31 ENCOUNTER — Ambulatory Visit (INDEPENDENT_AMBULATORY_CARE_PROVIDER_SITE_OTHER): Payer: Medicare Other | Admitting: Family Medicine

## 2013-01-31 ENCOUNTER — Encounter: Payer: Self-pay | Admitting: Family Medicine

## 2013-01-31 VITALS — BP 148/75 | HR 78 | Wt 118.0 lb

## 2013-01-31 DIAGNOSIS — R350 Frequency of micturition: Secondary | ICD-10-CM | POA: Diagnosis not present

## 2013-01-31 LAB — POCT URINALYSIS DIPSTICK
Blood, UA: NEGATIVE
Glucose, UA: NEGATIVE
Ketones, UA: NEGATIVE
Protein, UA: NEGATIVE
Spec Grav, UA: 1.02
Urobilinogen, UA: 0.2

## 2013-01-31 MED ORDER — CIPROFLOXACIN HCL 250 MG PO TABS: ORAL_TABLET | ORAL | Status: AC

## 2013-01-31 NOTE — Progress Notes (Signed)
CC: Claire Cox is a 79 y.o. female is here for Urinary Frequency   Subjective: HPI:  Patient complains of urinary frequency that awoke her at night last night urinating every one to 2 hours, voiding volume less than anticipated at each voiding. Symptoms were moderate in severity interventions included cranberry tablets she is now symptom free. Other than above nothing has made better or worse. She denies fevers, chills, dysuria, pelvic pain, flank pain, change in odor color or consistency of urine.   Review Of Systems Outlined In HPI  Past Medical History  Diagnosis Date  . Thyroid nodule 01/19/2012    [place] 08/2011, right lob 6. Cm and left lobe 7.0 cm, lobulated mass in left lobe measuring 3.8 cm.  [place] FNA on 09/2011 sowing follicular proliferation of undetermined sig.  Repaet FNA on 10/2011: benign.     . Osteoporosis 01/19/2012    DEXA 08/2011.  T score spine -4.3.  Reclast 08/2011. She had GI upset and irritation with Fosamax. She declines any future Reclast infusions. She also declines any future oral agents.   . Hypothyroid 01/19/2012    Diagnosed February 2013.      No family history on file.   History  Substance Use Topics  . Smoking status: Former Games developer  . Smokeless tobacco: Not on file  . Alcohol Use: Not on file     Objective: Filed Vitals:   01/31/13 1307  BP: 148/75  Pulse: 78    Vital signs reviewed. General: Alert and Oriented, No Acute Distress Moist mucous membranes Lungs: Clear and comfortable work of breathing, speaking in full sentences without accessory muscle use. Cardiac: Regular rate and rhythm.  Neuro: CN II-XII grossly intact, gait normal. Extremities: No peripheral edema.  Strong peripheral pulses.  Mental Status: No depression, anxiety, nor agitation. Logical though process. Skin: Warm and dry.  Assessment & Plan: Claire Cox was seen today for urinary frequency.  Diagnoses and associated orders for this visit:  Urinary frequency - Urinalysis  Dipstick - Urine Culture - ciprofloxacin (CIPRO) 250 MG tablet; Take one by mouth twice a day for five days.    Urinary frequency without abnormality on urinalysis, if she remains symptom free I would like her to hold off on filling Cipro prescription we will await culture before further recommendations as she may have had a self-limited urinary tract infection.  Return if symptoms worsen or fail to improve.

## 2013-02-02 LAB — URINE CULTURE
Colony Count: NO GROWTH
Organism ID, Bacteria: NO GROWTH

## 2013-05-03 ENCOUNTER — Ambulatory Visit (INDEPENDENT_AMBULATORY_CARE_PROVIDER_SITE_OTHER): Payer: Medicare Other | Admitting: Family Medicine

## 2013-05-03 ENCOUNTER — Encounter: Payer: Self-pay | Admitting: Family Medicine

## 2013-05-03 VITALS — BP 139/75 | HR 71 | Wt 116.0 lb

## 2013-05-03 DIAGNOSIS — E039 Hypothyroidism, unspecified: Secondary | ICD-10-CM | POA: Diagnosis not present

## 2013-05-03 DIAGNOSIS — M6530 Trigger finger, unspecified finger: Secondary | ICD-10-CM

## 2013-05-03 DIAGNOSIS — L309 Dermatitis, unspecified: Secondary | ICD-10-CM

## 2013-05-03 DIAGNOSIS — L259 Unspecified contact dermatitis, unspecified cause: Secondary | ICD-10-CM

## 2013-05-03 MED ORDER — TRIAMCINOLONE ACETONIDE 0.1 % EX CREA: TOPICAL_CREAM | CUTANEOUS | Status: DC

## 2013-05-03 NOTE — Progress Notes (Signed)
CC: Genowefa Morga is a 79 y.o. female is here for f/u thyroid   Subjective: HPI:  Followup hypothyroidism: Since stopping levothyroxine approximately 5 months ago patient reports she continues to have increased energy, return of hair that was falling out, more regular bowel habits, a better outlook on life, and overall improved quality of life. She denies depression, anxiety, mental disturbance, sleep disturbance, nonrestorative sleep. In fact she is been actively losing weight with a daily exercise routine.  She denies neck swelling, dysphagia. She continues to take to help as a iodine supplement.  She is still quite adamant about avoiding any thyroid replacement therapy.  Patient complains of the small finger on her left hand being stuck in a flexed position this is been present for the past 3-4 weeks. The first week she was able to massage into a extended position however now it seems to be stuck. She denies any pain in the hand or in that finger. She denies any recent or remote trauma. She denies any sensory disturbance in the hand.  She complains of irritation on the radial surface of the volar first index finger that has been present for the last 3 months. It seems to be worse the more she uses her hands with crafts or with yard work. He use to improve with moisturizing creams however nothing now seems to make better. Is described as a soreness and redness present on a daily basis, severity is mild in severity. There is mild swelling but she denies discharge, warmth, nor joint pain   Review Of Systems Outlined In HPI  Past Medical History  Diagnosis Date  . Thyroid nodule 01/19/2012    [place] 08/2011, right lob 6. Cm and left lobe 7.0 cm, lobulated mass in left lobe measuring 3.8 cm.  [place] FNA on 09/2011 sowing follicular proliferation of undetermined sig.  Repaet FNA on 10/2011: benign.     . Osteoporosis 01/19/2012    DEXA 08/2011.  T score spine -4.3.  Reclast 08/2011. She had GI upset and irritation  with Fosamax. She declines any future Reclast infusions. She also declines any future oral agents.   . Hypothyroid 01/19/2012    Diagnosed February 2013.      History reviewed. No pertinent family history.   History  Substance Use Topics  . Smoking status: Former Games developer  . Smokeless tobacco: Not on file  . Alcohol Use: Not on file     Objective: Filed Vitals:   05/03/13 1000  BP: 139/75  Pulse: 71    General: Alert and Oriented, No Acute Distress HEENT: Pupils equal, round, reactive to light. Conjunctivae clear.    Moist mucous membranes, pharynx without inflammation nor lesions.  Neck supple without palpable lymphadenopathy nor abnormal masses. Lungs: Clear to auscultation bilaterally, no wheezing/ronchi/rales.  Comfortable work of breathing. Good air movement. Cardiac: Regular rate and rhythm. Normal S1/S2.  No murmurs, rubs, nor gallops.   Extremities: No peripheral edema.  Strong peripheral pulses. Left fifth digit is locked in a flexed position with a palpable nodule overlying the dorsal MCP, the right index finger on the radial surface is slightly hyperkeratotic and firm without joint pain and fortunately she has full range of motion and strength in his entire hand Mental Status: No depression, anxiety, nor agitation. Skin: Warm and dry.  Assessment & Plan: Arica was seen today for f/u thyroid.  Diagnoses and associated orders for this visit:  Left trigger finger  Dermatitis - Discontinue: triamcinolone cream (KENALOG) 0.1 %; Apply to right  small finger, twice a day as needed for irritation. - triamcinolone cream (KENALOG) 0.1 %; Apply to right small finger, twice a day as needed for irritation.  Hypothyroidism - TSH    Left trigger finger: I have refer her to Dr. Karie Schwalbe. in sports medicine for consideration of tendon sheath injection Dermatitis: Start triamcinolone on right index finger to help with irritation due to contact dermatitis Hypothyroidism: Clinically  controlled and improved we will obtain a TSH to help guide her on the severity of her underactive thyroid  25 minutes spent face-to-face during visit today of which at least 50% was counseling or coordinating care regarding hypothyroidism, dermatitis, left trigger finger   Return in about 3 months (around 08/03/2013).

## 2013-05-05 ENCOUNTER — Telehealth: Payer: Self-pay | Admitting: *Deleted

## 2013-05-05 NOTE — Telephone Encounter (Signed)
Pt.notified

## 2013-05-05 NOTE — Telephone Encounter (Signed)
Pt wanted you to know that she  found out she was taking  too small of amount kelp. She should have been taking 3/4 more. Pt is considering trying some iodine plus immino acids at [employer]. She wants to know if you think this would be okl and how long should try these supplements and if she does when should she come back for f/u. Also she wanted to let you know that she is seeing Dr. Karie Schwalbe on Friday

## 2013-05-05 NOTE — Telephone Encounter (Signed)
I can't give any specific recommendations on the amount of kelp she should be taking since I don't have access to any clinical trials using it for hypothyroidism.  However, I'd suggest using only enough that's needed to ward off her prior fatigue, hair loss, sluggishness, and body aches.  I'd say come back in 3 months for TSH check but sooner if anything else comes up.

## 2013-05-06 ENCOUNTER — Encounter: Payer: Self-pay | Admitting: Sports Medicine

## 2013-05-06 ENCOUNTER — Ambulatory Visit (INDEPENDENT_AMBULATORY_CARE_PROVIDER_SITE_OTHER): Payer: Medicare Other | Admitting: Sports Medicine

## 2013-05-06 ENCOUNTER — Ambulatory Visit (INDEPENDENT_AMBULATORY_CARE_PROVIDER_SITE_OTHER): Payer: Medicare Other

## 2013-05-06 VITALS — BP 120/62 | HR 68 | Wt 118.0 lb

## 2013-05-06 DIAGNOSIS — M20099 Other deformity of finger(s), unspecified finger(s): Secondary | ICD-10-CM

## 2013-05-06 DIAGNOSIS — M79645 Pain in left finger(s): Secondary | ICD-10-CM

## 2013-05-06 DIAGNOSIS — M79609 Pain in unspecified limb: Secondary | ICD-10-CM | POA: Diagnosis not present

## 2013-05-06 DIAGNOSIS — M20009 Unspecified deformity of unspecified finger(s): Secondary | ICD-10-CM | POA: Diagnosis not present

## 2013-05-06 DIAGNOSIS — M720 Palmar fascial fibromatosis [Dupuytren]: Secondary | ICD-10-CM | POA: Insufficient documentation

## 2013-05-06 NOTE — Assessment & Plan Note (Addendum)
Symptoms most likely represent Dupuytren's contracture, I did inject her flexor tendon sheath today. I would like her to work with [facility] on improving the range of motion in her finger, if no improvement at the next visit, I would like her to see hand surgery for consideration of collagenase injections for Dupuytren's contracture.

## 2013-05-06 NOTE — Progress Notes (Signed)
   Subjective:    I'm seeing this patient as a consultation for:  Dr. Ivan Anchors  CC: Finger pain  HPI: This is a very pleasant 79 year old female, decades ago she had a fracture of her left pinky, she did well afterwards, she also has a strong family history of Dupuytren's contracture. Approximately 5 months ago she started noting a drawing up of her right pinky, to the point where she was unable to straighten it out. She does have significant pain over her palm. She was referred to me for further evaluation and definitive treatment.  Past medical history, Surgical history, Family history not pertinant except as noted below, Social history, Allergies, and medications have been entered into the medical record, reviewed, and no changes needed.   Review of Systems: No headache, visual changes, nausea, vomiting, diarrhea, constipation, dizziness, abdominal pain, skin rash, fevers, chills, night sweats, weight loss, swollen lymph nodes, body aches, joint swelling, muscle aches, chest pain, shortness of breath, mood changes, visual or auditory hallucinations.   Objective:   General: Well Developed, well nourished, and in no acute distress.  Neuro/Psych: Alert and oriented x3, extra-ocular muscles intact, able to move all 4 extremities, sensation grossly intact. Skin: Warm and dry, no rashes noted.  Respiratory: Not using accessory muscles, speaking in full sentences, trachea midline.  Cardiovascular: Pulses palpable, no extremity edema. Abdomen: Does not appear distended. Left hand: The pinky is kept in flexion at the proximal and distal interphalangeal joints. She has restricted motion at the metacarpophalangeal joint. I am unable to straighten out the pinky passively. There is tenderness to palpation over the flexor tendon sheath.  X-rays were reviewed and are negative with the exception of the finger held in flexion at the PIP and DIP joints.  Procedure: Real-time Ultrasound Guided Injection of  left fifth flexor tendon sheath Device: GE Logiq E  Verbal informed consent obtained.  Time-out conducted.  Noted no overlying erythema, induration, or other signs of local infection.  Skin prepped in a sterile fashion.  Local anesthesia: Topical Ethyl chloride.  With sterile technique and under real time ultrasound guidance:  0.5 cc Kenalog 40, 0.5 cc lidocaine injected easily into the tendon sheath. I was still unable to extend the finger after the injection. Completed without difficulty  Pain immediately resolved suggesting accurate placement of the medication.  Advised to call if fevers/chills, erythema, induration, drainage, or persistent bleeding.  Images permanently stored and available for review in the ultrasound unit.  Impression: Technically successful ultrasound guided injection.  Impression and Recommendations:   This case required medical decision making of moderate complexity.

## 2013-05-19 DIAGNOSIS — M25649 Stiffness of unspecified hand, not elsewhere classified: Secondary | ICD-10-CM | POA: Diagnosis not present

## 2013-05-19 DIAGNOSIS — M6530 Trigger finger, unspecified finger: Secondary | ICD-10-CM | POA: Diagnosis not present

## 2013-05-19 DIAGNOSIS — M6281 Muscle weakness (generalized): Secondary | ICD-10-CM | POA: Diagnosis not present

## 2013-05-24 DIAGNOSIS — H1189 Other specified disorders of conjunctiva: Secondary | ICD-10-CM | POA: Diagnosis not present

## 2013-05-24 DIAGNOSIS — Z9849 Cataract extraction status, unspecified eye: Secondary | ICD-10-CM | POA: Diagnosis not present

## 2013-05-31 DIAGNOSIS — M6530 Trigger finger, unspecified finger: Secondary | ICD-10-CM | POA: Diagnosis not present

## 2013-05-31 DIAGNOSIS — M25649 Stiffness of unspecified hand, not elsewhere classified: Secondary | ICD-10-CM | POA: Diagnosis not present

## 2013-05-31 DIAGNOSIS — M6281 Muscle weakness (generalized): Secondary | ICD-10-CM | POA: Diagnosis not present

## 2013-06-02 DIAGNOSIS — M25649 Stiffness of unspecified hand, not elsewhere classified: Secondary | ICD-10-CM | POA: Diagnosis not present

## 2013-06-02 DIAGNOSIS — M6530 Trigger finger, unspecified finger: Secondary | ICD-10-CM | POA: Diagnosis not present

## 2013-06-02 DIAGNOSIS — M6281 Muscle weakness (generalized): Secondary | ICD-10-CM | POA: Diagnosis not present

## 2013-06-03 ENCOUNTER — Ambulatory Visit: Payer: Medicare Other | Admitting: Sports Medicine

## 2013-06-07 DIAGNOSIS — M6530 Trigger finger, unspecified finger: Secondary | ICD-10-CM | POA: Diagnosis not present

## 2013-06-07 DIAGNOSIS — M25649 Stiffness of unspecified hand, not elsewhere classified: Secondary | ICD-10-CM | POA: Diagnosis not present

## 2013-06-07 DIAGNOSIS — M6281 Muscle weakness (generalized): Secondary | ICD-10-CM | POA: Diagnosis not present

## 2013-06-09 DIAGNOSIS — M6281 Muscle weakness (generalized): Secondary | ICD-10-CM | POA: Diagnosis not present

## 2013-06-09 DIAGNOSIS — M25649 Stiffness of unspecified hand, not elsewhere classified: Secondary | ICD-10-CM | POA: Diagnosis not present

## 2013-06-09 DIAGNOSIS — M6530 Trigger finger, unspecified finger: Secondary | ICD-10-CM | POA: Diagnosis not present

## 2013-06-14 DIAGNOSIS — M25649 Stiffness of unspecified hand, not elsewhere classified: Secondary | ICD-10-CM | POA: Diagnosis not present

## 2013-06-14 DIAGNOSIS — M6281 Muscle weakness (generalized): Secondary | ICD-10-CM | POA: Diagnosis not present

## 2013-06-14 DIAGNOSIS — M6530 Trigger finger, unspecified finger: Secondary | ICD-10-CM | POA: Diagnosis not present

## 2013-06-21 DIAGNOSIS — M25649 Stiffness of unspecified hand, not elsewhere classified: Secondary | ICD-10-CM | POA: Diagnosis not present

## 2013-06-21 DIAGNOSIS — M6530 Trigger finger, unspecified finger: Secondary | ICD-10-CM | POA: Diagnosis not present

## 2013-06-21 DIAGNOSIS — M6281 Muscle weakness (generalized): Secondary | ICD-10-CM | POA: Diagnosis not present

## 2013-06-24 ENCOUNTER — Ambulatory Visit (INDEPENDENT_AMBULATORY_CARE_PROVIDER_SITE_OTHER): Payer: Medicare Other | Admitting: Sports Medicine

## 2013-06-24 ENCOUNTER — Encounter: Payer: Self-pay | Admitting: Sports Medicine

## 2013-06-24 ENCOUNTER — Telehealth: Payer: Self-pay | Admitting: *Deleted

## 2013-06-24 VITALS — BP 143/71 | HR 70 | Wt 119.0 lb

## 2013-06-24 DIAGNOSIS — M720 Palmar fascial fibromatosis [Dupuytren]: Secondary | ICD-10-CM

## 2013-06-24 MED ORDER — AMBULATORY NON FORMULARY MEDICATION: Status: DC

## 2013-06-24 NOTE — Telephone Encounter (Signed)
PT called and said they need an order for a Jazz Finger splint faxed to them at [phone]. The therapist thought this would help her. Faxed order to number provided

## 2013-06-24 NOTE — Assessment & Plan Note (Signed)
At this point I think we do need to refer her to someone that can do collagenase injections for Dupuytren's contracture. I discussed this with Dr. Carollee Massed nurse, and he is able to do these in the office. I can see her back on an as needed basis.

## 2013-06-24 NOTE — Progress Notes (Signed)
  Subjective:    CC: Followup  HPI: Dupuytren's contracture: Unfortunately she was having some pain at the last visit so I did perform a guided injection into the left fifth flexor tendon sheath. She does have chronic Dupuytren's contracture, and has never had collagenase injections.  Past medical history, Surgical history, Family history not pertinant except as noted below, Social history, Allergies, and medications have been entered into the medical record, reviewed, and no changes needed.   Review of Systems: No fevers, chills, night sweats, weight loss, chest pain, or shortness of breath.   Objective:    General: Well Developed, well nourished, and in no acute distress.  Neuro: Alert and oriented x3, extra-ocular muscles intact, sensation grossly intact.  HEENT: Normocephalic, atraumatic, pupils equal round reactive to light, neck supple, no masses, no lymphadenopathy, thyroid nonpalpable.  Skin: Warm and dry, no rashes. Cardiac: Regular rate and rhythm, no murmurs rubs or gallops, no lower extremity edema.  Respiratory: Clear to auscultation bilaterally. Not using accessory muscles, speaking in full sentences. Hands: Bilateral junction fracture involving the fourth and fifth digits. Unable to place left hand flat on the table, right side is much more mild.  Impression and Recommendations:

## 2013-07-05 DIAGNOSIS — M6281 Muscle weakness (generalized): Secondary | ICD-10-CM | POA: Diagnosis not present

## 2013-07-05 DIAGNOSIS — M6530 Trigger finger, unspecified finger: Secondary | ICD-10-CM | POA: Diagnosis not present

## 2013-07-05 DIAGNOSIS — M25649 Stiffness of unspecified hand, not elsewhere classified: Secondary | ICD-10-CM | POA: Diagnosis not present

## 2013-07-06 DIAGNOSIS — M720 Palmar fascial fibromatosis [Dupuytren]: Secondary | ICD-10-CM | POA: Diagnosis not present

## 2013-07-08 DIAGNOSIS — Z23 Encounter for immunization: Secondary | ICD-10-CM | POA: Diagnosis not present

## 2013-07-15 ENCOUNTER — Encounter: Payer: Self-pay | Admitting: Family Medicine

## 2013-07-28 ENCOUNTER — Telehealth: Payer: Self-pay

## 2013-07-28 NOTE — Telephone Encounter (Signed)
Error

## 2013-08-03 DIAGNOSIS — M6530 Trigger finger, unspecified finger: Secondary | ICD-10-CM | POA: Diagnosis not present

## 2013-08-03 DIAGNOSIS — M6281 Muscle weakness (generalized): Secondary | ICD-10-CM | POA: Diagnosis not present

## 2013-08-03 DIAGNOSIS — M25649 Stiffness of unspecified hand, not elsewhere classified: Secondary | ICD-10-CM | POA: Diagnosis not present

## 2013-08-15 ENCOUNTER — Ambulatory Visit: Payer: Medicare Other | Admitting: Physician Assistant

## 2013-08-15 ENCOUNTER — Encounter: Payer: Self-pay | Admitting: Family Medicine

## 2013-08-15 ENCOUNTER — Ambulatory Visit (INDEPENDENT_AMBULATORY_CARE_PROVIDER_SITE_OTHER): Payer: Medicare Other | Admitting: Family Medicine

## 2013-08-15 VITALS — BP 145/73 | HR 75 | Ht 63.5 in | Wt 118.0 lb

## 2013-08-15 DIAGNOSIS — E039 Hypothyroidism, unspecified: Secondary | ICD-10-CM | POA: Diagnosis not present

## 2013-08-15 NOTE — Progress Notes (Signed)
CC: Claire Cox is a 80 y.o. female is here for f/u thyroid   Subjective: HPI:  Followup hypothyroidism: She has been taking tri- iodine 25 mg supplement on a daily basis for the past 3 months.  She is not taking any thyroid supplementation. She still has strong reservations about starting any thyroid supplementation. Since being off of levothyroxine she has continued to notice fantastic subjective sense of well-being. She denies constipation, diarrhea, unintentional weight loss or gain. She's noticing a return of and increasing hair growth On the scalp and in the pubic region.  Denies dysphagia, swelling of the neck, depression, fatigue, anxiety or tremor   Review Of Systems Outlined In HPI  Past Medical History  Diagnosis Date  . Thyroid nodule 01/19/2012    [place] 08/2011, right lob 6. Cm and left lobe 7.0 cm, lobulated mass in left lobe measuring 3.8 cm.  [place] FNA on 02/6760 sowing follicular proliferation of undetermined sig.  Repaet FNA on 10/2011: benign.     . Osteoporosis 01/19/2012    DEXA 08/2011.  T score spine -4.3.  Reclast 08/2011. She had GI upset and irritation with Fosamax. She declines any future Reclast infusions. She also declines any future oral agents.   . Hypothyroid 01/19/2012    Diagnosed February 2013.      No family history on file.   History  Substance Use Topics  . Smoking status: Former Research scientist (life sciences)  . Smokeless tobacco: Not on file  . Alcohol Use: Not on file     Objective: Filed Vitals:   08/15/13 1309  BP: 145/73  Pulse: 75    General: Alert and Oriented, No Acute Distress HEENT: Pupils equal, round, reactive to light. Conjunctivae clear. Moist mucous membranes Neck supple without palpable lymphadenopathy nor abnormal masses. Lungs: Clear to auscultation bilaterally, no wheezing/ronchi/rales.  Comfortable work of breathing. Good air movement. Cardiac: Regular rate and rhythm. Normal S1/S2.  No murmurs, rubs, nor gallops.   Mental Status: No depression, anxiety,  nor agitation. Skin: Warm and dry.  Assessment & Plan: Claressa was seen today for f/u thyroid.  Diagnoses and associated orders for this visit:  Hypothyroid - TSH    Hypothyroidism: Clinically controlled however due for repeat TSH, joint agreement that if this has not substantially deteriorated she'll continue on iron supplementation but we will stop checking TSH every 3 months unless new symptoms arise  Return in about 3 months (around 11/13/2013).

## 2013-08-16 LAB — TSH: TSH: 12.43 u[IU]/mL — AB (ref 0.350–4.500)

## 2013-09-12 ENCOUNTER — Telehealth: Payer: Self-pay | Admitting: *Deleted

## 2013-09-12 ENCOUNTER — Ambulatory Visit (INDEPENDENT_AMBULATORY_CARE_PROVIDER_SITE_OTHER): Payer: Medicare Other | Admitting: Family Medicine

## 2013-09-12 ENCOUNTER — Encounter: Payer: Self-pay | Admitting: Family Medicine

## 2013-09-12 VITALS — BP 145/75 | HR 67 | Wt 118.0 lb

## 2013-09-12 DIAGNOSIS — K2970 Gastritis, unspecified, without bleeding: Secondary | ICD-10-CM

## 2013-09-12 DIAGNOSIS — K2990 Gastroduodenitis, unspecified, without bleeding: Secondary | ICD-10-CM

## 2013-09-12 MED ORDER — OMEPRAZOLE 40 MG PO CPDR: DELAYED_RELEASE_CAPSULE | ORAL | Status: DC

## 2013-09-12 NOTE — Progress Notes (Signed)
CC: Claire Cox is a 80 y.o. female is here for abdominal discomfort   Subjective: HPI:  1 month of epigastric pain radiating up just below the sternum nonexertional in presentation. Is on a daily basis mild to moderate severity worsening on a weekly basis. Slightly improved with aloe leaf beverages. Worse with abdominal crunches, bananas, tomatoes, large meals, lying down with a full stomach. She's had identical symptoms years ago around the time of the death of her former husband. Pain is described as a cramping/burning.  Denies unintentional weight loss, regurgitation, vomiting, nausea, change in bowel habits, blood in stool, nor radiation of pain.   Review Of Systems Outlined In HPI  Past Medical History  Diagnosis Date  . Thyroid nodule 01/19/2012    [place] 08/2011, right lob 6. Cm and left lobe 7.0 cm, lobulated mass in left lobe measuring 3.8 cm.  [place] FNA on 07/5398 sowing follicular proliferation of undetermined sig.  Repaet FNA on 10/2011: benign.     . Osteoporosis 01/19/2012    DEXA 08/2011.  T score spine -4.3.  Reclast 08/2011. She had GI upset and irritation with Fosamax. She declines any future Reclast infusions. She also declines any future oral agents.   . Hypothyroid 01/19/2012    Diagnosed February 2013.     Past Surgical History  Procedure Laterality Date  . Cataract extraction w/ intraocular lens implant  09/2011    left eye, defective implant   No family history on file.  History   Social History  . Marital Status: Married    Spouse Name: N/A    Number of Children: N/A  . Years of Education: N/A   Occupational History  . Not on file.   Social History Main Topics  . Smoking status: Former Research scientist (life sciences)  . Smokeless tobacco: Not on file  . Alcohol Use: Not on file  . Drug Use: Not on file  . Sexual Activity: Not on file   Other Topics Concern  . Not on file   Social History Narrative  . No narrative on file     Objective: BP 145/75  Pulse 67  Wt 118 lb (53.524  kg)  General: Alert and Oriented, No Acute Distress HEENT: Pupils equal, round, reactive to light. Conjunctivae clear.  Moist mucous membranes pharynx unremarkable Lungs: Clear to auscultation bilaterally, no wheezing/ronchi/rales.  Comfortable work of breathing. Good air movement. Cardiac: Regular rate and rhythm. Normal S1/S2.  No murmurs, rubs, nor gallops.   Abdomen: Normal bowel sounds, soft without rebound or guarding. Mild epigastric pain reproduced with palpation just below the xiphoid process Extremities: No peripheral edema.  Strong peripheral pulses.  Mental Status: No depression, anxiety, nor agitation. Skin: Warm and dry.  Assessment & Plan: Claire Cox was seen today for abdominal discomfort.  Diagnoses and associated orders for this visit:  Gastritis - H. pylori antibody, IgG - omeprazole (PRILOSEC) 40 MG capsule; One by mouth daily at least one hour before a meal.    Gastritis: Start omeprazole checking H. pylori antibody she reports that years ago she had a blood test for this that was negative  Return if symptoms worsen or fail to improve.

## 2013-09-12 NOTE — Telephone Encounter (Signed)
Pt left a message that she read the side effects of the medication rx'ed today( omeprazole ) and she states one of the side effects was inflammation of the stomach lining. She states she is uncomfortable starting this medication. She states she will hold off taking this medication tonight to see if there is anything else you recommend

## 2013-09-13 LAB — H. PYLORI ANTIBODY, IGG: H PYLORI IGG: 0.44 {ISR}

## 2013-09-13 NOTE — Telephone Encounter (Signed)
Pt.notified

## 2013-09-13 NOTE — Telephone Encounter (Signed)
[name], Will you please relay that in my five years of experience I've never come across anyone who actually gets this side effect, if it's of any reassurance the way this medication works actually treats and prevents inflammation of the stomach lining.  I suspect that potential side effect was listed purely for legal reasons.  If she wants to try a something a little less potent an OTC regimen would be ranitidine 150mg  twice a day for 4-6 weeks.  I take this personally and have never had any issues.

## 2013-09-26 ENCOUNTER — Telehealth: Payer: Self-pay | Admitting: *Deleted

## 2013-09-26 NOTE — Telephone Encounter (Signed)
Claire Cox, Can you ask her that just out of curiosity did she start either of the acid blocking medications we talked about last month?

## 2013-09-26 NOTE — Telephone Encounter (Signed)
Pt left message wanting you to know that she is feeling a lot better as far as her stomach pain goes & she is able to eat now.

## 2013-09-28 NOTE — Telephone Encounter (Signed)
Pt did not ever start the medication

## 2013-11-11 ENCOUNTER — Ambulatory Visit (INDEPENDENT_AMBULATORY_CARE_PROVIDER_SITE_OTHER): Payer: Medicare Other | Admitting: Family Medicine

## 2013-11-11 ENCOUNTER — Encounter: Payer: Self-pay | Admitting: Family Medicine

## 2013-11-11 VITALS — BP 133/65 | HR 68 | Wt 118.0 lb

## 2013-11-11 DIAGNOSIS — K219 Gastro-esophageal reflux disease without esophagitis: Secondary | ICD-10-CM | POA: Diagnosis not present

## 2013-11-11 MED ORDER — SUCRALFATE 1 GM/10ML PO SUSP: 1.0000 g | Freq: Four times a day (QID) | ORAL | Status: DC | PRN

## 2013-11-11 NOTE — Progress Notes (Signed)
CC: Claire Cox is a 80 y.o. female is here for discuss medication reaction   Subjective: HPI:  Complains of persistent epigastric pain ranging from mild to moderate in severity for the past 3 months. Symptoms seem to be worse during times of psychological stress. Worsened by bananas, tomatoes, alcohol. She tried omeprazole at the beginning of last week and had side effects including worsening epigastric pain, diarrhea, constipation, and tenderness throughout the entire abdomen. Symptoms are gradually been improving since stopping the medication 2 days ago. Symptoms are present all hours of the day but not interfering with sleep. Pain is epigastric and nonradiating. Slightly worse with eating. Denies unintentional weight loss, fevers, chills, back pain, melena, blood in stool, nausea, vomiting.   Review Of Systems Outlined In HPI  Past Medical History  Diagnosis Date  . Thyroid nodule 01/19/2012    [place] 08/2011, right lob 6. Cm and left lobe 7.0 cm, lobulated mass in left lobe measuring 3.8 cm.  [place] FNA on 08/4578 sowing follicular proliferation of undetermined sig.  Repaet FNA on 10/2011: benign.     . Osteoporosis 01/19/2012    DEXA 08/2011.  T score spine -4.3.  Reclast 08/2011. She had GI upset and irritation with Fosamax. She declines any future Reclast infusions. She also declines any future oral agents.   . Hypothyroid 01/19/2012    Diagnosed February 2013.     Past Surgical History  Procedure Laterality Date  . Cataract extraction w/ intraocular lens implant  09/2011    left eye, defective implant   No family history on file.  History   Social History  . Marital Status: Married    Spouse Name: N/A    Number of Children: N/A  . Years of Education: N/A   Occupational History  . Not on file.   Social History Main Topics  . Smoking status: Former Research scientist (life sciences)  . Smokeless tobacco: Not on file  . Alcohol Use: Not on file  . Drug Use: Not on file  . Sexual Activity: Not on file   Other  Topics Concern  . Not on file   Social History Narrative  . No narrative on file     Objective: BP 133/65  Pulse 68  Wt 118 lb (53.524 kg)  General: Alert and Oriented, No Acute Distress HEENT: Pupils equal, round, reactive to light. Conjunctivae clear. Moist mucous membranes pharynx unremarkable Lungs: Clear to auscultation bilaterally, no wheezing/ronchi/rales.  Comfortable work of breathing. Good air movement. Cardiac: Regular rate and rhythm. Normal S1/S2.  No murmurs, rubs, nor gallops.   Abdomen: Normal bowel sounds, soft and non tender without palpable masses. No rebound or guarding nor rigidity Extremities: No peripheral edema.  Strong peripheral pulses.  Mental Status: No depression, anxiety, nor agitation. Skin: Warm and dry.  Assessment & Plan: Meliss was seen today for discuss medication reaction.  Diagnoses and associated orders for this visit:  GERD (gastroesophageal reflux disease) - sucralfate (CARAFATE) 1 GM/10ML suspension; Take 10 mLs (1 g total) by mouth 4 (four) times daily as needed.    GERD: Uncontrolled, fortunately she has stopped all alcohol use 2 months ago. Will start sucralfate 4 times a day as needed. Discussed dietary interventions to avoid acidic foods. She's hesitant about taking ranitidine therefore sucralfate alone.Signs and symptoms requring emergent/urgent reevaluation were discussed with the patient.  25 minutes spent face-to-face during visit today of which at least 50% was counseling or coordinating care regarding: 1. GERD (gastroesophageal reflux disease)      Return  if symptoms worsen or fail to improve.

## 2013-11-18 ENCOUNTER — Telehealth: Payer: Self-pay | Admitting: *Deleted

## 2013-11-18 DIAGNOSIS — K219 Gastro-esophageal reflux disease without esophagitis: Secondary | ICD-10-CM

## 2013-11-18 MED ORDER — SUCRALFATE 1 GM/10ML PO SUSP: 1.0000 g | Freq: Four times a day (QID) | ORAL | Status: DC | PRN

## 2013-11-18 NOTE — Telephone Encounter (Signed)
Pt.notified

## 2013-11-18 NOTE — Telephone Encounter (Signed)
Pt called to let you know that the carafate has helped with her stomach pain. She has noticed that during the last meal of the day it is not as effective. Pt wants to know if it is ok to eat small snacks or even tea between longer intervals between meals. She also wants to know if she should have a refill on the medication. The pain she is having is much more reduced

## 2013-11-18 NOTE — Telephone Encounter (Signed)
Tea shouldn't cause any issues between meals but snacking could be problematic.  I'm glad she's feeling somewhat better and would expect that she'll need to use the carafate for 1-2 months until the stomach heals itself, I'll send refills to [facility].

## 2013-11-27 DIAGNOSIS — M810 Age-related osteoporosis without current pathological fracture: Secondary | ICD-10-CM | POA: Diagnosis not present

## 2013-11-27 DIAGNOSIS — S61209A Unspecified open wound of unspecified finger without damage to nail, initial encounter: Secondary | ICD-10-CM | POA: Diagnosis not present

## 2013-11-27 DIAGNOSIS — H409 Unspecified glaucoma: Secondary | ICD-10-CM | POA: Diagnosis not present

## 2013-11-27 DIAGNOSIS — Z883 Allergy status to other anti-infective agents status: Secondary | ICD-10-CM | POA: Diagnosis not present

## 2013-11-27 DIAGNOSIS — E079 Disorder of thyroid, unspecified: Secondary | ICD-10-CM | POA: Diagnosis not present

## 2013-12-07 ENCOUNTER — Encounter: Payer: Self-pay | Admitting: Family Medicine

## 2013-12-07 ENCOUNTER — Ambulatory Visit (INDEPENDENT_AMBULATORY_CARE_PROVIDER_SITE_OTHER): Payer: Medicare Other | Admitting: Family Medicine

## 2013-12-07 VITALS — BP 136/71 | HR 66 | Wt 117.0 lb

## 2013-12-07 DIAGNOSIS — Z4802 Encounter for removal of sutures: Secondary | ICD-10-CM | POA: Diagnosis not present

## 2013-12-07 DIAGNOSIS — K219 Gastro-esophageal reflux disease without esophagitis: Secondary | ICD-10-CM

## 2013-12-07 MED ORDER — SUCRALFATE 1 GM/10ML PO SUSP: 1.0000 g | Freq: Four times a day (QID) | ORAL | Status: DC | PRN

## 2013-12-07 NOTE — Progress Notes (Signed)
CC: Claire Cox is a 79 y.o. female is here for Suture / Staple Removal   Subjective: HPI:  10 days ago she cut herself with a butcher's knife while cutting a frozen piece of roast. Within one hour she was able to go to a local emergency room and had a total of 7 stitches in her left index finger and left thumb.  She has been keeping the wound clean and dry and covered. She denies any discharge, nor pain nor bleeding since sutures were placed. Denies fevers, chills, nor decreased tactile sensation in either finger.  She reports that sucralfate has made a significant improvement in her epigastric discomfort. Symptoms will slowly returned she forgets to take medication during the day. She would like to know she can have refills. Denies unintentional weight loss, no abdominal pain. Pain is also worsened by eating fast food.   Review Of Systems Outlined In HPI  Past Medical History  Diagnosis Date  . Thyroid nodule 01/19/2012    [place] 08/2011, right lob 6. Cm and left lobe 7.0 cm, lobulated mass in left lobe measuring 3.8 cm.  [place] FNA on 10/7423 sowing follicular proliferation of undetermined sig.  Repaet FNA on 10/2011: benign.     . Osteoporosis 01/19/2012    DEXA 08/2011.  T score spine -4.3.  Reclast 08/2011. She had GI upset and irritation with Fosamax. She declines any future Reclast infusions. She also declines any future oral agents.   . Hypothyroid 01/19/2012    Diagnosed February 2013.     Past Surgical History  Procedure Laterality Date  . Cataract extraction w/ intraocular lens implant  09/2011    left eye, defective implant   No family history on file.  History   Social History  . Marital Status: Married    Spouse Name: N/A    Number of Children: N/A  . Years of Education: N/A   Occupational History  . Not on file.   Social History Main Topics  . Smoking status: Former Research scientist (life sciences)  . Smokeless tobacco: Not on file  . Alcohol Use: Not on file  . Drug Use: Not on file  . Sexual  Activity: Not on file   Other Topics Concern  . Not on file   Social History Narrative  . No narrative on file     Objective: BP 136/71  Pulse 66  Wt 117 lb (53.071 kg)  General: Alert and Oriented, No Acute Distress HEENT: Pupils equal, round, reactive to light. Conjunctivae clear.  Lungs: Clear and comfortable work of breathing Cardiac: Regular rate and rhythm Extremities: No peripheral edema.  Strong peripheral pulses.  Clean, dry, intact lacerations which are well approximated without signs of infection localized on the left on near the lateral nail bed and on the back of the left index finger. Mental Status: No depression, anxiety, nor agitation. Skin: Warm and dry.  Assessment & Plan: Claire Cox was seen today for suture / staple removal.  Diagnoses and associated orders for this visit:  GERD (gastroesophageal reflux disease) - sucralfate (CARAFATE) 1 GM/10ML suspension; Take 10 mLs (1 g total) by mouth 4 (four) times daily as needed.  Visit for suture removal    All 7 sutures were removed without difficulty. Steri-Strip was placed on the index finger laceration site since this overlies the extensor surface of the interphalangeal joint, replace this as needed for the next week. GERD: Improved continue sucralfate  Return if symptoms worsen or fail to improve.

## 2014-02-27 ENCOUNTER — Encounter: Payer: Self-pay | Admitting: Family Medicine

## 2014-02-27 ENCOUNTER — Ambulatory Visit (INDEPENDENT_AMBULATORY_CARE_PROVIDER_SITE_OTHER): Payer: Medicare Other | Admitting: Family Medicine

## 2014-02-27 VITALS — BP 136/69 | HR 69 | Wt 115.0 lb

## 2014-02-27 DIAGNOSIS — E039 Hypothyroidism, unspecified: Secondary | ICD-10-CM

## 2014-02-27 NOTE — Progress Notes (Signed)
CC: Claire Cox is a 80 y.o. female is here for f/u thyroid   Subjective: HPI:  Followup hypothyroidism: She continues to take iodine supplementation from kelp and continues to abstain from taking any levothyroxine. She tells me that she still has the most energy she's ever had in her life, is growing hair back on her scalp and the genital region, and has also noticed that she no longer has any pain with sex and her libido has increased. She attributes all of these improvements to stopping levothyroxine. She denies any new skin or hair changes other than that described above, unintentional weight gain or loss, fatigue, depression, nor constipation.   Review Of Systems Outlined In HPI  Past Medical History  Diagnosis Date  . Thyroid nodule 01/19/2012    [place] 08/2011, right lob 6. Cm and left lobe 7.0 cm, lobulated mass in left lobe measuring 3.8 cm.  [place] FNA on 12/7891 sowing follicular proliferation of undetermined sig.  Repaet FNA on 10/2011: benign.     . Osteoporosis 01/19/2012    DEXA 08/2011.  T score spine -4.3.  Reclast 08/2011. She had GI upset and irritation with Fosamax. She declines any future Reclast infusions. She also declines any future oral agents.   . Hypothyroid 01/19/2012    Diagnosed February 2013.     Past Surgical History  Procedure Laterality Date  . Cataract extraction w/ intraocular lens implant  09/2011    left eye, defective implant   No family history on file.  History   Social History  . Marital Status: Married    Spouse Name: N/A    Number of Children: N/A  . Years of Education: N/A   Occupational History  . Not on file.   Social History Main Topics  . Smoking status: Former Research scientist (life sciences)  . Smokeless tobacco: Not on file  . Alcohol Use: Not on file  . Drug Use: Not on file  . Sexual Activity: Not on file   Other Topics Concern  . Not on file   Social History Narrative  . No narrative on file     Objective: BP 136/69  Pulse 69  Wt 115 lb (52.164  kg)  General: Alert and Oriented, No Acute Distress HEENT: Pupils equal, round, reactive to light. Conjunctivae clear.  Moist membranes pharynx unremarkable. No palpable thyromegaly Lungs: Clear to auscultation bilaterally, no wheezing/ronchi/rales.  Comfortable work of breathing. Good air movement. Cardiac: Regular rate and rhythm. Normal S1/S2.  No murmurs, rubs, nor gallops.   Extremities: No peripheral edema.  Strong peripheral pulses.  Mental Status: No depression, anxiety, nor agitation. Skin: Warm and dry.  Assessment & Plan: Claire Cox was seen today for f/u thyroid.  Diagnoses and associated orders for this visit:  Hypothyroidism, unspecified hypothyroidism type - TSH    Hypothyroidism: Clinically controlled, were going to check TSH one more time and as long as it's not appreciably changed from 12 in the past joint decision to stop checking this since she is extremely reluctant to have her start thyroid replacement hormones of any preparation in the future   Return in about 3 months (around 05/30/2014) for Thyroid.

## 2014-02-28 LAB — TSH: TSH: 8.188 u[IU]/mL — AB (ref 0.350–4.500)

## 2014-03-11 ENCOUNTER — Encounter: Payer: Self-pay | Admitting: Emergency Medicine

## 2014-03-11 ENCOUNTER — Emergency Department (INDEPENDENT_AMBULATORY_CARE_PROVIDER_SITE_OTHER)
Admission: EM | Admit: 2014-03-11 | Discharge: 2014-03-11 | Disposition: A | Discharge status: Home / Self Care | Payer: Medicare Other | Source: Home / Self Care | Attending: Emergency Medicine | Admitting: Emergency Medicine

## 2014-03-11 DIAGNOSIS — S0085XA Superficial foreign body of other part of head, initial encounter: Secondary | ICD-10-CM

## 2014-03-11 DIAGNOSIS — S1095XA Superficial foreign body of unspecified part of neck, initial encounter: Secondary | ICD-10-CM

## 2014-03-11 DIAGNOSIS — T7840XA Allergy, unspecified, initial encounter: Secondary | ICD-10-CM

## 2014-03-11 DIAGNOSIS — S0005XA Superficial foreign body of scalp, initial encounter: Secondary | ICD-10-CM | POA: Diagnosis not present

## 2014-03-11 MED ORDER — METHYLPREDNISOLONE ACETATE 80 MG/ML IJ SUSP
80.0000 mg | Freq: Once | INTRAMUSCULAR | Status: AC
  Administered 2014-03-11: 80 mg via INTRAMUSCULAR

## 2014-03-11 MED ORDER — PREDNISONE 20 MG PO TABS: 20.0000 mg | ORAL_TABLET | Freq: Two times a day (BID) | ORAL | Status: DC

## 2014-03-11 NOTE — ED Provider Notes (Signed)
CSN: [id number]     Arrival date & time 03/11/14  1116 History   First MD Initiated Contact with Patient 03/11/14 1125     Chief Complaint  Patient presents with  . Rash    HPI Reports onset of rash or reaction to insect bite left facial cheek 2 days ago after working in garden; area on face itching yesterday and today there is pain. --No vesicles or drainage. She feels stinger from the bite is still in the left side of cheek. Reports did have shingles vaccination about 1 year ago. Feels fatigued, minimal nausea, but denies fever or chills or vomiting or chest pain, shortness of breath, wheezing, diarrhea, or GU symptoms. No lightheadedness or syncope.  Past Medical History  Diagnosis Date  . Thyroid nodule 01/19/2012    [place] 08/2011, right lob 6. Cm and left lobe 7.0 cm, lobulated mass in left lobe measuring 3.8 cm.  [place] FNA on 11/6211 sowing follicular proliferation of undetermined sig.  Repaet FNA on 10/2011: benign.     . Osteoporosis 01/19/2012    DEXA 08/2011.  T score spine -4.3.  Reclast 08/2011. She had GI upset and irritation with Fosamax. She declines any future Reclast infusions. She also declines any future oral agents.   . Hypothyroid 01/19/2012    Diagnosed February 2013.    Past Surgical History  Procedure Laterality Date  . Cataract extraction w/ intraocular lens implant  09/2011    left eye, defective implant   History reviewed. No pertinent family history. History  Substance Use Topics  . Smoking status: Former Research scientist (life sciences)  . Smokeless tobacco: Not on file  . Alcohol Use: Not on file   OB History   Grav Para Term Preterm Abortions TAB SAB Ect Mult Living                 Review of Systems  All other systems reviewed and are negative.   Allergies  Diazepam; Erythromycin; Fosamax; and Omeprazole  Home Medications   Prior to Admission medications   Medication Sig Start Date End Date Taking? Authorizing Provider  AMBULATORY NON FORMULARY MEDICATION Tri-Iodine 25mg  daily  08/15/13   Marcial Pacas, DO  predniSONE (DELTASONE) 20 MG tablet Take 1 tablet (20 mg total) by mouth 2 (two) times daily with a meal. X 5 days 03/11/14   Jacqulyn Cane, MD  sucralfate (CARAFATE) 1 GM/10ML suspension Take 10 mLs (1 g total) by mouth 4 (four) times daily as needed. 12/07/13   Sean Hommel, DO   BP 147/69  Pulse 70  Temp(Src) 97.6 F (36.4 C) (Oral)  Resp 16  Ht 5\' 2"  (1.575 m)  Wt 115 lb (52.164 kg)  BMI 21.03 kg/m2  SpO2 99% Physical Exam  Nursing note and vitals reviewed. Constitutional: She is oriented to person, place, and time. She appears well-developed and well-nourished. No distress.  Appears fatigued, but alert, cooperative  HENT:  Head: Normocephalic and atraumatic.    There is a small black subcutaneous foreign body left facial cheek. Diffusely on face bilaterally,( left greater than right),  including left eyelid,is a blanching erythematous, edematous, blotchy rash.  No induration or fluctuance or red streaks   Eyes: Conjunctivae and EOM are normal. Pupils are equal, round, and reactive to light. Right eye exhibits no discharge. Left eye exhibits no discharge. No scleral icterus.  Neck: Normal range of motion. Neck supple. No JVD present. No tracheal deviation present.  Cardiovascular: Normal rate, regular rhythm and normal heart sounds.   No murmur  heard. Pulmonary/Chest: Effort normal and breath sounds normal. She has no wheezes.  Abdominal: Soft. She exhibits no distension. There is no tenderness.  Musculoskeletal: Normal range of motion. She exhibits no edema and no tenderness.  Lymphadenopathy:    She has no cervical adenopathy.  Neurological: She is alert and oriented to person, place, and time. No cranial nerve deficit.  Skin: Skin is warm. Rash noted.  Diffusely on face bilaterally,( left greater than right),  including left eyelid,is a blanching erythematous, edematous, blotchy rash.  No induration or fluctuance or red streaks    Psychiatric: She  has a normal mood and affect.    ED Course  Procedures (including critical care time) Labs Review Labs Reviewed - No data to display  Imaging Review No results found.   MDM   1. Foreign body of face, superficial, initial encounter   2. Allergic reaction, initial encounter    Treatment options discussed, as well as risks, benefits, alternatives. Patient voiced understanding and agreement with the following plans:  Small foreign body/stinger left facial cheek removed by me with sterile small forceps, after alcohol prep.   She tolerated well. Bacitracin and Band-Aid applied .--Aftercare discussed.  For the acute allergic reaction:  Depo-Medrol 80 mg IM stat.  Prednisone 20 mg twice a day x5 days. May use OTC Zyrtec or Benadryl when necessary itch.  Follow-up with your primary care doctor in 5-7 days if not improving, or sooner if symptoms become worse. Precautions discussed. Red flags discussed.--- Emergency room if any red flags Questions invited and answered. Patient voiced understanding and agreement.   Jacqulyn Cane, MD 03/11/14 1332

## 2014-03-11 NOTE — ED Notes (Signed)
Reports onset of rash or reaction to insect bite 2 days ago after working in garden; area on face; itching yesterday and today there is pain. Reports did have shingles vaccination about 1 year ago.

## 2014-05-24 ENCOUNTER — Ambulatory Visit (INDEPENDENT_AMBULATORY_CARE_PROVIDER_SITE_OTHER): Payer: Medicare Other | Admitting: Sports Medicine

## 2014-05-24 ENCOUNTER — Encounter: Payer: Self-pay | Admitting: Sports Medicine

## 2014-05-24 VITALS — BP 134/62 | HR 60 | Ht 62.0 in | Wt 120.0 lb

## 2014-05-24 DIAGNOSIS — T148XXA Other injury of unspecified body region, initial encounter: Secondary | ICD-10-CM | POA: Insufficient documentation

## 2014-05-24 DIAGNOSIS — M62830 Muscle spasm of back: Secondary | ICD-10-CM | POA: Diagnosis not present

## 2014-05-24 DIAGNOSIS — T148 Other injury of unspecified body region: Secondary | ICD-10-CM

## 2014-05-24 MED ORDER — SALONPAS PAIN RELIEF PATCH EX PADS: MEDICATED_PAD | CUTANEOUS | Status: DC

## 2014-05-24 NOTE — Assessment & Plan Note (Signed)
Left-sided. X-rays, topical Salonpas patches, home rehabilitation exercises. We are avoiding steroids until her wound heals.

## 2014-05-24 NOTE — Progress Notes (Signed)
  Subjective:    CC: abrasion  HPI: This is a pleasant 80 year old female, a couple of weeks ago she fell, injuring both her knees. She has several bruising on the right knee, but a fairly significant abrasion on the left knee. Overall she is improving significantly, no fevers or chills, no drainage from the wound.  Past medical history, Surgical history, Family history not pertinant except as noted below, Social history, Allergies, and medications have been entered into the medical record, reviewed, and no changes needed.   Review of Systems: No fevers, chills, night sweats, weight loss, chest pain, or shortness of breath.   Objective:    General: Well Developed, well nourished, and in no acute distress.  Neuro: Alert and oriented x3, extra-ocular muscles intact, sensation grossly intact.  HEENT: Normocephalic, atraumatic, pupils equal round reactive to light, neck supple, no masses, no lymphadenopathy, thyroid nonpalpable.  Skin: Warm and dry, no rashes. Cardiac: Regular rate and rhythm, no murmurs rubs or gallops, no lower extremity edema.  Respiratory: Clear to auscultation bilaterally. Not using accessory muscles, speaking in full sentences. Left knee: Superficial abrasion, scabbed over, no erythema or induration or drainage suggestive of bacterial superinfection. Knee has full range of motion and is stable.  Antibiotic ointment and Tegaderm applied over the abrasion.  Impression and Recommendations:

## 2014-05-24 NOTE — Assessment & Plan Note (Signed)
Topical Neosporin and Tegaderm. Change dressings daily. Do not remove scab. No signs of bacterial superinfection.

## 2014-06-05 ENCOUNTER — Ambulatory Visit (INDEPENDENT_AMBULATORY_CARE_PROVIDER_SITE_OTHER): Payer: Medicare Other | Admitting: Family Medicine

## 2014-06-05 ENCOUNTER — Encounter: Payer: Self-pay | Admitting: Family Medicine

## 2014-06-05 VITALS — BP 137/69 | HR 81 | Temp 97.9°F | Wt 118.0 lb

## 2014-06-05 DIAGNOSIS — T148 Other injury of unspecified body region: Secondary | ICD-10-CM

## 2014-06-05 DIAGNOSIS — H40219 Acute angle-closure glaucoma, unspecified eye: Secondary | ICD-10-CM | POA: Diagnosis not present

## 2014-06-05 DIAGNOSIS — T148XXA Other injury of unspecified body region, initial encounter: Secondary | ICD-10-CM

## 2014-06-05 DIAGNOSIS — J329 Chronic sinusitis, unspecified: Secondary | ICD-10-CM | POA: Diagnosis not present

## 2014-06-05 DIAGNOSIS — A499 Bacterial infection, unspecified: Secondary | ICD-10-CM | POA: Diagnosis not present

## 2014-06-05 DIAGNOSIS — B9689 Other specified bacterial agents as the cause of diseases classified elsewhere: Secondary | ICD-10-CM

## 2014-06-05 MED ORDER — DOXYCYCLINE HYCLATE 100 MG PO TABS: ORAL_TABLET | ORAL | Status: DC

## 2014-06-05 NOTE — Progress Notes (Signed)
CC: Claire Cox is a 80 y.o. female is here for Cough   Subjective: HPI:  Complains of a cough that has been present for 8-9 days on a daily basis. She describes it is productive, it is improved with Robitussin-DM and warm beverages. It is worse when lying down at night. Symptoms are moderate in severity and were beginning to improve late last week on to worsen over the weekend.   Denies fevers or chills. Denies shortness of breath or wheezing. Denies chest pain. Accompanied by fatigue  She is wondering if her abrasion on the left knee is healing. She also wants know if she can let it "AIR DRY"from now on.she denies any bleeding or discharge other than a small amount ofstraw-colored fluid that's on her bandages when she changes them daily. She continues to use Neosporin daily.  She would like for me to know about her history of acute angle glaucoma that began a decade ago. She's had surgery done to her iris to lower the pressure in her eye. She is currently not on any eyedrops. She denies any vision loss, nor headache.   Review Of Systems Outlined In HPI  Past Medical History  Diagnosis Date  . Thyroid nodule 01/19/2012    [place] 08/2011, right lob 6. Cm and left lobe 7.0 cm, lobulated mass in left lobe measuring 3.8 cm.  [place] FNA on 09/2200 sowing follicular proliferation of undetermined sig.  Repaet FNA on 10/2011: benign.     . Osteoporosis 01/19/2012    DEXA 08/2011.  T score spine -4.3.  Reclast 08/2011. She had GI upset and irritation with Fosamax. She declines any future Reclast infusions. She also declines any future oral agents.   . Hypothyroid 01/19/2012    Diagnosed February 2013.     Past Surgical History  Procedure Laterality Date  . Cataract extraction w/ intraocular lens implant  09/2011    left eye, defective implant   No family history on file.  History   Social History  . Marital Status: Married    Spouse Name: N/A    Number of Children: N/A  . Years of Education: N/A    Occupational History  . Not on file.   Social History Main Topics  . Smoking status: Former Research scientist (life sciences)  . Smokeless tobacco: Not on file  . Alcohol Use: Not on file  . Drug Use: Not on file  . Sexual Activity: Not on file   Other Topics Concern  . Not on file   Social History Narrative     Objective: BP 137/69 mmHg  Pulse 81  Temp(Src) 97.9 F (36.6 C) (Oral)  Wt 118 lb (53.524 kg)  SpO2 98%  General: Alert and Oriented, No Acute Distress HEENT: Pupils equal, round, reactive to light. Conjunctivae clear.  External ears unremarkable, canals clear with intact TMs with appropriate landmarks.  Middle ear appears open without effusion. Pink inferior turbinates.  Moist mucous membranes, pharynx without inflammation nor lesionshowever moderate cobblestoning and postnasal drip.  Neck supple without palpable lymphadenopathy nor abnormal masses. Lungs: Clear to auscultation bilaterally, no wheezing/ronchi/rales.  Comfortable work of breathing. Good air movement. Extremities: No peripheral edema.  Strong peripheral pulses.  Mental Status: No depression, anxiety, nor agitation. Skin: Warm and dry. The abrasion on her left shin is approximately 1.5 cm diameter, has granulation tissue in the center and has no signs of infection.  Assessment & Plan: Claire Cox was seen today for cough.  Diagnoses and associated orders for this visit:  Bacterial sinusitis -  doxycycline (VIBRA-TABS) 100 MG tablet; One by mouth twice a day for ten days.  Abrasion  Acute angle-closure glaucoma, unspecified laterality    Bacterial sinusitis: Start doxycycline I believe her cough is purely due to postnasal drip Abrasion of the left knee: She was provided with reassurance that this is improving it looks better than when I saw with Dr. Darene Lamer last. I encouraged her to keep it covered and moist until the granulation tissue is replaced by skin Glaucoma: added to her medical record and advised to have this managed by  an ophthalmologist  25 minutes spent face-to-face during visit today of which at least 50% was counseling or coordinating care regarding: 1. Bacterial sinusitis   2. Abrasion   3. Acute angle-closure glaucoma, unspecified laterality       Return if symptoms worsen or fail to improve.

## 2014-06-06 ENCOUNTER — Telehealth: Payer: Self-pay | Admitting: *Deleted

## 2014-06-06 MED ORDER — LEVOFLOXACIN 500 MG PO TABS: 500.0000 mg | ORAL_TABLET | Freq: Every day | ORAL | Status: DC

## 2014-06-06 NOTE — Telephone Encounter (Signed)
Pt called and states she cannot take the doxycycline. It's causing her stomach to have a burning sensation and a "sore esophagus" and nausea. She would like something else called in for her sinus infection

## 2014-06-06 NOTE — Telephone Encounter (Signed)
Levofloxacin has been sent to her [facility] and doxycycline has been added to her allergy list.

## 2014-06-07 ENCOUNTER — Ambulatory Visit: Payer: Medicare Other | Admitting: Family Medicine

## 2014-06-07 NOTE — Telephone Encounter (Signed)
Pt notified via vm

## 2014-06-09 ENCOUNTER — Telehealth: Payer: Self-pay | Admitting: *Deleted

## 2014-06-09 NOTE — Telephone Encounter (Signed)
Called patient, joint decision to continue levofloxacin as symptoms do not sound life threatening.

## 2014-06-09 NOTE — Telephone Encounter (Signed)
Pt states the Levoquin made her feel lightheaded and dizzy after one dose. She read that if she has these SE then she should call the physician immediately. Please advise

## 2014-07-05 DIAGNOSIS — Z23 Encounter for immunization: Secondary | ICD-10-CM | POA: Diagnosis not present

## 2014-08-28 ENCOUNTER — Ambulatory Visit (INDEPENDENT_AMBULATORY_CARE_PROVIDER_SITE_OTHER): Payer: Medicare Other | Admitting: Family Medicine

## 2014-08-28 ENCOUNTER — Encounter: Payer: Self-pay | Admitting: Family Medicine

## 2014-08-28 VITALS — BP 131/69 | HR 72 | Temp 98.2°F | Wt 116.0 lb

## 2014-08-28 DIAGNOSIS — J329 Chronic sinusitis, unspecified: Secondary | ICD-10-CM | POA: Diagnosis not present

## 2014-08-28 DIAGNOSIS — A499 Bacterial infection, unspecified: Secondary | ICD-10-CM | POA: Diagnosis not present

## 2014-08-28 DIAGNOSIS — E039 Hypothyroidism, unspecified: Secondary | ICD-10-CM | POA: Diagnosis not present

## 2014-08-28 DIAGNOSIS — K5909 Other constipation: Secondary | ICD-10-CM | POA: Diagnosis not present

## 2014-08-28 DIAGNOSIS — B9689 Other specified bacterial agents as the cause of diseases classified elsewhere: Secondary | ICD-10-CM

## 2014-08-28 LAB — TSH: TSH: 6.956 u[IU]/mL — ABNORMAL HIGH (ref 0.350–4.500)

## 2014-08-28 MED ORDER — LEVOFLOXACIN 500 MG PO TABS: 500.0000 mg | ORAL_TABLET | Freq: Every day | ORAL | Status: DC

## 2014-08-28 NOTE — Progress Notes (Signed)
CC: Claire Cox is a 81 y.o. female is here for f/u thyroid and Sinusitis   Subjective: HPI:   nasal congestion and facial pressure beneath the eyes with subjective postnasal drip. Nonproductive cough. Sore throat. Symptoms of an present for 7-8 days now. Worsening on a daily basis now moderate in severity.   accompanied by fatigue. Slight improvement with Robitussin,  Nothing else seems to make better or worse. It seems to be worse first thing in the morning. Denies fevers, chills,  Shortness of breath, chest pain, wheezing   Complains of constipation that has been present for the past 2 or 3 weeks. No improvement from flaxseed or aloe. Described as difficulty passing bowel movements and when it comes out at small firm pellets. Denies any abdominal pain but reports abdominal fullness. No interventions other than above. She is worried that her hypothyroidism is contributing to this.  Denies skin changes, unintentional weight loss or gain.   Review Of Systems Outlined In HPI  Past Medical History  Diagnosis Date  . Thyroid nodule 01/19/2012    [place] 08/2011, right lob 6. Cm and left lobe 7.0 cm, lobulated mass in left lobe measuring 3.8 cm.  [place] FNA on 01/4080 sowing follicular proliferation of undetermined sig.  Repaet FNA on 10/2011: benign.     . Osteoporosis 01/19/2012    DEXA 08/2011.  T score spine -4.3.  Reclast 08/2011. She had GI upset and irritation with Fosamax. She declines any future Reclast infusions. She also declines any future oral agents.   . Hypothyroid 01/19/2012    Diagnosed February 2013.     Past Surgical History  Procedure Laterality Date  . Cataract extraction w/ intraocular lens implant  09/2011    left eye, defective implant   No family history on file.  History   Social History  . Marital Status: Married    Spouse Name: N/A    Number of Children: N/A  . Years of Education: N/A   Occupational History  . Not on file.   Social History Main Topics  . Smoking status:  Former Research scientist (life sciences)  . Smokeless tobacco: Not on file  . Alcohol Use: Not on file  . Drug Use: Not on file  . Sexual Activity: Not on file   Other Topics Concern  . Not on file   Social History Narrative     Objective: BP 131/69 mmHg  Pulse 72  Temp(Src) 98.2 F (36.8 C) (Oral)  Wt 116 lb (52.617 kg)  General: Alert and Oriented, No Acute Distress HEENT: Pupils equal, round, reactive to light. Conjunctivae clear.  External ears unremarkable, canals clear with intact TMs with appropriate landmarks.  Middle ear appears open without effusion. Pink inferior turbinates.  Moist mucous membranes, pharynx without inflammation nor lesions  However moderate postnasal drip and cobblestoning.  Neck supple without palpable lymphadenopathy nor abnormal masses. Lungs: Clear to auscultation bilaterally, no wheezing/ronchi/rales.  Comfortable work of breathing. Good air movement. Cardiac: Regular rate and rhythm. Normal S1/S2.  No murmurs, rubs, nor gallops.   Extremities: No peripheral edema.  Strong peripheral pulses.  Mental Status: No depression, anxiety, nor agitation. Skin: Warm and dry.  Assessment & Plan: Cynithia was seen today for f/u thyroid and sinusitis.  Diagnoses and associated orders for this visit:  Bacterial sinusitis - levofloxacin (LEVAQUIN) 500 MG tablet; Take 1 tablet (500 mg total) by mouth daily.  Hypothyroidism, unspecified hypothyroidism type - TSH  Other constipation     bacterial sinusitis: Start levofloxacin. Consider nasal saline  washes  hypothyroidism: Suspicion that this could be  Uncontrolled with her new constipation, rechecking TSH discussed using psyllium powder to help with bowel movements.  Return if symptoms worsen or fail to improve.

## 2014-10-29 DIAGNOSIS — R61 Generalized hyperhidrosis: Secondary | ICD-10-CM | POA: Diagnosis not present

## 2014-10-29 DIAGNOSIS — M810 Age-related osteoporosis without current pathological fracture: Secondary | ICD-10-CM | POA: Diagnosis not present

## 2014-10-29 DIAGNOSIS — J449 Chronic obstructive pulmonary disease, unspecified: Secondary | ICD-10-CM | POA: Diagnosis not present

## 2014-10-29 DIAGNOSIS — R0789 Other chest pain: Secondary | ICD-10-CM | POA: Diagnosis not present

## 2014-10-29 DIAGNOSIS — R1013 Epigastric pain: Secondary | ICD-10-CM | POA: Diagnosis not present

## 2014-10-29 DIAGNOSIS — R531 Weakness: Secondary | ICD-10-CM | POA: Diagnosis not present

## 2014-10-29 DIAGNOSIS — R110 Nausea: Secondary | ICD-10-CM | POA: Diagnosis not present

## 2014-10-29 DIAGNOSIS — J00 Acute nasopharyngitis [common cold]: Secondary | ICD-10-CM | POA: Diagnosis not present

## 2014-10-29 DIAGNOSIS — R6883 Chills (without fever): Secondary | ICD-10-CM | POA: Diagnosis not present

## 2014-10-29 DIAGNOSIS — R918 Other nonspecific abnormal finding of lung field: Secondary | ICD-10-CM | POA: Diagnosis not present

## 2014-10-29 DIAGNOSIS — R011 Cardiac murmur, unspecified: Secondary | ICD-10-CM | POA: Diagnosis not present

## 2014-10-29 DIAGNOSIS — Z881 Allergy status to other antibiotic agents status: Secondary | ICD-10-CM | POA: Diagnosis not present

## 2014-10-29 DIAGNOSIS — E079 Disorder of thyroid, unspecified: Secondary | ICD-10-CM | POA: Diagnosis not present

## 2014-10-30 ENCOUNTER — Encounter: Payer: Self-pay | Admitting: Family Medicine

## 2014-10-30 ENCOUNTER — Ambulatory Visit (INDEPENDENT_AMBULATORY_CARE_PROVIDER_SITE_OTHER): Payer: Medicare Other | Admitting: Family Medicine

## 2014-10-30 VITALS — BP 135/69 | HR 69 | Wt 118.0 lb

## 2014-10-30 DIAGNOSIS — R51 Headache: Secondary | ICD-10-CM | POA: Diagnosis not present

## 2014-10-30 DIAGNOSIS — E539 Vitamin B deficiency, unspecified: Secondary | ICD-10-CM | POA: Diagnosis not present

## 2014-10-30 DIAGNOSIS — D519 Vitamin B12 deficiency anemia, unspecified: Secondary | ICD-10-CM | POA: Diagnosis not present

## 2014-10-30 DIAGNOSIS — R4182 Altered mental status, unspecified: Secondary | ICD-10-CM | POA: Diagnosis not present

## 2014-10-30 DIAGNOSIS — R519 Headache, unspecified: Secondary | ICD-10-CM

## 2014-10-30 NOTE — Progress Notes (Signed)
CC: Claire Cox is a 81 y.o. female is here for hospital f/u   Subjective: HPI:   awoke Sunday morning around 2 AM with a intense sensation of fear anxiety and that something bad was about to happen. It was accompanied by fluctuating subjective fevers and chills but temperature has never been above 99. Symptoms were bad enough to where they went to a local emergency room where she had a moderate hyperglycemia, normal urinalysis, normal cardiac enzymes, normal EKG, overall normal metabolic panel and a chest x-ray showing only hyperinflation. She tells me that she is a little less anxious but she still has waves of fearfulness and feels that her mind is playing tricks on her. For example yesterday she was convinced that they had a visitor in the house whenthey actually did not. Additionally she has this new fear that somebody is plotting to break into their house. She actually was confused yesterday while in her home thinking that they were at a hotel room. Throughout all this she has not had any visual or auditory hallucinations. She denies any coordination difficulty nor any motor or sensory disturbances other than that described above. On Saturday she believes she may have been exposed to weed killer that a neighbor was spraying however this would have been by inhalation and only for one second.   She denies diarrhea, constipation, sob, cough, wheezing, chest pain, rash, dysuria, urinary frequency, nor urgency. ROS positive for headache and epigastric pain.   Review Of Systems Outlined In HPI  Past Medical History  Diagnosis Date  . Thyroid nodule 01/19/2012    [place] 08/2011, right lob 6. Cm and left lobe 7.0 cm, lobulated mass in left lobe measuring 3.8 cm.  [place] FNA on 03/239 sowing follicular proliferation of undetermined sig.  Repaet FNA on 10/2011: benign.     . Osteoporosis 01/19/2012    DEXA 08/2011.  T score spine -4.3.  Reclast 08/2011. She had GI upset and irritation with Fosamax. She declines  any future Reclast infusions. She also declines any future oral agents.   . Hypothyroid 01/19/2012    Diagnosed February 2013.     Past Surgical History  Procedure Laterality Date  . Cataract extraction w/ intraocular lens implant  09/2011    left eye, defective implant   No family history on file.  History   Social History  . Marital Status: Married    Spouse Name: N/A  . Number of Children: N/A  . Years of Education: N/A   Occupational History  . Not on file.   Social History Main Topics  . Smoking status: Former Research scientist (life sciences)  . Smokeless tobacco: Not on file  . Alcohol Use: Not on file  . Drug Use: Not on file  . Sexual Activity: Not on file   Other Topics Concern  . Not on file   Social History Narrative     Objective: BP 135/69 mmHg  Pulse 69  Wt 118 lb (53.524 kg)  General: Alert and Oriented, No Acute Distress HEENT: Pupils equal, round, reactive to light. Conjunctivae clear.  Moist mucous membranes.  No palpable thyromegaly. Lungs: Clear to auscultation bilaterally, no wheezing/ronchi/rales.  Comfortable work of breathing. Good air movement. Cardiac: Regular rate and rhythm. Normal S1/S2.  No murmurs, rubs, nor gallops.   Neuro: CN II-XII grossly intact, full strength/rom of all four extremities, C5/L4/S1 DTRs 2/4 bilaterally, gait normal, rapid alternating movements normal, heel-shin test normal, Rhomberg normal. Extremities: No peripheral edema.  Strong peripheral pulses.  Mental Status:  No depression, anxiety, nor agitation. Skin: Warm and dry.  Assessment & Plan: Claire Cox was seen today for hospital f/u.  Diagnoses and all orders for this visit:  Altered mental status, unspecified altered mental status type Orders: -     TSH -     T4, free -     T3, free -     Urinalysis, Routine w reflex microscopic -     Urine culture -     Vitamin B12  Vitamin B deficiency Orders: -     TSH -     T4, free -     T3, free -     Urinalysis, Routine w reflex  microscopic -     Urine culture -     Vitamin B12   Altered mental status: Rule out significant thyroid abnormality. I'd like to get a urinalysis and culture to be more confident about her not having a UTI contributing to AMS. She has a remote history of B12 deficiency therefore rechecking this as well. We will proceed with a MRI of the brain.  40 minutes spent face-to-face during visit today of which at least 50% was counseling or coordinating care regarding: 1. Altered mental status, unspecified altered mental status type   2. Vitamin B deficiency      Return if symptoms worsen or fail to improve.

## 2014-10-30 NOTE — Addendum Note (Signed)
Addended by: Marcial Pacas on: 10/30/2014 06:29 PM   Modules accepted: Orders

## 2014-10-31 ENCOUNTER — Other Ambulatory Visit: Payer: Self-pay | Admitting: Family Medicine

## 2014-10-31 ENCOUNTER — Telehealth: Payer: Self-pay | Admitting: Family Medicine

## 2014-10-31 DIAGNOSIS — R110 Nausea: Secondary | ICD-10-CM

## 2014-10-31 LAB — URINALYSIS, ROUTINE W REFLEX MICROSCOPIC
Bilirubin Urine: NEGATIVE
GLUCOSE, UA: NEGATIVE mg/dL
HGB URINE DIPSTICK: NEGATIVE
Ketones, ur: 15 mg/dL — AB
LEUKOCYTES UA: NEGATIVE
Nitrite: NEGATIVE
PROTEIN: 30 mg/dL — AB
Specific Gravity, Urine: 1.027 (ref 1.005–1.030)
Urobilinogen, UA: 0.2 mg/dL (ref 0.0–1.0)
pH: 5.5 (ref 5.0–8.0)

## 2014-10-31 LAB — URINALYSIS, MICROSCOPIC ONLY
BACTERIA UA: NONE SEEN
CASTS: NONE SEEN

## 2014-10-31 LAB — T3, FREE: T3 FREE: 2.3 pg/mL (ref 2.3–4.2)

## 2014-10-31 LAB — TSH: TSH: 5.174 u[IU]/mL — ABNORMAL HIGH (ref 0.350–4.500)

## 2014-10-31 LAB — T4, FREE: FREE T4: 0.88 ng/dL (ref 0.80–1.80)

## 2014-10-31 LAB — VITAMIN B12: Vitamin B-12: 688 pg/mL (ref 211–911)

## 2014-10-31 MED ORDER — ONDANSETRON HCL 4 MG PO TABS: 4.0000 mg | ORAL_TABLET | Freq: Three times a day (TID) | ORAL | Status: DC | PRN

## 2014-10-31 NOTE — Telephone Encounter (Signed)
Updated patient with lab results, she reports some nausea and constipation, calling in zofran.  Denies fever.  Only one episode of confusion lastnight before bed but feeling oriented this morning. Proceeding with MRI.

## 2014-11-01 ENCOUNTER — Ambulatory Visit
Admission: RE | Admit: 2014-11-01 | Discharge: 2014-11-01 | Disposition: A | Discharge status: Home / Self Care | Payer: Medicare Other | Source: Ambulatory Visit | Attending: Family Medicine | Admitting: Family Medicine

## 2014-11-01 ENCOUNTER — Telehealth: Payer: Self-pay | Admitting: Family Medicine

## 2014-11-01 DIAGNOSIS — R519 Headache, unspecified: Secondary | ICD-10-CM

## 2014-11-01 DIAGNOSIS — R410 Disorientation, unspecified: Secondary | ICD-10-CM | POA: Diagnosis not present

## 2014-11-01 DIAGNOSIS — R51 Headache: Secondary | ICD-10-CM | POA: Diagnosis not present

## 2014-11-01 DIAGNOSIS — R4182 Altered mental status, unspecified: Secondary | ICD-10-CM | POA: Diagnosis not present

## 2014-11-01 DIAGNOSIS — G9389 Other specified disorders of brain: Secondary | ICD-10-CM

## 2014-11-01 LAB — URINE CULTURE
Colony Count: NO GROWTH
Organism ID, Bacteria: NO GROWTH

## 2014-11-01 MED ORDER — GADOBENATE DIMEGLUMINE 529 MG/ML IV SOLN: 9.0000 mL | Freq: Once | INTRAVENOUS | Status: AC | PRN

## 2014-11-01 MED ORDER — LEVETIRACETAM 500 MG PO TABS: 500.0000 mg | ORAL_TABLET | Freq: Two times a day (BID) | ORAL | Status: DC

## 2014-11-01 NOTE — Telephone Encounter (Signed)
I notified patient and husband about her MRI concerning for brain cancer.  Discussed that confirmation of cancer and prognosis can not be fully determined until a biopsy is obtained.  I was frank that it is extremely likely to be cancer and this will probably the defining condition that will someday lead to her death.  AMS could possibly be seizure activity therefore advised to start keppra BID and avoid all driving.  Plan to refer her to [facility] with Dr. Boris Sharper who I'm reaching out to tonight in hopes of getting her into his clinic this week or early next week.

## 2014-11-02 ENCOUNTER — Telehealth: Payer: Self-pay | Admitting: Family Medicine

## 2014-11-02 DIAGNOSIS — G9389 Other specified disorders of brain: Secondary | ICD-10-CM

## 2014-11-02 NOTE — Telephone Encounter (Signed)
Referral request

## 2014-11-06 DIAGNOSIS — R410 Disorientation, unspecified: Secondary | ICD-10-CM | POA: Diagnosis not present

## 2014-11-06 DIAGNOSIS — R569 Unspecified convulsions: Secondary | ICD-10-CM | POA: Diagnosis not present

## 2014-11-06 DIAGNOSIS — G939 Disorder of brain, unspecified: Secondary | ICD-10-CM | POA: Diagnosis not present

## 2014-11-06 DIAGNOSIS — Z888 Allergy status to other drugs, medicaments and biological substances status: Secondary | ICD-10-CM | POA: Diagnosis not present

## 2014-11-06 DIAGNOSIS — Z881 Allergy status to other antibiotic agents status: Secondary | ICD-10-CM | POA: Diagnosis not present

## 2014-11-21 ENCOUNTER — Encounter: Payer: Self-pay | Admitting: Family Medicine

## 2014-11-21 ENCOUNTER — Ambulatory Visit (INDEPENDENT_AMBULATORY_CARE_PROVIDER_SITE_OTHER): Payer: Medicare Other | Admitting: Family Medicine

## 2014-11-21 ENCOUNTER — Other Ambulatory Visit: Payer: Self-pay | Admitting: Family Medicine

## 2014-11-21 VITALS — BP 132/76 | HR 67 | Ht 62.0 in | Wt 114.0 lb

## 2014-11-21 DIAGNOSIS — L819 Disorder of pigmentation, unspecified: Secondary | ICD-10-CM | POA: Diagnosis not present

## 2014-11-21 DIAGNOSIS — L814 Other melanin hyperpigmentation: Secondary | ICD-10-CM | POA: Diagnosis not present

## 2014-11-21 DIAGNOSIS — L570 Actinic keratosis: Secondary | ICD-10-CM | POA: Diagnosis not present

## 2014-11-21 NOTE — Progress Notes (Signed)
CC: Claire Cox is a 81 y.o. female is here for biopsy right arm   Subjective: HPI:  Pigmented skin lesion on the right forearm that has been present for matter of years slowly enlarging she is worried today because she's noticed that it now has a elevated component and is becoming more asymmetric. It's painless, no interventions as of yet. No history of skin cancer. Denies pain or itching. Enlargement has occurred only slow almost yearly basis. She believes it use to be a single color now has different shades of pigmentation.   Review Of Systems Outlined In HPI  Past Medical History  Diagnosis Date  . Thyroid nodule 01/19/2012    [place] 08/2011, right lob 6. Cm and left lobe 7.0 cm, lobulated mass in left lobe measuring 3.8 cm.  [place] FNA on 10/979 sowing follicular proliferation of undetermined sig.  Repaet FNA on 10/2011: benign.     . Osteoporosis 01/19/2012    DEXA 08/2011.  T score spine -4.3.  Reclast 08/2011. She had GI upset and irritation with Fosamax. She declines any future Reclast infusions. She also declines any future oral agents.   . Hypothyroid 01/19/2012    Diagnosed February 2013.     Past Surgical History  Procedure Laterality Date  . Cataract extraction w/ intraocular lens implant  09/2011    left eye, defective implant   No family history on file.  History   Social History  . Marital Status: Married    Spouse Name: N/A  . Number of Children: N/A  . Years of Education: N/A   Occupational History  . Not on file.   Social History Main Topics  . Smoking status: Former Research scientist (life sciences)  . Smokeless tobacco: Not on file  . Alcohol Use: Not on file  . Drug Use: Not on file  . Sexual Activity: Not on file   Other Topics Concern  . Not on file   Social History Narrative     Objective: BP 132/76 mmHg  Pulse 67  Ht 5\' 2"  (1.575 m)  Wt 114 lb (51.71 kg)  BMI 20.85 kg/m2  Vital signs reviewed. General: Alert and Oriented, No Acute Distress HEENT: Pupils equal, round,  reactive to light. Conjunctivae clear.  External ears unremarkable.  Moist mucous membranes. Lungs: Clear and comfortable work of breathing, speaking in full sentences without accessory muscle use. Cardiac: Regular rate and rhythm.  Neuro: CN II-XII grossly intact, gait normal. Extremities: No peripheral edema.  Strong peripheral pulses.  Mental Status: No depression, anxiety, nor agitation. Logical though process. Skin: Warm and dry. 7 cm diameter pigmented lesion on the right forearm which is comprised of 2 separate shades of pigmentation, asymmetric, 1 mm raised central portion.  Assessment & Plan: Claire Cox was seen today for biopsy right arm.  Diagnoses and all orders for this visit:  Pigmented skin lesion Orders: -     Dermatology pathology   Pigmented skin lesion: Cannot rule out melanoma or premalignant lesion with clinical exam alone and therefore pathology has been obtained after verbal consent.  Punch Biopsy Procedure Note  Pre-operative Diagnosis: Suspicious lesion  Post-operative Diagnosis: same  Locations: right forearm  Indications: rule out melanoma  Anesthesia: Lidocaine 1% with epinephrine without added sodium bicarbonate  Procedure Details  History of allergy to iodine: no Patient informed of the risks (including bleeding and infection) and benefits of the  procedure and Verbal informed consent obtained.  The lesion and surrounding area was given a sterile prep using chlorhexidine and draped in  the usual sterile fashion. The skin was then stretched perpendicular to the skin tension lines and the lesion removed using the 14mm punch. The resulting ellipse was then closed. The wound was closed with 4-0 Nylon using simple interrupted stitch. Antibiotic ointment and a sterile dressing applied. The specimen was sent for pathologic examination. The patient tolerated the procedure well.  EBL: 1  ml  Findings: unremarkable  Condition: Stable  Complications: none.  Plan: 1. Instructed to keep the wound dry and covered for 24-48h and clean thereafter. 2. Warning signs of infection were reviewed.   3. Recommended that the patient use OTC analgesics as needed for pain.  4. Return for suture removal in 1 week.  Return if symptoms worsen or fail to improve.

## 2014-11-29 ENCOUNTER — Encounter: Payer: Self-pay | Admitting: Family Medicine

## 2014-11-29 ENCOUNTER — Other Ambulatory Visit: Payer: Self-pay | Admitting: Family Medicine

## 2014-11-29 ENCOUNTER — Ambulatory Visit (INDEPENDENT_AMBULATORY_CARE_PROVIDER_SITE_OTHER): Payer: Medicare Other | Admitting: Family Medicine

## 2014-11-29 VITALS — BP 138/68 | HR 54 | Wt 114.0 lb

## 2014-11-29 DIAGNOSIS — L818 Other specified disorders of pigmentation: Secondary | ICD-10-CM | POA: Diagnosis not present

## 2014-11-29 DIAGNOSIS — L819 Disorder of pigmentation, unspecified: Secondary | ICD-10-CM

## 2014-11-29 DIAGNOSIS — L089 Local infection of the skin and subcutaneous tissue, unspecified: Secondary | ICD-10-CM | POA: Diagnosis not present

## 2014-11-29 NOTE — Progress Notes (Signed)
CC: Claire Cox is a 81 y.o. female is here for remove margins   Subjective: HPI:   last week skin biopsy  On the right forearm revealed a atypical solar lentigo. She denies any other new skin lesions. She denies any pain or bleeding at the site of biopsy. We discussed the pathology could not 100% rule out the chance of this turning into cancer with the atypical cells seen on the initial biopsy.   Review Of Systems Outlined In HPI  Past Medical History  Diagnosis Date  . Thyroid nodule 01/19/2012    [place] 08/2011, right lob 6. Cm and left lobe 7.0 cm, lobulated mass in left lobe measuring 3.8 cm.  [place] FNA on 07/6107 sowing follicular proliferation of undetermined sig.  Repaet FNA on 10/2011: benign.     . Osteoporosis 01/19/2012    DEXA 08/2011.  T score spine -4.3.  Reclast 08/2011. She had GI upset and irritation with Fosamax. She declines any future Reclast infusions. She also declines any future oral agents.   . Hypothyroid 01/19/2012    Diagnosed February 2013.     Past Surgical History  Procedure Laterality Date  . Cataract extraction w/ intraocular lens implant  09/2011    left eye, defective implant   No family history on file.  History   Social History  . Marital Status: Married    Spouse Name: N/A  . Number of Children: N/A  . Years of Education: N/A   Occupational History  . Not on file.   Social History Main Topics  . Smoking status: Former Research scientist (life sciences)  . Smokeless tobacco: Not on file  . Alcohol Use: Not on file  . Drug Use: Not on file  . Sexual Activity: Not on file   Other Topics Concern  . Not on file   Social History Narrative     Objective: BP 138/68 mmHg  Pulse 54  Wt 114 lb (51.71 kg)  Vital signs reviewed. General: Alert and Oriented, No Acute Distress HEENT: Pupils equal, round, reactive to light. Conjunctivae clear.  External ears unremarkable.  Moist mucous membranes. Lungs: Clear and comfortable work of breathing, speaking in full sentences without  accessory muscle use. Cardiac: Regular rate and rhythm.  Neuro: CN II-XII grossly intact, gait normal. Extremities: No peripheral edema.  Strong peripheral pulses.  Mental Status: No depression, anxiety, nor agitation. Logical though process. Skin: Warm and dry. Clean dry intact well-healed punch biopsy that is well approximated, localized on the right forearm. Mild hyperkeratosis and pigmentation surrounding this biopsy site.  Assessment & Plan: Claire Cox was seen today for remove margins.  Diagnoses and all orders for this visit:  Pigmented skin lesion Orders: -     Dermatology pathology  Atypical melanocytic hyperplasia Orders: -     Dermatology pathology   Discussed watch and wait approach versus complete removal of the lesion today for immediate resolution of this ever torn into cancer or becoming disfiguring. Patient is adamant that she would like to have it removed today. Punch biopsy without complications, appears to have involved the entire remaining lesion. return in one week for suture removal.  25 minutes spent face-to-face during visit today of which at least 50% was counseling or coordinating care regarding: 1. Pigmented skin lesion   2. Atypical melanocytic hyperplasia      Punch Biopsy Procedure Note  Pre-operative Diagnosis: Suspicious lesion, atypical solar lentigo  Post-operative Diagnosis: same  Locations:right  forearm  Indications: atypical cells  Anesthesia: Lidocaine 1% with epinephrine without  added sodium bicarbonate  Procedure Details  History of allergy to iodine: no Patient informed of the risks (including bleeding and infection) and benefits of the  procedure and Verbal informed consent obtained.  The lesion and surrounding area was given a sterile prep using chlorhexidine and draped in the usual sterile fashion. The skin was then stretched perpendicular to the skin tension lines and the lesion removed using the 44mm punch. The resulting  ellipse was then closed. The wound was closed with 4-0 Prolene using single simple interrupted stitch. Antibiotic ointment and a sterile dressing applied. The specimen was sent for pathologic examination. The patient tolerated the procedure well.  EBL: 1 ml  Findings: unremarkable  Condition: Stable  Complications: none.  Plan: 1. Instructed to keep the wound dry and covered for 24-48h and clean thereafter. 2. Warning signs of infection were reviewed.   3. Recommended that the patient use OTC analgesics as needed for pain.  4. Return for suture removal in 1 week.  Return in about 1 week (around 12/06/2014) for suture removal.

## 2014-12-06 ENCOUNTER — Ambulatory Visit (INDEPENDENT_AMBULATORY_CARE_PROVIDER_SITE_OTHER): Payer: Medicare Other | Admitting: Family Medicine

## 2014-12-06 ENCOUNTER — Encounter: Payer: Self-pay | Admitting: Family Medicine

## 2014-12-06 VITALS — BP 131/71 | HR 67 | Wt 115.0 lb

## 2014-12-06 DIAGNOSIS — L818 Other specified disorders of pigmentation: Secondary | ICD-10-CM | POA: Diagnosis not present

## 2014-12-06 NOTE — Progress Notes (Signed)
CC: Claire Cox is a 81 y.o. female is here for Suture / Staple Removal   Subjective: HPI:  Returns for suture removal. Recent biopsies were shown to show that the lesion on her right forearm was due to sun damage and resultant atypical melanocytes. She wants to know what she can do in the future to reduce formation of any new lesions like this. She enjoys working outside and uses sunscreen. She denies any new skin lesion. No new fevers, chills, swollen lymph nodes or skin complaints.     Review Of Systems Outlined In HPI  Past Medical History  Diagnosis Date  . Thyroid nodule 01/19/2012    [place] 08/2011, right lob 6. Cm and left lobe 7.0 cm, lobulated mass in left lobe measuring 3.8 cm.  [place] FNA on 01/8294 sowing follicular proliferation of undetermined sig.  Repaet FNA on 10/2011: benign.     . Osteoporosis 01/19/2012    DEXA 08/2011.  T score spine -4.3.  Reclast 08/2011. She had GI upset and irritation with Fosamax. She declines any future Reclast infusions. She also declines any future oral agents.   . Hypothyroid 01/19/2012    Diagnosed February 2013.     Past Surgical History  Procedure Laterality Date  . Cataract extraction w/ intraocular lens implant  09/2011    left eye, defective implant   No family history on file.  History   Social History  . Marital Status: Married    Spouse Name: N/A  . Number of Children: N/A  . Years of Education: N/A   Occupational History  . Not on file.   Social History Main Topics  . Smoking status: Former Research scientist (life sciences)  . Smokeless tobacco: Not on file  . Alcohol Use: Not on file  . Drug Use: Not on file  . Sexual Activity: Not on file   Other Topics Concern  . Not on file   Social History Narrative     Objective: BP 131/71 mmHg  Pulse 67  Wt 115 lb (52.164 kg)  Vital signs reviewed. General: Alert and Oriented, No Acute Distress HEENT: Pupils equal, round, reactive to light. Conjunctivae clear.  External ears unremarkable.  Moist mucous  membranes. Lungs: Clear and comfortable work of breathing, speaking in full sentences without accessory muscle use. Cardiac: Regular rate and rhythm.  Neuro: CN II-XII grossly intact, gait normal. Extremities: No peripheral edema.  Strong peripheral pulses.  Mental Status: No depression, anxiety, nor agitation. Logical though process. Skin: Warm and dry. surgical incision on the right   forearm remains well approximated, clean, dry, without signs of infection.   Assessment & Plan: Claire Cox was seen today for suture / staple removal.  Diagnoses and all orders for this visit:  Atypical melanocytic hyperplasia   Single Prolene suture was removed without any difficulty. Time was taken to answer all of her questions regarding how the sun causes mutations in the DNA of our skin cells and how this can lead to cancer such as melanoma, squamous cell carcinoma and basal cell carcinoma. Discussed that the best prevention at this time of her life would be to avoid direct sun exposure and if she is out in the sun to wear a wide brimmed hat, loose fitting clothes that covered the entire body, and to wear SPF 50 sunscreen on any exposed skin. Additionally avoid being outside in the sun between the hours of 10-4:00 when UV exposure is usually the highest.  Return if symptoms worsen or fail to improve.  25 minutes  spent face-to-face during visit today of which at least 50% was counseling or coordinating care regarding: 1. Atypical melanocytic hyperplasia

## 2014-12-15 ENCOUNTER — Telehealth: Payer: Self-pay | Admitting: *Deleted

## 2014-12-15 MED ORDER — SUCRALFATE 1 GM/10ML PO SUSP: 1.0000 g | Freq: Four times a day (QID) | ORAL | Status: DC | PRN

## 2014-12-15 NOTE — Telephone Encounter (Signed)
Pt called and wants a rx for carafate sent to her pharmacy. She states her stomach has been upset. Sent rx into pharm

## 2014-12-22 ENCOUNTER — Ambulatory Visit: Payer: Medicare Other | Admitting: Family Medicine

## 2014-12-26 ENCOUNTER — Ambulatory Visit: Payer: Medicare Other | Admitting: Family Medicine

## 2014-12-27 ENCOUNTER — Telehealth: Payer: Self-pay | Admitting: *Deleted

## 2014-12-27 DIAGNOSIS — F4323 Adjustment disorder with mixed anxiety and depressed mood: Secondary | ICD-10-CM

## 2014-12-27 NOTE — Telephone Encounter (Signed)
[name], Will you please call [place] Betters (a patient of ours) who is a PhD counselor and see if he'd be willing to take care of Claire Cox, the only catch is that I'm not sure if she wants a faith based approach and I don't know if this is going to be an issue for [place].  If it's ok with him can you place a referral for "anxiety" for her brain tumor.   [place]: [phone]

## 2014-12-27 NOTE — Telephone Encounter (Signed)
Pt wants a referral to a counselor or a therapist to help her deal with her having a brain tumor. She states  She is having a hard time coping and dealing with her situation

## 2014-12-29 NOTE — Telephone Encounter (Signed)
Pt notified and she is ok with trying [place]; called and left a message on his vm to see if he would be willing to take her as a pt

## 2015-01-01 NOTE — Telephone Encounter (Signed)
Pt.notified

## 2015-01-01 NOTE — Telephone Encounter (Signed)
Spoke with [place] on Friday and he states pt can call directly at [phone]

## 2015-01-12 DIAGNOSIS — C711 Malignant neoplasm of frontal lobe: Secondary | ICD-10-CM | POA: Diagnosis not present

## 2015-01-12 DIAGNOSIS — E039 Hypothyroidism, unspecified: Secondary | ICD-10-CM | POA: Diagnosis not present

## 2015-01-12 DIAGNOSIS — D689 Coagulation defect, unspecified: Secondary | ICD-10-CM | POA: Diagnosis not present

## 2015-01-12 DIAGNOSIS — Z888 Allergy status to other drugs, medicaments and biological substances status: Secondary | ICD-10-CM | POA: Diagnosis not present

## 2015-01-12 DIAGNOSIS — G939 Disorder of brain, unspecified: Secondary | ICD-10-CM | POA: Diagnosis not present

## 2015-01-12 DIAGNOSIS — Z79899 Other long term (current) drug therapy: Secondary | ICD-10-CM | POA: Diagnosis not present

## 2015-01-16 DIAGNOSIS — E041 Nontoxic single thyroid nodule: Secondary | ICD-10-CM | POA: Diagnosis not present

## 2015-01-16 DIAGNOSIS — M810 Age-related osteoporosis without current pathological fracture: Secondary | ICD-10-CM | POA: Diagnosis present

## 2015-01-16 DIAGNOSIS — D869 Sarcoidosis, unspecified: Secondary | ICD-10-CM | POA: Diagnosis present

## 2015-01-16 DIAGNOSIS — R569 Unspecified convulsions: Secondary | ICD-10-CM | POA: Diagnosis present

## 2015-01-16 DIAGNOSIS — R739 Hyperglycemia, unspecified: Secondary | ICD-10-CM | POA: Diagnosis not present

## 2015-01-16 DIAGNOSIS — D496 Neoplasm of unspecified behavior of brain: Secondary | ICD-10-CM | POA: Diagnosis not present

## 2015-01-16 DIAGNOSIS — C712 Malignant neoplasm of temporal lobe: Secondary | ICD-10-CM | POA: Diagnosis not present

## 2015-01-16 DIAGNOSIS — T380X5A Adverse effect of glucocorticoids and synthetic analogues, initial encounter: Secondary | ICD-10-CM | POA: Diagnosis present

## 2015-01-16 DIAGNOSIS — G936 Cerebral edema: Secondary | ICD-10-CM | POA: Diagnosis present

## 2015-01-16 DIAGNOSIS — Z87891 Personal history of nicotine dependence: Secondary | ICD-10-CM | POA: Diagnosis not present

## 2015-01-16 DIAGNOSIS — K219 Gastro-esophageal reflux disease without esophagitis: Secondary | ICD-10-CM | POA: Diagnosis present

## 2015-01-16 DIAGNOSIS — E039 Hypothyroidism, unspecified: Secondary | ICD-10-CM | POA: Diagnosis present

## 2015-01-30 DIAGNOSIS — Z4802 Encounter for removal of sutures: Secondary | ICD-10-CM | POA: Diagnosis not present

## 2015-01-30 DIAGNOSIS — Z483 Aftercare following surgery for neoplasm: Secondary | ICD-10-CM | POA: Diagnosis not present

## 2015-01-30 DIAGNOSIS — C712 Malignant neoplasm of temporal lobe: Secondary | ICD-10-CM | POA: Diagnosis not present

## 2015-01-31 DIAGNOSIS — C719 Malignant neoplasm of brain, unspecified: Secondary | ICD-10-CM | POA: Diagnosis not present

## 2015-02-01 ENCOUNTER — Telehealth: Payer: Self-pay | Admitting: Family Medicine

## 2015-02-01 NOTE — Telephone Encounter (Signed)
Opened for care everywhere 

## 2015-02-02 DIAGNOSIS — K219 Gastro-esophageal reflux disease without esophagitis: Secondary | ICD-10-CM | POA: Diagnosis not present

## 2015-02-02 DIAGNOSIS — Z87891 Personal history of nicotine dependence: Secondary | ICD-10-CM | POA: Diagnosis not present

## 2015-02-02 DIAGNOSIS — G40909 Epilepsy, unspecified, not intractable, without status epilepticus: Secondary | ICD-10-CM | POA: Diagnosis not present

## 2015-02-02 DIAGNOSIS — C719 Malignant neoplasm of brain, unspecified: Secondary | ICD-10-CM | POA: Diagnosis not present

## 2015-02-05 ENCOUNTER — Encounter: Payer: Self-pay | Admitting: Family Medicine

## 2015-02-05 DIAGNOSIS — C719 Malignant neoplasm of brain, unspecified: Secondary | ICD-10-CM | POA: Insufficient documentation

## 2015-02-09 DIAGNOSIS — Z79899 Other long term (current) drug therapy: Secondary | ICD-10-CM | POA: Diagnosis not present

## 2015-02-09 DIAGNOSIS — E039 Hypothyroidism, unspecified: Secondary | ICD-10-CM | POA: Diagnosis not present

## 2015-02-09 DIAGNOSIS — C179 Malignant neoplasm of small intestine, unspecified: Secondary | ICD-10-CM | POA: Diagnosis not present

## 2015-02-09 DIAGNOSIS — Z9889 Other specified postprocedural states: Secondary | ICD-10-CM | POA: Diagnosis not present

## 2015-02-09 DIAGNOSIS — Z87891 Personal history of nicotine dependence: Secondary | ICD-10-CM | POA: Diagnosis not present

## 2015-02-09 DIAGNOSIS — C719 Malignant neoplasm of brain, unspecified: Secondary | ICD-10-CM | POA: Diagnosis not present

## 2015-02-12 ENCOUNTER — Ambulatory Visit (INDEPENDENT_AMBULATORY_CARE_PROVIDER_SITE_OTHER): Payer: Medicare Other | Admitting: Family Medicine

## 2015-02-12 ENCOUNTER — Encounter: Payer: Self-pay | Admitting: Family Medicine

## 2015-02-12 VITALS — BP 128/71 | HR 72 | Wt 113.0 lb

## 2015-02-12 DIAGNOSIS — R300 Dysuria: Secondary | ICD-10-CM | POA: Diagnosis not present

## 2015-02-12 DIAGNOSIS — Z510 Encounter for antineoplastic radiation therapy: Secondary | ICD-10-CM | POA: Diagnosis not present

## 2015-02-12 DIAGNOSIS — R739 Hyperglycemia, unspecified: Secondary | ICD-10-CM | POA: Diagnosis not present

## 2015-02-12 DIAGNOSIS — C719 Malignant neoplasm of brain, unspecified: Secondary | ICD-10-CM | POA: Diagnosis not present

## 2015-02-12 DIAGNOSIS — C711 Malignant neoplasm of frontal lobe: Secondary | ICD-10-CM | POA: Diagnosis not present

## 2015-02-12 DIAGNOSIS — C712 Malignant neoplasm of temporal lobe: Secondary | ICD-10-CM | POA: Diagnosis not present

## 2015-02-12 DIAGNOSIS — R7309 Other abnormal glucose: Secondary | ICD-10-CM | POA: Diagnosis not present

## 2015-02-12 NOTE — Progress Notes (Signed)
CC: Claire Cox is a 81 y.o. female is here for f/u brain biopsy   Subjective: HPI:  Early in July she was admitted for 3 days after having a brain biopsy that confirmed glioblastoma. She is in the process of getting radiation started and has already been provided Temozolmide and Bactrim to take starting on the first day of her radiation treatment. She has some questions regarding her most recent visit with her oncologist, basically things that she has forgotten regard to advice given to her. She denies any new motor or sensory disturbances, mental loss, confusion, or coordination difficulty.  While hospitalized she was receiving IV steroid-induced and was found to have moderately elevated blood sugar. She tells me she's been off of prednisone now for 2 weeks. She denies polyuria palpation or polydipsia. She's never had an elevated blood sugar in the past. She denies any vision loss.  Complains of a sensation of bladder fullness that only slightly improves with voiding. She's also had some mild dysuria in the bladder with voiding. Symptoms are mild in severity and seemed to come and go on a daily basis ever since she was hospitalized. No intervention as of yet. Denies fevers, chills, nausea or abdominal pain   Review Of Systems Outlined In HPI  Past Medical History  Diagnosis Date  . Thyroid nodule 01/19/2012    [place] 08/2011, right lob 6. Cm and left lobe 7.0 cm, lobulated mass in left lobe measuring 3.8 cm.  [place] FNA on 03/8118 sowing follicular proliferation of undetermined sig.  Repaet FNA on 10/2011: benign.     . Osteoporosis 01/19/2012    DEXA 08/2011.  T score spine -4.3.  Reclast 08/2011. She had GI upset and irritation with Fosamax. She declines any future Reclast infusions. She also declines any future oral agents.   . Hypothyroid 01/19/2012    Diagnosed February 2013.     Past Surgical History  Procedure Laterality Date  . Cataract extraction w/ intraocular lens implant  09/2011    left  eye, defective implant   No family history on file.  History   Social History  . Marital Status: Married    Spouse Name: N/A  . Number of Children: N/A  . Years of Education: N/A   Occupational History  . Not on file.   Social History Main Topics  . Smoking status: Former Research scientist (life sciences)  . Smokeless tobacco: Not on file  . Alcohol Use: Not on file  . Drug Use: Not on file  . Sexual Activity: Not on file   Other Topics Concern  . Not on file   Social History Narrative     Objective: BP 128/71 mmHg  Pulse 72  Wt 113 lb (51.256 kg)  General: Alert and Oriented, No Acute Distress HEENT: Pupils equal, round, reactive to light. Conjunctivae clear.  Moist mucous membranes Lungs: Clear to auscultation bilaterally, no wheezing/ronchi/rales.  Comfortable work of breathing. Good air movement. Cardiac: Regular rate and rhythm. Normal S1/S2.  No murmurs, rubs, nor gallops.   Extremities: No peripheral edema.  Strong peripheral pulses.  Mental Status: No depression, anxiety, nor agitation. Skin: Warm and dry. Clean dry and intact vertical surgical scar on the temporal region  Assessment & Plan: Claire Cox was seen today for f/u brain biopsy.  Diagnoses and all orders for this visit:  Dysuria Orders: -     Urinalysis, Routine w reflex microscopic -     Urine culture  Hyperglycemia Orders: -     BASIC METABOLIC PANEL WITH  GFR  Brain cancer   Dysuria: Obtaining urinalysis and culture to rule out UTI before starting chemotherapy Hypoglycemia: Due for repeat metabolic panel today Brain cancer: Time was taken to answer all of her questions regarding her recent specialty visits and the plan going forward with radiation, chemotherapy and weekly blood work with her oncologist.  40 minutes spent face-to-face during visit today of which at least 50% was counseling or coordinating care regarding: 1. Dysuria   2. Hyperglycemia   3. Brain cancer    0    Return in about 3 months  (around 05/15/2015).

## 2015-02-13 LAB — URINALYSIS, ROUTINE W REFLEX MICROSCOPIC
Bilirubin Urine: NEGATIVE
Glucose, UA: NEGATIVE mg/dL
HGB URINE DIPSTICK: NEGATIVE
KETONES UR: NEGATIVE mg/dL
Leukocytes, UA: NEGATIVE
Nitrite: NEGATIVE
PH: 5 (ref 5.0–8.0)
PROTEIN: NEGATIVE mg/dL
SPECIFIC GRAVITY, URINE: 1.012 (ref 1.005–1.030)
UROBILINOGEN UA: 0.2 mg/dL (ref 0.0–1.0)

## 2015-02-13 LAB — BASIC METABOLIC PANEL WITH GFR
BUN: 18 mg/dL (ref 7–25)
CHLORIDE: 103 mmol/L (ref 98–110)
CO2: 28 mmol/L (ref 20–31)
Calcium: 9.3 mg/dL (ref 8.6–10.4)
Creat: 0.7 mg/dL (ref 0.60–0.93)
GFR, Est African American: >89 mL/min (ref >=60)
GFR, Est Non African American: 83 mL/min (ref >=60)
GLUCOSE: 101 mg/dL — AB (ref 65–99)
POTASSIUM: 3.7 mmol/L (ref 3.5–5.3)
SODIUM: 139 mmol/L (ref 135–146)

## 2015-02-14 LAB — URINE CULTURE
COLONY COUNT: NO GROWTH
Organism ID, Bacteria: NO GROWTH

## 2015-02-20 ENCOUNTER — Encounter: Payer: Self-pay | Admitting: Family Medicine

## 2015-02-20 ENCOUNTER — Ambulatory Visit (INDEPENDENT_AMBULATORY_CARE_PROVIDER_SITE_OTHER): Payer: Medicare Other | Admitting: Family Medicine

## 2015-02-20 VITALS — BP 124/69 | HR 64 | Wt 111.0 lb

## 2015-02-20 DIAGNOSIS — C719 Malignant neoplasm of brain, unspecified: Secondary | ICD-10-CM

## 2015-02-20 DIAGNOSIS — F411 Generalized anxiety disorder: Secondary | ICD-10-CM | POA: Diagnosis not present

## 2015-02-20 NOTE — Progress Notes (Signed)
CC: Claire Cox is a 81 y.o. female is here for GI issues   Subjective: HPI:  Patient has scheduled this appointment today to discuss whether or not I think it safe for her to take a nausea medicine that will be prescribed to her prior to starting chemotherapy. She is uncertain of what the name is. According to records in the [place] system it appears this is going to be Zofran. She has tolerated this medication earlier this year and tells me it caused no side effects whatsoever and was significantly helpful with nausea. She also would like to know if she can take her iodine supplement along with temodar.  She would also like to know whether or not should safe to take sucralfate along with her chemotherapy just mentioned above.  She has an upset stomach today after eating large quantities of cherries last night and has been scared to take sucralfate due to possible interfering with her chemotherapy in the future.   Review Of Systems Outlined In HPI  Past Medical History  Diagnosis Date  . Thyroid nodule 01/19/2012    [place] 08/2011, right lob 6. Cm and left lobe 7.0 cm, lobulated mass in left lobe measuring 3.8 cm.  [place] FNA on 07/6107 sowing follicular proliferation of undetermined sig.  Repaet FNA on 10/2011: benign.     . Osteoporosis 01/19/2012    DEXA 08/2011.  T score spine -4.3.  Reclast 08/2011. She had GI upset and irritation with Fosamax. She declines any future Reclast infusions. She also declines any future oral agents.   . Hypothyroid 01/19/2012    Diagnosed February 2013.     Past Surgical History  Procedure Laterality Date  . Cataract extraction w/ intraocular lens implant  09/2011    left eye, defective implant   No family history on file.  History   Social History  . Marital Status: Married    Spouse Name: N/A  . Number of Children: N/A  . Years of Education: N/A   Occupational History  . Not on file.   Social History Main Topics  . Smoking status: Former Research scientist (life sciences)  .  Smokeless tobacco: Not on file  . Alcohol Use: Not on file  . Drug Use: Not on file  . Sexual Activity: Not on file   Other Topics Concern  . Not on file   Social History Narrative     Objective: BP 124/69 mmHg  Pulse 64  Wt 111 lb (50.349 kg)  Vital signs reviewed. General: Alert and Oriented, No Acute Distress HEENT: Pupils equal, round, reactive to light. Conjunctivae clear.  External ears unremarkable.  Moist mucous membranes. Lungs: Clear and comfortable work of breathing, speaking in full sentences without accessory muscle use. Cardiac: Regular rate and rhythm.  Neuro: CN II-XII grossly intact, gait normal. Extremities: No peripheral edema.  Strong peripheral pulses.  Mental Status: No depression,nor agitation. Mildly anxious Logical though process. Skin: Warm and dry.  Assessment & Plan: Claire Cox was seen today for gi issues.  Diagnoses and all orders for this visit:  Brain cancer  Anxiety state   She is understandably anxious and nervous about the upcoming weeks to months with radiation and chemotherapy while battling her brain cancer. Time was taken to answer all questions regarding medications and possible interactions. I let her know that I can find no interaction between iodine and her chemotherapy however I would advise her to also get a second opinion with her pharmacist who she is planning to meet with this evening.  I also let her know that she is tolerated Zofran in the past and that was going to be used to help with nausea, she does not need to be worried about this causing any side effects. I also let her know that I see no reason why sucralfate will interfere with her chemotherapy however she should also get a second opinion with her pharmacist later tonight.  40 minutes spent face-to-face during visit today of which at least 50% was counseling or coordinating care regarding: 1. Brain cancer   2. Anxiety state       Return if symptoms worsen or fail to  improve.

## 2015-02-23 ENCOUNTER — Ambulatory Visit (INDEPENDENT_AMBULATORY_CARE_PROVIDER_SITE_OTHER): Payer: Medicare Other | Admitting: Family Medicine

## 2015-02-23 ENCOUNTER — Encounter: Payer: Self-pay | Admitting: Family Medicine

## 2015-02-23 VITALS — BP 118/67 | HR 66 | Wt 114.0 lb

## 2015-02-23 DIAGNOSIS — Z510 Encounter for antineoplastic radiation therapy: Secondary | ICD-10-CM | POA: Diagnosis not present

## 2015-02-23 DIAGNOSIS — C712 Malignant neoplasm of temporal lobe: Secondary | ICD-10-CM | POA: Diagnosis not present

## 2015-02-23 DIAGNOSIS — R4182 Altered mental status, unspecified: Secondary | ICD-10-CM | POA: Diagnosis not present

## 2015-02-23 DIAGNOSIS — G939 Disorder of brain, unspecified: Secondary | ICD-10-CM

## 2015-02-23 DIAGNOSIS — C711 Malignant neoplasm of frontal lobe: Secondary | ICD-10-CM | POA: Diagnosis not present

## 2015-02-23 DIAGNOSIS — G9389 Other specified disorders of brain: Secondary | ICD-10-CM

## 2015-02-23 MED ORDER — LEVETIRACETAM 750 MG PO TABS: 750.0000 mg | ORAL_TABLET | Freq: Two times a day (BID) | ORAL | Status: DC

## 2015-02-23 NOTE — Progress Notes (Signed)
CC: Claire Cox is a 81 y.o. female is here for legs feel tingly   Subjective: HPI:  Husband has noticed patient to be more forgetful. This has been present over the past 2 days. It seems to have reached a breaking point this afternoon around 1:00. Patient states that she started to feel somewhat dizzy and was having trouble with word finding. She took a nap and felt a little better after 15 minutes of sleeping but her husband still noticed she was somewhat confused. On the way from their house to our office, a 5 minute drive, she forgot twice that they were going to the bank while on route. Yesterday she had trouble balancing her checkbook, something that has never been problem for her. Dizziness resided after taking a nap. Otherwise she states she is in her normal state of health. Symptoms have been mild in severity. She denies any hallucinations, paranoia, anxiety or depression. She denies any urinary complaints, abdominal pain, nausea, decreased appetite, headache, fevers, chills or irregular heartbeat   Review Of Systems Outlined In HPI  Past Medical History  Diagnosis Date  . Thyroid nodule 01/19/2012    [place] 08/2011, right lob 6. Cm and left lobe 7.0 cm, lobulated mass in left lobe measuring 3.8 cm.  [place] FNA on 07/8297 sowing follicular proliferation of undetermined sig.  Repaet FNA on 10/2011: benign.     . Osteoporosis 01/19/2012    DEXA 08/2011.  T score spine -4.3.  Reclast 08/2011. She had GI upset and irritation with Fosamax. She declines any future Reclast infusions. She also declines any future oral agents.   . Hypothyroid 01/19/2012    Diagnosed February 2013.     Past Surgical History  Procedure Laterality Date  . Cataract extraction w/ intraocular lens implant  09/2011    left eye, defective implant   No family history on file.  History   Social History  . Marital Status: Married    Spouse Name: N/A  . Number of Children: N/A  . Years of Education: N/A   Occupational History   . Not on file.   Social History Main Topics  . Smoking status: Former Research scientist (life sciences)  . Smokeless tobacco: Not on file  . Alcohol Use: Not on file  . Drug Use: Not on file  . Sexual Activity: Not on file   Other Topics Concern  . Not on file   Social History Narrative     Objective: BP 118/67 mmHg  Pulse 66  Wt 114 lb (51.71 kg)  General: Alert and Oriented, No Acute Distress HEENT: Pupils equal, round, reactive to light. Conjunctivae clear.  Moist mucous member and pharynx unremarkable Lungs: Clear to auscultation bilaterally, no wheezing/ronchi/rales.  Comfortable work of breathing. Good air movement. Cardiac: Regular rate and rhythm. Normal S1/S2.  No murmurs, rubs, nor gallops.   Cranial nerves II through XII grossly intact Extremities: No peripheral edema.  Strong peripheral pulses.  Mental Status: No depression, anxiety, nor agitation. Skin: Warm and dry.  Assessment & Plan: Claire Cox was seen today for legs feel tingly.  Diagnoses and all orders for this visit:  Brain mass Orders: -     levETIRAcetam (KEPPRA) 750 MG tablet; Take 1 tablet (750 mg total) by mouth 2 (two) times daily. -     Ammonia -     COMPLETE METABOLIC PANEL WITH GFR  Altered mental status, unspecified altered mental status type Orders: -     Ammonia -     COMPLETE METABOLIC PANEL WITH  GFR   Brain mass with altered mental status: She started established with oncology, she's about to start chemotherapy and radiation treatment. I like to increase her Keppra as her altered mental status could be from the progression of her tumor causing seizures. I will also like to rule out a metabolic abnormality with the labs above. I've informed her that if she does not improve over the weekend and labs are normal I would recommend she stop taking a new Hemp oil supplement.  Follow-up will be based on the above results   Return if symptoms worsen or fail to improve.

## 2015-02-24 LAB — COMPLETE METABOLIC PANEL WITH GFR
ALT: 17 U/L (ref 6–29)
AST: 20 U/L (ref 10–35)
Albumin: 4 g/dL (ref 3.6–5.1)
Alkaline Phosphatase: 50 U/L (ref 33–130)
BUN: 20 mg/dL (ref 7–25)
CO2: 27 mmol/L (ref 20–31)
Calcium: 9.2 mg/dL (ref 8.6–10.4)
Chloride: 101 mmol/L (ref 98–110)
Creat: 0.91 mg/dL (ref 0.60–0.93)
GFR, Est African American: 70 mL/min (ref >=60)
GFR, Est Non African American: 61 mL/min (ref >=60)
Glucose, Bld: 99 mg/dL (ref 65–99)
Potassium: 4.3 mmol/L (ref 3.5–5.3)
Sodium: 139 mmol/L (ref 135–146)
Total Bilirubin: 0.4 mg/dL (ref 0.2–1.2)
Total Protein: 6 g/dL — ABNORMAL LOW (ref 6.1–8.1)

## 2015-02-24 LAB — AMMONIA: Ammonia: 29 umol/L (ref 16–53)

## 2015-02-26 DIAGNOSIS — Z510 Encounter for antineoplastic radiation therapy: Secondary | ICD-10-CM | POA: Diagnosis not present

## 2015-02-26 DIAGNOSIS — C712 Malignant neoplasm of temporal lobe: Secondary | ICD-10-CM | POA: Diagnosis not present

## 2015-02-26 DIAGNOSIS — C711 Malignant neoplasm of frontal lobe: Secondary | ICD-10-CM | POA: Diagnosis not present

## 2015-02-28 DIAGNOSIS — C712 Malignant neoplasm of temporal lobe: Secondary | ICD-10-CM | POA: Diagnosis not present

## 2015-02-28 DIAGNOSIS — Z510 Encounter for antineoplastic radiation therapy: Secondary | ICD-10-CM | POA: Diagnosis not present

## 2015-02-28 DIAGNOSIS — C711 Malignant neoplasm of frontal lobe: Secondary | ICD-10-CM | POA: Diagnosis not present

## 2015-03-01 DIAGNOSIS — C712 Malignant neoplasm of temporal lobe: Secondary | ICD-10-CM | POA: Diagnosis not present

## 2015-03-01 DIAGNOSIS — Z510 Encounter for antineoplastic radiation therapy: Secondary | ICD-10-CM | POA: Diagnosis not present

## 2015-03-02 DIAGNOSIS — Z510 Encounter for antineoplastic radiation therapy: Secondary | ICD-10-CM | POA: Diagnosis not present

## 2015-03-02 DIAGNOSIS — C712 Malignant neoplasm of temporal lobe: Secondary | ICD-10-CM | POA: Diagnosis not present

## 2015-03-05 DIAGNOSIS — C712 Malignant neoplasm of temporal lobe: Secondary | ICD-10-CM | POA: Diagnosis not present

## 2015-03-05 DIAGNOSIS — Z87891 Personal history of nicotine dependence: Secondary | ICD-10-CM | POA: Diagnosis not present

## 2015-03-05 DIAGNOSIS — Z006 Encounter for examination for normal comparison and control in clinical research program: Secondary | ICD-10-CM | POA: Diagnosis not present

## 2015-03-05 DIAGNOSIS — C719 Malignant neoplasm of brain, unspecified: Secondary | ICD-10-CM | POA: Diagnosis not present

## 2015-03-05 DIAGNOSIS — G40909 Epilepsy, unspecified, not intractable, without status epilepticus: Secondary | ICD-10-CM | POA: Diagnosis not present

## 2015-03-05 DIAGNOSIS — Z510 Encounter for antineoplastic radiation therapy: Secondary | ICD-10-CM | POA: Diagnosis not present

## 2015-03-05 DIAGNOSIS — D496 Neoplasm of unspecified behavior of brain: Secondary | ICD-10-CM | POA: Diagnosis not present

## 2015-03-06 DIAGNOSIS — Z510 Encounter for antineoplastic radiation therapy: Secondary | ICD-10-CM | POA: Diagnosis not present

## 2015-03-06 DIAGNOSIS — C712 Malignant neoplasm of temporal lobe: Secondary | ICD-10-CM | POA: Diagnosis not present

## 2015-03-07 ENCOUNTER — Encounter: Payer: Self-pay | Admitting: Family Medicine

## 2015-03-07 ENCOUNTER — Ambulatory Visit (INDEPENDENT_AMBULATORY_CARE_PROVIDER_SITE_OTHER): Payer: Medicare Other | Admitting: Family Medicine

## 2015-03-07 VITALS — BP 121/63 | HR 74 | Wt 113.0 lb

## 2015-03-07 DIAGNOSIS — S3141XA Laceration without foreign body of vagina and vulva, initial encounter: Secondary | ICD-10-CM

## 2015-03-07 DIAGNOSIS — N760 Acute vaginitis: Secondary | ICD-10-CM | POA: Diagnosis not present

## 2015-03-07 DIAGNOSIS — T839XXA Unspecified complication of genitourinary prosthetic device, implant and graft, initial encounter: Secondary | ICD-10-CM | POA: Diagnosis not present

## 2015-03-07 DIAGNOSIS — S3140XA Unspecified open wound of vagina and vulva, initial encounter: Secondary | ICD-10-CM

## 2015-03-07 DIAGNOSIS — C719 Malignant neoplasm of brain, unspecified: Secondary | ICD-10-CM

## 2015-03-07 DIAGNOSIS — T8389XA Other specified complication of genitourinary prosthetic devices, implants and grafts, initial encounter: Principal | ICD-10-CM

## 2015-03-07 DIAGNOSIS — C712 Malignant neoplasm of temporal lobe: Secondary | ICD-10-CM | POA: Diagnosis not present

## 2015-03-07 DIAGNOSIS — C711 Malignant neoplasm of frontal lobe: Secondary | ICD-10-CM | POA: Diagnosis not present

## 2015-03-07 DIAGNOSIS — Z510 Encounter for antineoplastic radiation therapy: Secondary | ICD-10-CM | POA: Diagnosis not present

## 2015-03-07 MED ORDER — CLOTRIMAZOLE-BETAMETHASONE 1-0.05 % EX CREA: TOPICAL_CREAM | CUTANEOUS | Status: AC

## 2015-03-07 NOTE — Progress Notes (Signed)
CC: Claire Cox is a 81 y.o. female is here for Urinary Tract Infection   Subjective: HPI:  Complains of dysuria localized at the distal aspect of the ureter that has been present ever since a Foley catheter was removed many weeks ago. She'll have occasional days where there is not any pain at all for to return later. It's described as a burning sensation, sharp, nonradiating. Is also tender to the touch when wiping. She cannot see any lesions when she looks but she does not feel like she's getting a good view. She denies any change in her color or consistency of her urine. She denies any vaginal discharge. She's had yeast infections in the past but this feels different. She denies fevers, chills, swollen lymph nodes nor diarrhea. Symptoms were present prior to starting chemotherapy.  She's worried that she might be in a state of denial When it comes to viewing the treatment of her brain cancer. She tells me she is constantly trying to think of positive thoughts and that she believes this will help suffering process of chemotherapy and radiation. She tells me she feels great other than the above complaints and she is worried that maybe she is not grasping the severity of her situation. She understands that this cancer will most likely be the cause of her death sometime in the hopefully distant future.  Review Of Systems Outlined In HPI  Past Medical History  Diagnosis Date  . Thyroid nodule 01/19/2012    [place] 08/2011, right lob 6. Cm and left lobe 7.0 cm, lobulated mass in left lobe measuring 3.8 cm.  [place] FNA on 09/158 sowing follicular proliferation of undetermined sig.  Repaet FNA on 10/2011: benign.     . Osteoporosis 01/19/2012    DEXA 08/2011.  T score spine -4.3.  Reclast 08/2011. She had GI upset and irritation with Fosamax. She declines any future Reclast infusions. She also declines any future oral agents.   . Hypothyroid 01/19/2012    Diagnosed February 2013.     Past Surgical History   Procedure Laterality Date  . Cataract extraction w/ intraocular lens implant  09/2011    left eye, defective implant   No family history on file.  Social History   Social History  . Marital Status: Married    Spouse Name: N/A  . Number of Children: N/A  . Years of Education: N/A   Occupational History  . Not on file.   Social History Main Topics  . Smoking status: Former Research scientist (life sciences)  . Smokeless tobacco: Not on file  . Alcohol Use: Not on file  . Drug Use: Not on file  . Sexual Activity: Not on file   Other Topics Concern  . Not on file   Social History Narrative     Objective: BP 121/63 mmHg  Pulse 74  Wt 113 lb (51.256 kg)  Claire Cox as chaperone  Vital signs reviewed. General: Alert and Oriented, No Acute Distress HEENT: Pupils equal, round, reactive to light. Conjunctivae clear.  External ears unremarkable.  Moist mucous membranes. Lungs: Clear and comfortable work of breathing, speaking in full sentences without accessory muscle use. Cardiac: Regular rate and rhythm.  Genitourinary: Distal ureter appears normal without any tenderness, the junction of the left superior labia majora and the vagina has a 2 mm diameter skin tear. Touching this reproduces her symptoms. Vaginal mucosa is somewhat atrophied. No vaginal discharge Neuro: CN II-XII grossly intact, gait normal. Extremities: No peripheral edema.  Strong peripheral pulses.  Mental Status:  No depression, anxiety, nor agitation. Logical though process. Skin: Warm and dry. Assessment & Plan: Claire Cox was seen today for urinary tract infection.  Diagnoses and all orders for this visit:  Vaginal tear due to Foley catheter, initial encounter -     clotrimazole-betamethasone (LOTRISONE) cream; Apply to affected area twice a day for two weeks, may require four weeks if involving the feet/toes. -     WET PREP FOR TRICH, YEAST, CLUE  Vaginitis and vulvovaginitis -     WET PREP FOR [place], YEAST, CLUE -     Urine  Culture  Brain cancer    vaginal tear: Start Lotrisone on a daily basis for the next 2 weeks. Please call me as soon as possible if any new gynecological or genitourinary complaints occur. Ruling out bacterial parasitic or fungal infection with above Brain cancer: Reassurance provided that do not think that she is in denial about her situation with respect to treatment of brain cancer. She seemed to show good insight into the severity of her condition.  40 minutes spent face-to-face during visit today of which at least 50% was counseling or coordinating care regarding: 1. Vaginal tear due to Foley catheter, initial encounter   2. Vaginitis and vulvovaginitis   3. Brain cancer       Return if symptoms worsen or fail to improve.

## 2015-03-08 ENCOUNTER — Telehealth: Payer: Self-pay | Admitting: Family Medicine

## 2015-03-08 DIAGNOSIS — C712 Malignant neoplasm of temporal lobe: Secondary | ICD-10-CM | POA: Diagnosis not present

## 2015-03-08 DIAGNOSIS — Z510 Encounter for antineoplastic radiation therapy: Secondary | ICD-10-CM | POA: Diagnosis not present

## 2015-03-08 LAB — POCT URINALYSIS DIPSTICK
Bilirubin, UA: NEGATIVE
Blood, UA: NEGATIVE
Glucose, UA: NEGATIVE
Ketones, UA: NEGATIVE
Leukocytes, UA: NEGATIVE
Nitrite, UA: NEGATIVE
Protein, UA: NEGATIVE
Spec Grav, UA: <=1.005
Urobilinogen, UA: 0.2
pH, UA: 5.5

## 2015-03-08 LAB — WET PREP FOR TRICH, YEAST, CLUE
Clue Cells Wet Prep HPF POC: NONE SEEN
Trich, Wet Prep: NONE SEEN
WBC, Wet Prep HPF POC: NONE SEEN
Yeast Wet Prep HPF POC: NONE SEEN

## 2015-03-08 NOTE — Telephone Encounter (Signed)
[name], Will you please let patient know that her swab showed no signs of bacteria or yeast.  Urine culture is still pending.

## 2015-03-08 NOTE — Telephone Encounter (Signed)
Left message on pt.'s vm.

## 2015-03-08 NOTE — Addendum Note (Signed)
Addended by: Terance Hart on: 03/08/2015 09:15 AM   Modules accepted: Orders

## 2015-03-09 DIAGNOSIS — F4323 Adjustment disorder with mixed anxiety and depressed mood: Secondary | ICD-10-CM | POA: Diagnosis not present

## 2015-03-09 DIAGNOSIS — Z510 Encounter for antineoplastic radiation therapy: Secondary | ICD-10-CM | POA: Diagnosis not present

## 2015-03-09 DIAGNOSIS — C712 Malignant neoplasm of temporal lobe: Secondary | ICD-10-CM | POA: Diagnosis not present

## 2015-03-09 LAB — URINE CULTURE: Colony Count: 2000

## 2015-03-12 DIAGNOSIS — Z87891 Personal history of nicotine dependence: Secondary | ICD-10-CM | POA: Diagnosis not present

## 2015-03-12 DIAGNOSIS — C719 Malignant neoplasm of brain, unspecified: Secondary | ICD-10-CM | POA: Diagnosis not present

## 2015-03-12 DIAGNOSIS — Z79899 Other long term (current) drug therapy: Secondary | ICD-10-CM | POA: Diagnosis not present

## 2015-03-12 DIAGNOSIS — Z881 Allergy status to other antibiotic agents status: Secondary | ICD-10-CM | POA: Diagnosis not present

## 2015-03-12 DIAGNOSIS — K219 Gastro-esophageal reflux disease without esophagitis: Secondary | ICD-10-CM | POA: Diagnosis not present

## 2015-03-12 DIAGNOSIS — K5900 Constipation, unspecified: Secondary | ICD-10-CM | POA: Diagnosis not present

## 2015-03-12 DIAGNOSIS — C712 Malignant neoplasm of temporal lobe: Secondary | ICD-10-CM | POA: Diagnosis not present

## 2015-03-12 DIAGNOSIS — Z888 Allergy status to other drugs, medicaments and biological substances status: Secondary | ICD-10-CM | POA: Diagnosis not present

## 2015-03-12 DIAGNOSIS — Z510 Encounter for antineoplastic radiation therapy: Secondary | ICD-10-CM | POA: Diagnosis not present

## 2015-03-12 DIAGNOSIS — Z006 Encounter for examination for normal comparison and control in clinical research program: Secondary | ICD-10-CM | POA: Diagnosis not present

## 2015-03-13 DIAGNOSIS — Z510 Encounter for antineoplastic radiation therapy: Secondary | ICD-10-CM | POA: Diagnosis not present

## 2015-03-13 DIAGNOSIS — C712 Malignant neoplasm of temporal lobe: Secondary | ICD-10-CM | POA: Diagnosis not present

## 2015-03-14 DIAGNOSIS — C712 Malignant neoplasm of temporal lobe: Secondary | ICD-10-CM | POA: Diagnosis not present

## 2015-03-14 DIAGNOSIS — C711 Malignant neoplasm of frontal lobe: Secondary | ICD-10-CM | POA: Diagnosis not present

## 2015-03-14 DIAGNOSIS — Z510 Encounter for antineoplastic radiation therapy: Secondary | ICD-10-CM | POA: Diagnosis not present

## 2015-03-15 DIAGNOSIS — Z510 Encounter for antineoplastic radiation therapy: Secondary | ICD-10-CM | POA: Diagnosis not present

## 2015-03-15 DIAGNOSIS — C712 Malignant neoplasm of temporal lobe: Secondary | ICD-10-CM | POA: Diagnosis not present

## 2015-03-16 ENCOUNTER — Telehealth: Payer: Self-pay | Admitting: *Deleted

## 2015-03-16 DIAGNOSIS — C712 Malignant neoplasm of temporal lobe: Secondary | ICD-10-CM | POA: Diagnosis not present

## 2015-03-16 DIAGNOSIS — Z510 Encounter for antineoplastic radiation therapy: Secondary | ICD-10-CM | POA: Diagnosis not present

## 2015-03-16 NOTE — Telephone Encounter (Signed)
Pt states she has an upset stomach and wants to know you can call her to discuss her concerns with her. She doesn't know if she can take the carafate since it has aluminum in it

## 2015-03-16 NOTE — Telephone Encounter (Signed)
Patient notified, it's ok to take sucralfate suspension if at least 6 hours after Temodar.

## 2015-03-18 DIAGNOSIS — K210 Gastro-esophageal reflux disease with esophagitis: Secondary | ICD-10-CM | POA: Diagnosis not present

## 2015-03-18 DIAGNOSIS — M810 Age-related osteoporosis without current pathological fracture: Secondary | ICD-10-CM | POA: Diagnosis not present

## 2015-03-18 DIAGNOSIS — R9431 Abnormal electrocardiogram [ECG] [EKG]: Secondary | ICD-10-CM | POA: Diagnosis not present

## 2015-03-18 DIAGNOSIS — Z87891 Personal history of nicotine dependence: Secondary | ICD-10-CM | POA: Diagnosis not present

## 2015-03-18 DIAGNOSIS — R0789 Other chest pain: Secondary | ICD-10-CM | POA: Diagnosis not present

## 2015-03-18 DIAGNOSIS — E039 Hypothyroidism, unspecified: Secondary | ICD-10-CM | POA: Diagnosis not present

## 2015-03-19 DIAGNOSIS — K219 Gastro-esophageal reflux disease without esophagitis: Secondary | ICD-10-CM | POA: Diagnosis not present

## 2015-03-19 DIAGNOSIS — Z87891 Personal history of nicotine dependence: Secondary | ICD-10-CM | POA: Diagnosis not present

## 2015-03-19 DIAGNOSIS — C712 Malignant neoplasm of temporal lobe: Secondary | ICD-10-CM | POA: Diagnosis not present

## 2015-03-19 DIAGNOSIS — C719 Malignant neoplasm of brain, unspecified: Secondary | ICD-10-CM | POA: Diagnosis not present

## 2015-03-19 DIAGNOSIS — Z510 Encounter for antineoplastic radiation therapy: Secondary | ICD-10-CM | POA: Diagnosis not present

## 2015-03-19 DIAGNOSIS — Z006 Encounter for examination for normal comparison and control in clinical research program: Secondary | ICD-10-CM | POA: Diagnosis not present

## 2015-03-19 DIAGNOSIS — E039 Hypothyroidism, unspecified: Secondary | ICD-10-CM | POA: Diagnosis not present

## 2015-03-20 DIAGNOSIS — Z510 Encounter for antineoplastic radiation therapy: Secondary | ICD-10-CM | POA: Diagnosis not present

## 2015-03-20 DIAGNOSIS — F4323 Adjustment disorder with mixed anxiety and depressed mood: Secondary | ICD-10-CM | POA: Diagnosis not present

## 2015-03-20 DIAGNOSIS — C712 Malignant neoplasm of temporal lobe: Secondary | ICD-10-CM | POA: Diagnosis not present

## 2015-03-21 DIAGNOSIS — C712 Malignant neoplasm of temporal lobe: Secondary | ICD-10-CM | POA: Diagnosis not present

## 2015-03-21 DIAGNOSIS — C711 Malignant neoplasm of frontal lobe: Secondary | ICD-10-CM | POA: Diagnosis not present

## 2015-03-21 DIAGNOSIS — Z510 Encounter for antineoplastic radiation therapy: Secondary | ICD-10-CM | POA: Diagnosis not present

## 2015-03-22 DIAGNOSIS — Z510 Encounter for antineoplastic radiation therapy: Secondary | ICD-10-CM | POA: Diagnosis not present

## 2015-03-22 DIAGNOSIS — C712 Malignant neoplasm of temporal lobe: Secondary | ICD-10-CM | POA: Diagnosis not present

## 2015-03-23 ENCOUNTER — Ambulatory Visit (INDEPENDENT_AMBULATORY_CARE_PROVIDER_SITE_OTHER): Payer: Medicare Other | Admitting: Family Medicine

## 2015-03-23 ENCOUNTER — Encounter: Payer: Self-pay | Admitting: Family Medicine

## 2015-03-23 VITALS — BP 115/66 | HR 67 | Wt 108.0 lb

## 2015-03-23 DIAGNOSIS — C719 Malignant neoplasm of brain, unspecified: Secondary | ICD-10-CM | POA: Diagnosis not present

## 2015-03-23 DIAGNOSIS — C712 Malignant neoplasm of temporal lobe: Secondary | ICD-10-CM | POA: Diagnosis not present

## 2015-03-23 DIAGNOSIS — K2970 Gastritis, unspecified, without bleeding: Secondary | ICD-10-CM

## 2015-03-23 DIAGNOSIS — Z510 Encounter for antineoplastic radiation therapy: Secondary | ICD-10-CM | POA: Diagnosis not present

## 2015-03-23 NOTE — Progress Notes (Signed)
CC: Claire Cox is a 81 y.o. female is here for stomach issues   Subjective: HPI:  Follow-up gastritis: The patient intentionally scheduled this appointment by herself to go over her symptoms to determine whether or not her symptoms are controlled. She reports epigastric discomfort that occurs first thing in the morning after eating eggs or toast enema she takes sucralfate every morning. She tells me that sucralfate eliminates her pain for 100% for 24 hours. She does not take any dose at any other time during the day. She had an episode last weekend where she had 10 out of 10 epigastric discomfort that radiated into her chest and she was seen at a local emergency room where labs EKG and chest x-ray were unremarkable. Other than that she's been without any pain. She denies nausea or decreased appetite provided she takes Zofran before taking her chemotherapy. She denies any radiation of the pain other than the episode on Sunday. She denies any vomiting  Complains of a high viscosity stool pattern consistency over the past 2-3 days with occasional spotting from mild fecal incontinence. Symptoms have been absent today. She was dealing with constipation for the past 3 weeks and taking a multitude of stool softeners. Over the past 3 days she's also had absolutely no constipation whatsoever. She denies any blood in stool or any melanotic appearance.  Review Of Systems Outlined In HPI  Past Medical History  Diagnosis Date  . Thyroid nodule 01/19/2012    [place] 08/2011, right lob 6. Cm and left lobe 7.0 cm, lobulated mass in left lobe measuring 3.8 cm.  [place] FNA on 10/8544 sowing follicular proliferation of undetermined sig.  Repaet FNA on 10/2011: benign.     . Osteoporosis 01/19/2012    DEXA 08/2011.  T score spine -4.3.  Reclast 08/2011. She had GI upset and irritation with Fosamax. She declines any future Reclast infusions. She also declines any future oral agents.   . Hypothyroid 01/19/2012    Diagnosed February  2013.     Past Surgical History  Procedure Laterality Date  . Cataract extraction w/ intraocular lens implant  09/2011    left eye, defective implant   No family history on file.  Social History   Social History  . Marital Status: Married    Spouse Name: N/A  . Number of Children: N/A  . Years of Education: N/A   Occupational History  . Not on file.   Social History Main Topics  . Smoking status: Former Research scientist (life sciences)  . Smokeless tobacco: Not on file  . Alcohol Use: Not on file  . Drug Use: Not on file  . Sexual Activity: Not on file   Other Topics Concern  . Not on file   Social History Narrative     Objective: BP 115/66 mmHg  Pulse 67  Wt 108 lb (48.988 kg)  General: Alert and Oriented, No Acute Distress HEENT: Pupils equal, round, reactive to light. Conjunctivae clear.  Moist mucous membranes Lungs: Clear to auscultation bilaterally, no wheezing/ronchi/rales.  Comfortable work of breathing. Good air movement. Cardiac: Regular rate and rhythm. Normal S1/S2.  No murmurs, rubs, nor gallops.   Abdomen: Normal bowel sounds, soft and non tender without palpable masses. Extremities: No peripheral edema.  Strong peripheral pulses.  Mental Status: No depression, anxiety, nor agitation. Skin: Warm and dry.  Assessment & Plan: Claire Cox was seen today for stomach issues.  Diagnoses and all orders for this visit:  Brain cancer  Gastritis    brain cancer: She  is halfway through her chemotherapy and radiation plan, overall she's doing pretty good without much side effects. Gastritis: Controlled with sucralfate every morning. Discussed that this should improve after her chemotherapy is over with. Reassurance was provided that no further medication as needed if her symptoms are 100% controlled with sucralfate. Signs and symptoms requring emergent/urgent reevaluation were discussed with the patient.  40 minutes spent face-to-face during visit today of which at least 50% was  counseling or coordinating care regarding: 1. Brain cancer   2. Gastritis        Return if symptoms worsen or fail to improve.

## 2015-03-27 ENCOUNTER — Telehealth: Payer: Self-pay | Admitting: *Deleted

## 2015-03-27 ENCOUNTER — Telehealth: Payer: Self-pay | Admitting: Family Medicine

## 2015-03-27 DIAGNOSIS — Z510 Encounter for antineoplastic radiation therapy: Secondary | ICD-10-CM | POA: Diagnosis not present

## 2015-03-27 DIAGNOSIS — C712 Malignant neoplasm of temporal lobe: Secondary | ICD-10-CM | POA: Diagnosis not present

## 2015-03-27 DIAGNOSIS — F4323 Adjustment disorder with mixed anxiety and depressed mood: Secondary | ICD-10-CM | POA: Diagnosis not present

## 2015-03-27 MED ORDER — AMBULATORY NON FORMULARY MEDICATION: Status: DC

## 2015-03-27 NOTE — Telephone Encounter (Signed)
[name], Will you please let patient know that I would first recommend trying a cocktail consisting of donnatal, mallox, and lidocaine and if this does not help after one day I'm ok with sending in a Rx for Flovent and experimenting with that.  Rx in your inbox since i cant send it electronically. I've made sure that this cocktail does not interfere with any of her current medications. She can take it one hour or greater before her chemotherapy if needed.

## 2015-03-27 NOTE — Telephone Encounter (Signed)
[name], Will you please call patient and see if she can elaborate on the medication question she sent me over the weekend.  Something to do with a medication she heard [facility] uses for stomach pain.

## 2015-03-27 NOTE — Telephone Encounter (Signed)
Pt states she cannot use carafate anymore because it burns her stomach. The new medication her grandson got for her at [facility] was  [employer]. She states her grandson had precancerous cells in his esophagus and since using this his precancerous have disappeared. Pt would like to know if it is a good idea for her to use.

## 2015-03-27 NOTE — Telephone Encounter (Signed)
See phone note

## 2015-03-27 NOTE — Telephone Encounter (Signed)
Pt does not want to try the GI cocktail because it as the lidocaine in it and this caused her a lot of pain when she used this in the past

## 2015-03-28 DIAGNOSIS — C712 Malignant neoplasm of temporal lobe: Secondary | ICD-10-CM | POA: Diagnosis not present

## 2015-03-28 DIAGNOSIS — Z510 Encounter for antineoplastic radiation therapy: Secondary | ICD-10-CM | POA: Diagnosis not present

## 2015-03-28 MED ORDER — FLUTICASONE PROPIONATE HFA 220 MCG/ACT IN AERO: INHALATION_SPRAY | RESPIRATORY_TRACT | Status: DC

## 2015-03-28 NOTE — Telephone Encounter (Signed)
[name], Rx for flovent in your inbox, instructions were too long to print out.  I would still recommend at least trying the GI cocktail.

## 2015-03-28 NOTE — Telephone Encounter (Signed)
rx faxed this am

## 2015-03-29 DIAGNOSIS — C712 Malignant neoplasm of temporal lobe: Secondary | ICD-10-CM | POA: Diagnosis not present

## 2015-03-29 DIAGNOSIS — C711 Malignant neoplasm of frontal lobe: Secondary | ICD-10-CM | POA: Diagnosis not present

## 2015-03-29 DIAGNOSIS — Z510 Encounter for antineoplastic radiation therapy: Secondary | ICD-10-CM | POA: Diagnosis not present

## 2015-03-29 DIAGNOSIS — C719 Malignant neoplasm of brain, unspecified: Secondary | ICD-10-CM | POA: Diagnosis not present

## 2015-03-30 DIAGNOSIS — C712 Malignant neoplasm of temporal lobe: Secondary | ICD-10-CM | POA: Diagnosis not present

## 2015-03-30 DIAGNOSIS — Z510 Encounter for antineoplastic radiation therapy: Secondary | ICD-10-CM | POA: Diagnosis not present

## 2015-04-02 DIAGNOSIS — D696 Thrombocytopenia, unspecified: Secondary | ICD-10-CM | POA: Diagnosis not present

## 2015-04-02 DIAGNOSIS — Z510 Encounter for antineoplastic radiation therapy: Secondary | ICD-10-CM | POA: Diagnosis not present

## 2015-04-02 DIAGNOSIS — Z006 Encounter for examination for normal comparison and control in clinical research program: Secondary | ICD-10-CM | POA: Diagnosis not present

## 2015-04-02 DIAGNOSIS — Z87891 Personal history of nicotine dependence: Secondary | ICD-10-CM | POA: Diagnosis not present

## 2015-04-02 DIAGNOSIS — C712 Malignant neoplasm of temporal lobe: Secondary | ICD-10-CM | POA: Diagnosis not present

## 2015-04-02 DIAGNOSIS — E039 Hypothyroidism, unspecified: Secondary | ICD-10-CM | POA: Diagnosis not present

## 2015-04-03 ENCOUNTER — Ambulatory Visit (INDEPENDENT_AMBULATORY_CARE_PROVIDER_SITE_OTHER): Payer: Medicare Other

## 2015-04-03 ENCOUNTER — Ambulatory Visit (INDEPENDENT_AMBULATORY_CARE_PROVIDER_SITE_OTHER): Payer: Medicare Other | Admitting: Family Medicine

## 2015-04-03 ENCOUNTER — Encounter: Payer: Self-pay | Admitting: Family Medicine

## 2015-04-03 ENCOUNTER — Telehealth: Payer: Self-pay | Admitting: Family Medicine

## 2015-04-03 VITALS — BP 113/83 | HR 67 | Wt 107.0 lb

## 2015-04-03 DIAGNOSIS — C719 Malignant neoplasm of brain, unspecified: Secondary | ICD-10-CM

## 2015-04-03 DIAGNOSIS — K5900 Constipation, unspecified: Secondary | ICD-10-CM

## 2015-04-03 DIAGNOSIS — D696 Thrombocytopenia, unspecified: Secondary | ICD-10-CM | POA: Diagnosis not present

## 2015-04-03 DIAGNOSIS — Z510 Encounter for antineoplastic radiation therapy: Secondary | ICD-10-CM | POA: Diagnosis not present

## 2015-04-03 DIAGNOSIS — R109 Unspecified abdominal pain: Secondary | ICD-10-CM

## 2015-04-03 DIAGNOSIS — C712 Malignant neoplasm of temporal lobe: Secondary | ICD-10-CM | POA: Diagnosis not present

## 2015-04-03 MED ORDER — DICYCLOMINE HCL 10 MG PO CAPS: ORAL_CAPSULE | ORAL | Status: AC

## 2015-04-03 NOTE — Telephone Encounter (Signed)
Claire Cox, Will you please let patient know that her Xrays were reassuring and do not show any sign of an obstruction or fecal impaction.  I'd recommend she start on an as needed medication called dicyclomine which prevents bowel spasms which I suspect are causing her incontinence and pain symptoms. I'd like to know if it's helping after two days/nights.

## 2015-04-03 NOTE — Telephone Encounter (Signed)
Pt.notified

## 2015-04-03 NOTE — Progress Notes (Signed)
CC: Claire Cox is a 81 y.o. female is here for Constipation   Subjective: HPI:  Complains of fecal incontinence that's been going on for 1 or 2 weeks now. It's been getting worse but actually improved over the weekend when she was stopped on her chemotherapy. She describes it as peanut butter smeared on her underwear. Nothing seems to make it better or worse. Today was the first day that she had a solid bowel movement. She is not sure What makes symptoms better or worse. She is currently not using any stool softeners or MiraLAX. Over the weekend she had severe abdominal pain localized in the left and right lower quadrant. It was described as cramping and worse with pressing on these regions. She tells me that she could feel peristalsis with her hands when pressed over these regions. This discomfort was improved with passing gas. Symptoms can occur anytime of the day and occasionally will wake her up at night. Overall she tells me that her abdominal pain has improved since stopping chemotherapy on Friday. She denies any recent or remote bladder incontinence, saddle paresthesia or numbness in the legs.  She wants to go for blood work with me, she had a platelet count in the 20,000 range that appeared yesterday and she was getting prepared for platelet transfusion however today's blood work showed stability and her hematologist oncologist team decided to hold off on the transfusion. She denies any bleeding, bruising or motor or sensory disturbances.    Review Of Systems Outlined In HPI  Past Medical History  Diagnosis Date  . Thyroid nodule 01/19/2012    [place] 08/2011, right lob 6. Cm and left lobe 7.0 cm, lobulated mass in left lobe measuring 3.8 cm.  [place] FNA on 10/9673 sowing follicular proliferation of undetermined sig.  Repaet FNA on 10/2011: benign.     . Osteoporosis 01/19/2012    DEXA 08/2011.  T score spine -4.3.  Reclast 08/2011. She had GI upset and irritation with Fosamax. She declines any future  Reclast infusions. She also declines any future oral agents.   . Hypothyroid 01/19/2012    Diagnosed February 2013.     Past Surgical History  Procedure Laterality Date  . Cataract extraction w/ intraocular lens implant  09/2011    left eye, defective implant   No family history on file.  Social History   Social History  . Marital Status: Married    Spouse Name: N/A  . Number of Children: N/A  . Years of Education: N/A   Occupational History  . Not on file.   Social History Main Topics  . Smoking status: Former Research scientist (life sciences)  . Smokeless tobacco: Not on file  . Alcohol Use: Not on file  . Drug Use: Not on file  . Sexual Activity: Not on file   Other Topics Concern  . Not on file   Social History Narrative     Objective: BP 113/83 mmHg  Pulse 67  Wt 107 lb (48.535 kg)  General: Alert and Oriented, No Acute Distress HEENT: Pupils equal, round, reactive to light. Conjunctivae clear.  Moistness membranes Lungs: Clear to auscultation bilaterally, no wheezing/ronchi/rales.  Comfortable work of breathing. Good air movement. Cardiac: Regular rate and rhythm. Normal S1/S2.  No murmurs, rubs, nor gallops.   Abdomen: Normal bowel sounds, soft and non tender without palpable masses.. No guarding rebound or rigidity. Extremities: No peripheral edema.  Strong peripheral pulses.  Mental Status: No depression, anxiety, nor agitation. Skin: Warm and dry.  Assessment &  Plan: Claire Cox was seen today for constipation.  Diagnoses and all orders for this visit:  Brain cancer  Constipation, unspecified constipation type -     DG Abd 2 Views   Went over side effects of her chemotherapy medication and that this is a likely cause of her decreased white blood cell count and platelet count. As long as she is not having bleeding nor new motor or sensory disturbances she can wait until follow-up is performed later this week with either radiology or oncology to make the decision on whether or not  it safe for her to return on her chemotherapy, I compared her that it would not surprise me if her chemotherapy is held until her platelet count raises backup near 100,000. For her constipation like her to have a x-ray today to look for impaction incorporates this is causing her fecal incontinence.  Return if symptoms worsen or fail to improve. 40 minutes spent face-to-face during visit today of which at least 50% was counseling or coordinating care regarding: 1. Brain cancer   2. Constipation, unspecified constipation type

## 2015-04-04 DIAGNOSIS — C712 Malignant neoplasm of temporal lobe: Secondary | ICD-10-CM | POA: Diagnosis not present

## 2015-04-04 DIAGNOSIS — Z510 Encounter for antineoplastic radiation therapy: Secondary | ICD-10-CM | POA: Diagnosis not present

## 2015-04-05 DIAGNOSIS — C711 Malignant neoplasm of frontal lobe: Secondary | ICD-10-CM | POA: Diagnosis not present

## 2015-04-05 DIAGNOSIS — Z510 Encounter for antineoplastic radiation therapy: Secondary | ICD-10-CM | POA: Diagnosis not present

## 2015-04-05 DIAGNOSIS — C712 Malignant neoplasm of temporal lobe: Secondary | ICD-10-CM | POA: Diagnosis not present

## 2015-04-05 DIAGNOSIS — F4323 Adjustment disorder with mixed anxiety and depressed mood: Secondary | ICD-10-CM | POA: Diagnosis not present

## 2015-04-06 DIAGNOSIS — C712 Malignant neoplasm of temporal lobe: Secondary | ICD-10-CM | POA: Diagnosis not present

## 2015-04-06 DIAGNOSIS — Z510 Encounter for antineoplastic radiation therapy: Secondary | ICD-10-CM | POA: Diagnosis not present

## 2015-04-07 ENCOUNTER — Emergency Department: Admission: EM | Admit: 2015-04-07 | Discharge: 2015-04-07 | Payer: Medicare Other | Source: Home / Self Care

## 2015-04-07 DIAGNOSIS — E079 Disorder of thyroid, unspecified: Secondary | ICD-10-CM | POA: Diagnosis not present

## 2015-04-07 DIAGNOSIS — Z881 Allergy status to other antibiotic agents status: Secondary | ICD-10-CM | POA: Diagnosis not present

## 2015-04-07 DIAGNOSIS — R1013 Epigastric pain: Secondary | ICD-10-CM | POA: Diagnosis not present

## 2015-04-07 DIAGNOSIS — M810 Age-related osteoporosis without current pathological fracture: Secondary | ICD-10-CM | POA: Diagnosis not present

## 2015-04-07 DIAGNOSIS — R072 Precordial pain: Secondary | ICD-10-CM | POA: Diagnosis not present

## 2015-04-07 DIAGNOSIS — H409 Unspecified glaucoma: Secondary | ICD-10-CM | POA: Diagnosis not present

## 2015-04-07 DIAGNOSIS — C719 Malignant neoplasm of brain, unspecified: Secondary | ICD-10-CM | POA: Diagnosis not present

## 2015-04-07 DIAGNOSIS — Z888 Allergy status to other drugs, medicaments and biological substances status: Secondary | ICD-10-CM | POA: Diagnosis not present

## 2015-04-09 DIAGNOSIS — C712 Malignant neoplasm of temporal lobe: Secondary | ICD-10-CM | POA: Diagnosis not present

## 2015-04-09 DIAGNOSIS — Z510 Encounter for antineoplastic radiation therapy: Secondary | ICD-10-CM | POA: Diagnosis not present

## 2015-04-10 DIAGNOSIS — C712 Malignant neoplasm of temporal lobe: Secondary | ICD-10-CM | POA: Diagnosis not present

## 2015-04-10 DIAGNOSIS — Z510 Encounter for antineoplastic radiation therapy: Secondary | ICD-10-CM | POA: Diagnosis not present

## 2015-04-11 ENCOUNTER — Telehealth: Payer: Self-pay | Admitting: Family Medicine

## 2015-04-11 ENCOUNTER — Telehealth: Payer: Self-pay

## 2015-04-11 DIAGNOSIS — K859 Acute pancreatitis, unspecified: Secondary | ICD-10-CM

## 2015-04-11 DIAGNOSIS — Z510 Encounter for antineoplastic radiation therapy: Secondary | ICD-10-CM | POA: Diagnosis not present

## 2015-04-11 DIAGNOSIS — K5900 Constipation, unspecified: Secondary | ICD-10-CM

## 2015-04-11 DIAGNOSIS — F4323 Adjustment disorder with mixed anxiety and depressed mood: Secondary | ICD-10-CM | POA: Diagnosis not present

## 2015-04-11 DIAGNOSIS — C712 Malignant neoplasm of temporal lobe: Secondary | ICD-10-CM | POA: Diagnosis not present

## 2015-04-11 NOTE — Telephone Encounter (Signed)
Dr Vallarie Mare, Radiation Oncology, called and left a message. He wants to know if Dr Ileene Rubens has started a referral for GI because patient has issues with her pancreas. If not he will do the referral. He left his contact number and I will call him back if this is the first you have heard of the referral.    Office phone: [phone] Pager:            [phone]

## 2015-04-11 NOTE — Telephone Encounter (Signed)
Pt's appt with GI has been moved up to to Sept 27 th next Tuesday. Pt notified per Dr. Ileene Rubens U/S order will be placed today since appt got moved up.

## 2015-04-11 NOTE — Telephone Encounter (Signed)
[name], Will you please let patient know that earlier today I got word that her referral was finally complete and she [facility] [place].  I wanted to hold off on getting an ultrasound since this appointment is coming up so soon and I don't want to have her do a procedure that might be un-necessary or if her gastroenterologist needs something more sophisticated like a CT or MRI.  Basically I don't want to put her through the stress of any potentially unnecessary procedures until she see's GI.

## 2015-04-11 NOTE — Addendum Note (Signed)
Addended by: Marcial Pacas on: 04/11/2015 05:19 PM   Modules accepted: Orders

## 2015-04-11 NOTE — Telephone Encounter (Signed)
The referral has been started, I will ask our clerks to expedite the process.

## 2015-04-12 DIAGNOSIS — Z961 Presence of intraocular lens: Secondary | ICD-10-CM | POA: Diagnosis not present

## 2015-04-12 DIAGNOSIS — C711 Malignant neoplasm of frontal lobe: Secondary | ICD-10-CM | POA: Diagnosis not present

## 2015-04-12 DIAGNOSIS — Z9841 Cataract extraction status, right eye: Secondary | ICD-10-CM | POA: Diagnosis not present

## 2015-04-12 DIAGNOSIS — H43811 Vitreous degeneration, right eye: Secondary | ICD-10-CM | POA: Diagnosis not present

## 2015-04-12 DIAGNOSIS — Z9842 Cataract extraction status, left eye: Secondary | ICD-10-CM | POA: Diagnosis not present

## 2015-04-12 DIAGNOSIS — Z510 Encounter for antineoplastic radiation therapy: Secondary | ICD-10-CM | POA: Diagnosis not present

## 2015-04-12 DIAGNOSIS — C712 Malignant neoplasm of temporal lobe: Secondary | ICD-10-CM | POA: Diagnosis not present

## 2015-04-13 ENCOUNTER — Telehealth: Payer: Self-pay | Admitting: Family Medicine

## 2015-04-13 ENCOUNTER — Ambulatory Visit (HOSPITAL_BASED_OUTPATIENT_CLINIC_OR_DEPARTMENT_OTHER)
Admission: RE | Admit: 2015-04-13 | Discharge: 2015-04-13 | Disposition: A | Discharge status: Home / Self Care | Payer: Medicare Other | Source: Ambulatory Visit | Attending: Family Medicine | Admitting: Family Medicine

## 2015-04-13 DIAGNOSIS — R197 Diarrhea, unspecified: Secondary | ICD-10-CM | POA: Diagnosis not present

## 2015-04-13 DIAGNOSIS — R102 Pelvic and perineal pain: Secondary | ICD-10-CM | POA: Diagnosis not present

## 2015-04-13 DIAGNOSIS — K760 Fatty (change of) liver, not elsewhere classified: Secondary | ICD-10-CM | POA: Diagnosis not present

## 2015-04-13 DIAGNOSIS — R1013 Epigastric pain: Secondary | ICD-10-CM | POA: Diagnosis present

## 2015-04-13 DIAGNOSIS — C712 Malignant neoplasm of temporal lobe: Secondary | ICD-10-CM | POA: Diagnosis not present

## 2015-04-13 NOTE — Telephone Encounter (Signed)
Claire Cox is going to call pt to have her go to [facility]

## 2015-04-13 NOTE — Telephone Encounter (Signed)
[name], Can you please call radiology and ask them if they've been able to contact Ms. Odowd to schedule the [place] order placed earlier this week?

## 2015-04-16 DIAGNOSIS — C712 Malignant neoplasm of temporal lobe: Secondary | ICD-10-CM | POA: Diagnosis not present

## 2015-04-17 ENCOUNTER — Other Ambulatory Visit: Payer: Self-pay | Admitting: Family Medicine

## 2015-04-17 DIAGNOSIS — R748 Abnormal levels of other serum enzymes: Secondary | ICD-10-CM | POA: Diagnosis not present

## 2015-04-17 DIAGNOSIS — K5900 Constipation, unspecified: Secondary | ICD-10-CM | POA: Diagnosis not present

## 2015-04-17 DIAGNOSIS — K2950 Unspecified chronic gastritis without bleeding: Secondary | ICD-10-CM

## 2015-04-17 DIAGNOSIS — K2900 Acute gastritis without bleeding: Secondary | ICD-10-CM | POA: Diagnosis not present

## 2015-04-18 DIAGNOSIS — C712 Malignant neoplasm of temporal lobe: Secondary | ICD-10-CM | POA: Diagnosis not present

## 2015-04-19 ENCOUNTER — Encounter: Payer: Self-pay | Admitting: Family Medicine

## 2015-04-19 ENCOUNTER — Ambulatory Visit (INDEPENDENT_AMBULATORY_CARE_PROVIDER_SITE_OTHER): Payer: Medicare Other | Admitting: Family Medicine

## 2015-04-19 VITALS — BP 109/67 | HR 61 | Wt 105.0 lb

## 2015-04-19 DIAGNOSIS — C719 Malignant neoplasm of brain, unspecified: Secondary | ICD-10-CM | POA: Diagnosis not present

## 2015-04-19 DIAGNOSIS — K859 Acute pancreatitis without necrosis or infection, unspecified: Secondary | ICD-10-CM | POA: Insufficient documentation

## 2015-04-19 DIAGNOSIS — F4323 Adjustment disorder with mixed anxiety and depressed mood: Secondary | ICD-10-CM | POA: Diagnosis not present

## 2015-04-19 DIAGNOSIS — K2950 Unspecified chronic gastritis without bleeding: Secondary | ICD-10-CM

## 2015-04-19 NOTE — Progress Notes (Signed)
CC: Claire Cox is a 81 y.o. female is here for Follow-up   Subjective: HPI:  Follow-up brain cancer: She's been told earlier this week that once her platelet count gets to about 100 she is eligible to restarting chemotherapy. She was told that she'll need to use the same chemotherapy that she had been using. If and when she restarts that she'll need to complete 6 months of treatment. She tells me that radiation is now complete and no longer needed. She is not sure whether or not she should restart chemotherapy if she becomes a candidate. Her chemotherapy has been causing epigastric pain, constipation and has complicated what and when she can eat. She tells me her life has been significantly impacted and negative way since starting the therapy.  She recently saw a gastroenterologist was diagnosed with chronic gastritis and started on Protonix. Fortunate she's not having a paradoxical reaction as of yet like she had with omeprazole when I prescribed it last year. She occasionally wakes up in the middle the night with epigastric pain and sometimes it radiates to the back. It has not been accompanied by any nausea or decreased appetite. She denies any fevers, chills, cough, vomiting, regurgitation or difficulty swallowing.   Review Of Systems Outlined In HPI  Past Medical History  Diagnosis Date  . Thyroid nodule 01/19/2012    [place] 08/2011, right lob 6. Cm and left lobe 7.0 cm, lobulated mass in left lobe measuring 3.8 cm.  [place] FNA on 0/6237 sowing follicular proliferation of undetermined sig.  Repaet FNA on 10/2011: benign.     . Osteoporosis 01/19/2012    DEXA 08/2011.  T score spine -4.3.  Reclast 08/2011. She had GI upset and irritation with Fosamax. She declines any future Reclast infusions. She also declines any future oral agents.   . Hypothyroid 01/19/2012    Diagnosed February 2013.     Past Surgical History  Procedure Laterality Date  . Cataract extraction w/ intraocular lens implant  09/2011    left eye, defective implant   No family history on file.  Social History   Social History  . Marital Status: Married    Spouse Name: N/A  . Number of Children: N/A  . Years of Education: N/A   Occupational History  . Not on file.   Social History Main Topics  . Smoking status: Former Research scientist (life sciences)  . Smokeless tobacco: Not on file  . Alcohol Use: Not on file  . Drug Use: Not on file  . Sexual Activity: Not on file   Other Topics Concern  . Not on file   Social History Narrative     Objective: BP 109/67 mmHg  Pulse 61  Wt 105 lb (47.628 kg)  General: Alert and Oriented, No Acute Distress HEENT: Pupils equal, round, reactive to light. Conjunctivae clear.  Moist mucous membranes pharynx unremarkable Lungs: Clear to auscultation bilaterally, no wheezing/ronchi/rales.  Comfortable work of breathing. Good air movement. Cardiac: Regular rate and rhythm. Normal S1/S2.  No murmurs, rubs, nor gallops.   Abdomen: Normal bowel sounds, soft and non tender without palpable masses. Extremities: No peripheral edema.  Strong peripheral pulses.  Mental Status: No depression, anxiety, nor agitation. Skin: Warm and dry.  Assessment & Plan: Claire Cox was seen today for follow-up.  Diagnoses and all orders for this visit:  Brain cancer  Chronic gastritis  Acute pancreatitis, unspecified pancreatitis type    chronic gastritis: Agree with  Starting Protonix on a daily basis. Asked her to follow-up with me  in one week and if this is not helping we can increase it to twice a day.  Acute pancreatitis:  Unless she develops nausea or persistent pain that radiates into the back holding off in any  Further testing since  At this time aggressive measures would not be pursued  To fix any obstruction causing his pancreatitis.  Brain cancer: at a long discussion with the patient and her husband regarding goals of care and quality of life. All parties agree that ever  Since she started showing side  effects of chemotherapy her quality of life has been incredibly poor.  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  All parties are aware that her brain cancer is terminal and the chances of full remission are essentially 0. Joint decision of all parties not to pursue chemotherapy  And to focus on improving quality of life.  40 minutes spent face-to-face during visit today of which at least 50% was counseling or coordinating care regarding: 1. Brain cancer   2. Chronic gastritis   3. Acute pancreatitis, unspecified pancreatitis type      Return in about 1 week (around 04/26/2015) for 30 Minute Appointment.

## 2015-04-20 DIAGNOSIS — C712 Malignant neoplasm of temporal lobe: Secondary | ICD-10-CM | POA: Diagnosis not present

## 2015-04-23 DIAGNOSIS — C712 Malignant neoplasm of temporal lobe: Secondary | ICD-10-CM | POA: Diagnosis not present

## 2015-04-23 DIAGNOSIS — D72819 Decreased white blood cell count, unspecified: Secondary | ICD-10-CM | POA: Diagnosis not present

## 2015-04-23 DIAGNOSIS — F4323 Adjustment disorder with mixed anxiety and depressed mood: Secondary | ICD-10-CM | POA: Diagnosis not present

## 2015-04-23 DIAGNOSIS — D696 Thrombocytopenia, unspecified: Secondary | ICD-10-CM | POA: Diagnosis not present

## 2015-04-23 DIAGNOSIS — C719 Malignant neoplasm of brain, unspecified: Secondary | ICD-10-CM | POA: Diagnosis not present

## 2015-04-23 DIAGNOSIS — Z87891 Personal history of nicotine dependence: Secondary | ICD-10-CM | POA: Diagnosis not present

## 2015-04-23 DIAGNOSIS — E039 Hypothyroidism, unspecified: Secondary | ICD-10-CM | POA: Diagnosis not present

## 2015-04-23 DIAGNOSIS — D709 Neutropenia, unspecified: Secondary | ICD-10-CM | POA: Diagnosis not present

## 2015-04-25 ENCOUNTER — Ambulatory Visit (INDEPENDENT_AMBULATORY_CARE_PROVIDER_SITE_OTHER): Payer: Medicare Other | Admitting: Family Medicine

## 2015-04-25 ENCOUNTER — Encounter: Payer: Self-pay | Admitting: Family Medicine

## 2015-04-25 VITALS — BP 109/61 | HR 63 | Wt 105.0 lb

## 2015-04-25 DIAGNOSIS — K2950 Unspecified chronic gastritis without bleeding: Secondary | ICD-10-CM | POA: Diagnosis not present

## 2015-04-25 DIAGNOSIS — D702 Other drug-induced agranulocytosis: Secondary | ICD-10-CM | POA: Diagnosis not present

## 2015-04-25 DIAGNOSIS — R300 Dysuria: Secondary | ICD-10-CM | POA: Diagnosis not present

## 2015-04-25 DIAGNOSIS — C719 Malignant neoplasm of brain, unspecified: Secondary | ICD-10-CM

## 2015-04-25 DIAGNOSIS — C712 Malignant neoplasm of temporal lobe: Secondary | ICD-10-CM | POA: Diagnosis not present

## 2015-04-25 LAB — POCT URINALYSIS DIPSTICK
Bilirubin, UA: NEGATIVE
GLUCOSE UA: NEGATIVE
Ketones, UA: NEGATIVE
Leukocytes, UA: NEGATIVE
Nitrite, UA: NEGATIVE
Protein, UA: NEGATIVE
SPEC GRAV UA: 1.01
UROBILINOGEN UA: 0.2
pH, UA: 6

## 2015-04-25 NOTE — Progress Notes (Signed)
CC: Claire Cox is a 81 y.o. female is here for 1 week f/u   Subjective: HPI:  Follow up brain cancer.  Husband has noticed she's much more forgetful now.  She's having trouble keeping track of time and the date. Symptoms are mild in severity affecting her all hours of the day.  Nothing makes better or worse.  ROS positive for dysuria.  Also having trouble with word finding to a mild degree.  She's confused as to why she has been told not to eat raw fruits and veggies.  Denies fevers, chills, cough, SOB, constipation or diarrhea.  Follow-up gastritis: Since starting on Protonix she believes it is helping with her epigastric discomfort. Symptoms are still present if she eats and lies down within a few hours. She had some abdominal pain earlier this morning but localizes it low in the abdomen and it resolved 15 minutes after taking dicyclomine. It was nonradiating. She's had 2 painless bowel movements this morning. She denies nausea or decreased appetite.  White blood cell count has been below 1.0 for a few weeks now. She continues to see hematology 3 times a week at [facility] to check her platelets and determine if she needs a platelet transfusion. She's had some bruising on her face after a colleague accidentally elbowed her gardening. She tells me the bruising is significantly improving with time. She's had some bruising on the elbow and thigh that she shows me today and denies pain.   Review Of Systems Outlined In HPI  Past Medical History  Diagnosis Date  . Thyroid nodule 01/19/2012    [place] 08/2011, right lob 6. Cm and left lobe 7.0 cm, lobulated mass in left lobe measuring 3.8 cm.  [place] FNA on 08/7251 sowing follicular proliferation of undetermined sig.  Repaet FNA on 10/2011: benign.     . Osteoporosis 01/19/2012    DEXA 08/2011.  T score spine -4.3.  Reclast 08/2011. She had GI upset and irritation with Fosamax. She declines any future Reclast infusions. She also declines any future oral agents.    . Hypothyroid 01/19/2012    Diagnosed February 2013.     Past Surgical History  Procedure Laterality Date  . Cataract extraction w/ intraocular lens implant  09/2011    left eye, defective implant   No family history on file.  Social History   Social History  . Marital Status: Married    Spouse Name: N/A  . Number of Children: N/A  . Years of Education: N/A   Occupational History  . Not on file.   Social History Main Topics  . Smoking status: Former Research scientist (life sciences)  . Smokeless tobacco: Not on file  . Alcohol Use: Not on file  . Drug Use: Not on file  . Sexual Activity: Not on file   Other Topics Concern  . Not on file   Social History Narrative     Objective: BP 109/61 mmHg  Pulse 63  Wt 105 lb (47.628 kg)  General: alert, disoriented to date however oriented to month and year and day of week., No Acute Distress HEENT: Pupils equal, round, reactive to light. Conjunctivae clear.  External ears unremarkable, canals clear with intact TMs with appropriate landmarks.  Middle ear appears open without effusion. Moist mucousmembranes pharynx unremarkable Lungs: Clear to auscultation bilaterally, no wheezing/ronchi/rales.  Comfortable work of breathing. Good air movement.   Abdomen: soft flat nontender Extremities: No peripheral edema.  Strong peripheral pulses.  Mental Status: No depression, anxiety, nor agitation. Skin: Warm  and dry.  Assessment & Plan: Rosanne was seen today for 1 week f/u.  Diagnoses and all orders for this visit:  Dysuria -     Urinalysis Dipstick  Chronic gastritis  Malignant neoplasm of brain, unspecified location ([place])  Drug-induced neutropenia (HCC)   Dysuria: Checking urinalysis and culture to rule out UTI especially in the setting of new forgetfulness Chronic gastritis: Improved and controlled with Protonix continue take this daily, I encouraged her to also take dicyclomine with every meal and before bedtime to help reduce bowel spasm which  she was expressing her today. Drug-induced neutropenia: Time was taken to answer all of her questions regarding why she has limitations on what she can eat and where she can go to public due to her neutropenia. I made sure that she is understands that she is extremely susceptible to infections and she should avoid crowds, uncooked food and all fruits and veggies.  40 minutes spent face-to-face during visit today of which at least 50% was counseling or coordinating care regarding: 1. Dysuria   2. Chronic gastritis   3. Malignant neoplasm of brain, unspecified location ([place])   4. Drug-induced neutropenia (HCC)       No Follow-up on file.

## 2015-04-25 NOTE — Addendum Note (Signed)
Addended by: Isaias Cowman C on: 04/25/2015 11:23 AM   Modules accepted: Orders

## 2015-04-27 DIAGNOSIS — C712 Malignant neoplasm of temporal lobe: Secondary | ICD-10-CM | POA: Diagnosis not present

## 2015-04-27 DIAGNOSIS — Z006 Encounter for examination for normal comparison and control in clinical research program: Secondary | ICD-10-CM | POA: Diagnosis not present

## 2015-04-27 LAB — URINE CULTURE
Colony Count: NO GROWTH
ORGANISM ID, BACTERIA: NO GROWTH

## 2015-04-30 DIAGNOSIS — D696 Thrombocytopenia, unspecified: Secondary | ICD-10-CM | POA: Diagnosis not present

## 2015-04-30 DIAGNOSIS — K219 Gastro-esophageal reflux disease without esophagitis: Secondary | ICD-10-CM | POA: Diagnosis not present

## 2015-04-30 DIAGNOSIS — E039 Hypothyroidism, unspecified: Secondary | ICD-10-CM | POA: Diagnosis not present

## 2015-04-30 DIAGNOSIS — C712 Malignant neoplasm of temporal lobe: Secondary | ICD-10-CM | POA: Diagnosis not present

## 2015-04-30 DIAGNOSIS — D72819 Decreased white blood cell count, unspecified: Secondary | ICD-10-CM | POA: Diagnosis not present

## 2015-04-30 DIAGNOSIS — Z87891 Personal history of nicotine dependence: Secondary | ICD-10-CM | POA: Diagnosis not present

## 2015-04-30 DIAGNOSIS — D709 Neutropenia, unspecified: Secondary | ICD-10-CM | POA: Diagnosis not present

## 2015-05-01 ENCOUNTER — Telehealth: Payer: Self-pay | Admitting: *Deleted

## 2015-05-01 DIAGNOSIS — K5900 Constipation, unspecified: Secondary | ICD-10-CM | POA: Diagnosis not present

## 2015-05-01 DIAGNOSIS — K2940 Chronic atrophic gastritis without bleeding: Secondary | ICD-10-CM | POA: Diagnosis not present

## 2015-05-01 NOTE — Telephone Encounter (Signed)
Pt was notified today that her urine culture was negative. Results were reviewed

## 2015-05-02 DIAGNOSIS — C712 Malignant neoplasm of temporal lobe: Secondary | ICD-10-CM | POA: Diagnosis not present

## 2015-05-03 DIAGNOSIS — F4323 Adjustment disorder with mixed anxiety and depressed mood: Secondary | ICD-10-CM | POA: Diagnosis not present

## 2015-05-04 DIAGNOSIS — D696 Thrombocytopenia, unspecified: Secondary | ICD-10-CM | POA: Diagnosis not present

## 2015-05-04 DIAGNOSIS — E039 Hypothyroidism, unspecified: Secondary | ICD-10-CM | POA: Diagnosis not present

## 2015-05-04 DIAGNOSIS — D72819 Decreased white blood cell count, unspecified: Secondary | ICD-10-CM | POA: Diagnosis not present

## 2015-05-04 DIAGNOSIS — C719 Malignant neoplasm of brain, unspecified: Secondary | ICD-10-CM | POA: Diagnosis not present

## 2015-05-04 DIAGNOSIS — Z87891 Personal history of nicotine dependence: Secondary | ICD-10-CM | POA: Diagnosis not present

## 2015-05-04 DIAGNOSIS — K219 Gastro-esophageal reflux disease without esophagitis: Secondary | ICD-10-CM | POA: Diagnosis not present

## 2015-05-04 DIAGNOSIS — D709 Neutropenia, unspecified: Secondary | ICD-10-CM | POA: Diagnosis not present

## 2015-05-07 DIAGNOSIS — C712 Malignant neoplasm of temporal lobe: Secondary | ICD-10-CM | POA: Diagnosis not present

## 2015-05-08 ENCOUNTER — Ambulatory Visit (INDEPENDENT_AMBULATORY_CARE_PROVIDER_SITE_OTHER): Payer: Medicare Other | Admitting: Family Medicine

## 2015-05-08 ENCOUNTER — Encounter: Payer: Self-pay | Admitting: Family Medicine

## 2015-05-08 VITALS — BP 117/45 | HR 77 | Wt 106.0 lb

## 2015-05-08 DIAGNOSIS — R1013 Epigastric pain: Secondary | ICD-10-CM | POA: Diagnosis not present

## 2015-05-08 DIAGNOSIS — K30 Functional dyspepsia: Secondary | ICD-10-CM | POA: Diagnosis not present

## 2015-05-08 DIAGNOSIS — N952 Postmenopausal atrophic vaginitis: Secondary | ICD-10-CM

## 2015-05-08 DIAGNOSIS — K2950 Unspecified chronic gastritis without bleeding: Secondary | ICD-10-CM | POA: Diagnosis not present

## 2015-05-08 DIAGNOSIS — D61818 Other pancytopenia: Secondary | ICD-10-CM | POA: Diagnosis not present

## 2015-05-08 DIAGNOSIS — K2970 Gastritis, unspecified, without bleeding: Secondary | ICD-10-CM | POA: Diagnosis not present

## 2015-05-08 MED ORDER — ESTRADIOL 0.1 MG/GM VA CREA: TOPICAL_CREAM | VAGINAL | Status: DC

## 2015-05-08 NOTE — Progress Notes (Signed)
CC: Claire Cox is a 81 y.o. female is here for Follow-up   Subjective: HPI:  Follow-up chronic gastritis: Timex was not helping so her gastroenterologist switched her to dexilant. She's only been on this for one dose she's not sure if it's helping. She believes that the dicyclomine is helping greatly when she gets breakthrough pain and epigastric or suprapubic region. Her bowel movements are moving on a daily basis without any pain. She denies any vomiting or nausea. She is not a candidate for upper endoscopy given how fracture she is right now but will have an upper bowel series tomorrow.  She is quite worried about left vaginal pain, she localizes to the opening of the left vagina. It's been present for a few months now and originally improved with Lotrisone however now seems to be mild in severity on a daily basis. It's worse with sitting down and significantly interfering with quality of life. She denies any dysuria, urinary frequency or vaginal discharge. She is no longer having sex because it's painful to the skin of the vagina. She denies fevers, chills, nor any overlying skin changes or mucosal abnormalities.     Review Of Systems Outlined In HPI  Past Medical History  Diagnosis Date  . Thyroid nodule 01/19/2012    [place] 08/2011, right lob 6. Cm and left lobe 7.0 cm, lobulated mass in left lobe measuring 3.8 cm.  [place] FNA on 02/6760 sowing follicular proliferation of undetermined sig.  Repaet FNA on 10/2011: benign.     . Osteoporosis 01/19/2012    DEXA 08/2011.  T score spine -4.3.  Reclast 08/2011. She had GI upset and irritation with Fosamax. She declines any future Reclast infusions. She also declines any future oral agents.   . Hypothyroid 01/19/2012    Diagnosed February 2013.     Past Surgical History  Procedure Laterality Date  . Cataract extraction w/ intraocular lens implant  09/2011    left eye, defective implant   No family history on file.  Social History   Social History  .  Marital Status: Married    Spouse Name: N/A  . Number of Children: N/A  . Years of Education: N/A   Occupational History  . Not on file.   Social History Main Topics  . Smoking status: Former Research scientist (life sciences)  . Smokeless tobacco: Not on file  . Alcohol Use: Not on file  . Drug Use: Not on file  . Sexual Activity: Not on file   Other Topics Concern  . Not on file   Social History Narrative     Objective: BP 117/45 mmHg  Pulse 77  Wt 106 lb (48.081 kg)  Vital signs reviewed. General: Alert and Oriented, No Acute Distress HEENT: Pupils equal, round, reactive to light. Conjunctivae clear.  External ears unremarkable.  Moist mucous membranes. Lungs: Clear and comfortable work of breathing, speaking in full sentences without accessory muscle use. Cardiac: Regular rate and rhythm.  Neuro: CN II-XII grossly intact, gait normal. Extremities: No peripheral edema.  Strong peripheral pulses.  Mental Status: No depression, anxiety, nor agitation. Logical though process. Skin: Warm and dry. GU: Evonia as chaperone. The vaginal mucosa is unremarkable other than being extremely dry and adherent. There are no visual lesions at the site that she localizes her pain 2. No vaginal discharge.  Assessment & Plan: [name] was seen today for follow-up.  Diagnoses and all orders for this visit:  Vaginal atrophy -     estradiol (ESTRACE VAGINAL) 0.1 MG/GM vaginal cream;  One applicator per vagina two to three times a week (at bedtime) only as needed for vaginal pain.  Chronic gastritis   Vaginal atrophy: Discussed brief use of estradiol to see if this helps reduce the pain of the vaginal region. I've encouraged her to only use this until the pain goes away and then stop to see if she can tolerate not taking this medication given the clotting risk and oncologic risk of using this medication long-term. She wanted to make sure that I was up-to-date on all of her brain cancer treatment and also chronic  gastritis treatment, discussed that I was definitely in agreement with all of the plan set forth through her gastroenterologist and would have to defer all oncologic care to her oncology team.  40 minutes spent face-to-face during visit today of which at least 50% was counseling or coordinating care regarding: 1. Vaginal atrophy   2. Chronic gastritis      Return in about 4 weeks (around 06/05/2015).

## 2015-05-09 ENCOUNTER — Telehealth: Payer: Self-pay

## 2015-05-09 DIAGNOSIS — C712 Malignant neoplasm of temporal lobe: Secondary | ICD-10-CM | POA: Diagnosis not present

## 2015-05-09 DIAGNOSIS — D6959 Other secondary thrombocytopenia: Secondary | ICD-10-CM | POA: Diagnosis not present

## 2015-05-09 DIAGNOSIS — D702 Other drug-induced agranulocytosis: Secondary | ICD-10-CM | POA: Diagnosis not present

## 2015-05-09 DIAGNOSIS — T451X1A Poisoning by antineoplastic and immunosuppressive drugs, accidental (unintentional), initial encounter: Secondary | ICD-10-CM | POA: Diagnosis not present

## 2015-05-09 DIAGNOSIS — C719 Malignant neoplasm of brain, unspecified: Secondary | ICD-10-CM | POA: Diagnosis not present

## 2015-05-09 NOTE — Telephone Encounter (Signed)
Patient called and stated that her MRI results are negative for cancer.

## 2015-05-10 DIAGNOSIS — R1013 Epigastric pain: Secondary | ICD-10-CM | POA: Diagnosis not present

## 2015-05-10 DIAGNOSIS — K224 Dyskinesia of esophagus: Secondary | ICD-10-CM | POA: Diagnosis not present

## 2015-05-11 DIAGNOSIS — R569 Unspecified convulsions: Secondary | ICD-10-CM | POA: Diagnosis not present

## 2015-05-11 DIAGNOSIS — D696 Thrombocytopenia, unspecified: Secondary | ICD-10-CM | POA: Diagnosis not present

## 2015-05-11 DIAGNOSIS — H43811 Vitreous degeneration, right eye: Secondary | ICD-10-CM | POA: Diagnosis not present

## 2015-05-11 DIAGNOSIS — E039 Hypothyroidism, unspecified: Secondary | ICD-10-CM | POA: Diagnosis not present

## 2015-05-11 DIAGNOSIS — C712 Malignant neoplasm of temporal lobe: Secondary | ICD-10-CM | POA: Diagnosis not present

## 2015-05-11 DIAGNOSIS — Z87891 Personal history of nicotine dependence: Secondary | ICD-10-CM | POA: Diagnosis not present

## 2015-05-11 DIAGNOSIS — D709 Neutropenia, unspecified: Secondary | ICD-10-CM | POA: Diagnosis not present

## 2015-05-11 DIAGNOSIS — H35363 Drusen (degenerative) of macula, bilateral: Secondary | ICD-10-CM | POA: Diagnosis not present

## 2015-05-14 DIAGNOSIS — C719 Malignant neoplasm of brain, unspecified: Secondary | ICD-10-CM | POA: Diagnosis not present

## 2015-05-16 DIAGNOSIS — C712 Malignant neoplasm of temporal lobe: Secondary | ICD-10-CM | POA: Diagnosis not present

## 2015-05-17 DIAGNOSIS — F4323 Adjustment disorder with mixed anxiety and depressed mood: Secondary | ICD-10-CM | POA: Diagnosis not present

## 2015-05-21 DIAGNOSIS — C712 Malignant neoplasm of temporal lobe: Secondary | ICD-10-CM | POA: Diagnosis not present

## 2015-05-23 DIAGNOSIS — H9313 Tinnitus, bilateral: Secondary | ICD-10-CM | POA: Diagnosis not present

## 2015-05-23 DIAGNOSIS — J323 Chronic sphenoidal sinusitis: Secondary | ICD-10-CM | POA: Diagnosis not present

## 2015-05-23 DIAGNOSIS — D496 Neoplasm of unspecified behavior of brain: Secondary | ICD-10-CM | POA: Diagnosis not present

## 2015-05-23 DIAGNOSIS — H9193 Unspecified hearing loss, bilateral: Secondary | ICD-10-CM | POA: Diagnosis not present

## 2015-05-25 DIAGNOSIS — D696 Thrombocytopenia, unspecified: Secondary | ICD-10-CM | POA: Diagnosis not present

## 2015-05-25 DIAGNOSIS — E079 Disorder of thyroid, unspecified: Secondary | ICD-10-CM | POA: Diagnosis not present

## 2015-05-25 DIAGNOSIS — D72819 Decreased white blood cell count, unspecified: Secondary | ICD-10-CM | POA: Diagnosis not present

## 2015-05-25 DIAGNOSIS — C712 Malignant neoplasm of temporal lobe: Secondary | ICD-10-CM | POA: Diagnosis not present

## 2015-05-25 DIAGNOSIS — D709 Neutropenia, unspecified: Secondary | ICD-10-CM | POA: Diagnosis not present

## 2015-05-25 DIAGNOSIS — Z87891 Personal history of nicotine dependence: Secondary | ICD-10-CM | POA: Diagnosis not present

## 2015-05-25 DIAGNOSIS — E039 Hypothyroidism, unspecified: Secondary | ICD-10-CM | POA: Diagnosis not present

## 2015-05-25 DIAGNOSIS — R413 Other amnesia: Secondary | ICD-10-CM | POA: Diagnosis not present

## 2015-05-28 ENCOUNTER — Other Ambulatory Visit: Payer: Self-pay | Admitting: Family Medicine

## 2015-06-13 DIAGNOSIS — F4323 Adjustment disorder with mixed anxiety and depressed mood: Secondary | ICD-10-CM | POA: Diagnosis not present

## 2015-06-16 DIAGNOSIS — H409 Unspecified glaucoma: Secondary | ICD-10-CM | POA: Diagnosis not present

## 2015-06-16 DIAGNOSIS — Z9221 Personal history of antineoplastic chemotherapy: Secondary | ICD-10-CM | POA: Diagnosis not present

## 2015-06-16 DIAGNOSIS — Z923 Personal history of irradiation: Secondary | ICD-10-CM | POA: Diagnosis not present

## 2015-06-16 DIAGNOSIS — E079 Disorder of thyroid, unspecified: Secondary | ICD-10-CM | POA: Diagnosis not present

## 2015-06-16 DIAGNOSIS — M810 Age-related osteoporosis without current pathological fracture: Secondary | ICD-10-CM | POA: Diagnosis not present

## 2015-06-16 DIAGNOSIS — Z79899 Other long term (current) drug therapy: Secondary | ICD-10-CM | POA: Diagnosis not present

## 2015-06-16 DIAGNOSIS — K210 Gastro-esophageal reflux disease with esophagitis: Secondary | ICD-10-CM | POA: Diagnosis not present

## 2015-06-16 DIAGNOSIS — Z888 Allergy status to other drugs, medicaments and biological substances status: Secondary | ICD-10-CM | POA: Diagnosis not present

## 2015-06-16 DIAGNOSIS — Z883 Allergy status to other anti-infective agents status: Secondary | ICD-10-CM | POA: Diagnosis not present

## 2015-06-16 DIAGNOSIS — Z882 Allergy status to sulfonamides status: Secondary | ICD-10-CM | POA: Diagnosis not present

## 2015-06-16 DIAGNOSIS — C719 Malignant neoplasm of brain, unspecified: Secondary | ICD-10-CM | POA: Diagnosis not present

## 2015-06-18 ENCOUNTER — Encounter: Payer: Self-pay | Admitting: Family Medicine

## 2015-06-18 ENCOUNTER — Ambulatory Visit (INDEPENDENT_AMBULATORY_CARE_PROVIDER_SITE_OTHER): Payer: Medicare Other | Admitting: Family Medicine

## 2015-06-18 VITALS — BP 107/59 | HR 57 | Wt 103.0 lb

## 2015-06-18 DIAGNOSIS — R591 Generalized enlarged lymph nodes: Secondary | ICD-10-CM

## 2015-06-18 DIAGNOSIS — R41841 Cognitive communication deficit: Secondary | ICD-10-CM | POA: Diagnosis not present

## 2015-06-18 DIAGNOSIS — R413 Other amnesia: Secondary | ICD-10-CM | POA: Diagnosis not present

## 2015-06-18 DIAGNOSIS — K2100 Gastro-esophageal reflux disease with esophagitis, without bleeding: Secondary | ICD-10-CM

## 2015-06-18 DIAGNOSIS — R599 Enlarged lymph nodes, unspecified: Secondary | ICD-10-CM

## 2015-06-18 DIAGNOSIS — R300 Dysuria: Secondary | ICD-10-CM | POA: Diagnosis not present

## 2015-06-18 MED ORDER — AMBULATORY NON FORMULARY MEDICATION: Status: DC

## 2015-06-18 NOTE — Progress Notes (Signed)
CC: Claire Cox is a 81 y.o. female is here for Cyst   Subjective: HPI:  Small mass in the right groin that has been present for matter of months. It seems to fluctuate in size. It was enlarged over the weekend and slightly tender however today has disappeared. She tells me get smaller and larger over the course of a month and has never had any overlying skin changes. She's had some warmth when urinating however denies any other genitourinary complaints. She denies fevers, chills nor trouble finding infections.  Follow GERD: Over the weekend she had a large bowl of ice cream and within a few hours got a return of her sharp epigastric pain. She went to a local emergency room and received a GI cocktail without lidocaine and had almost immediate resolution of her pain. It has not returned. She denies any vomiting diarrhea or constipation.      Review Of Systems Outlined In HPI  Past Medical History  Diagnosis Date  . Thyroid nodule 01/19/2012    [place] 08/2011, right lob 6. Cm and left lobe 7.0 cm, lobulated mass in left lobe measuring 3.8 cm.  [place] FNA on [id number] sowing follicular proliferation of undetermined sig.  Repaet FNA on 10/2011: benign.     . Osteoporosis 01/19/2012    DEXA 08/2011.  T score spine -4.3.  Reclast 08/2011. She had GI upset and irritation with Fosamax. She declines any future Reclast infusions. She also declines any future oral agents.   . Hypothyroid 01/19/2012    Diagnosed February 2013.     Past Surgical History  Procedure Laterality Date  . Cataract extraction w/ intraocular lens implant  09/2011    left eye, defective implant   No family history on file.  Social History   Social History  . Marital Status: Married    Spouse Name: N/A  . Number of Children: N/A  . Years of Education: N/A   Occupational History  . Not on file.   Social History Main Topics  . Smoking status: Former Research scientist (life sciences)  . Smokeless tobacco: Not on file  . Alcohol Use: Not on file  . Drug Use:  Not on file  . Sexual Activity: Not on file   Other Topics Concern  . Not on file   Social History Narrative     Objective: BP 107/59 mmHg  Pulse 57  Wt 103 lb (46.72 kg)  Vital signs reviewed. General: Alert and Oriented, No Acute Distress HEENT: Pupils equal, round, reactive to light. Conjunctivae clear.  External ears unremarkable.  Moist mucous membranes. Lungs: Clear and comfortable work of breathing, speaking in full sentences without accessory muscle use. Cardiac: Regular rate and rhythm.  Neuro: CN II-XII grossly intact, gait normal. Extremities: No peripheral edema.  Strong peripheral pulses.  Mental Status: No depression, anxiety, nor agitation. Logical though process. Skin: Warm and dry.  Assessment & Plan: Joslynn was seen today for cyst.  Diagnoses and all orders for this visit:  Gastroesophageal reflux disease with esophagitis  Dysuria -     Urinalysis, Routine w reflex microscopic -     Urine Culture  Adenopathy  Other orders -     AMBULATORY NON FORMULARY MEDICATION; Maalox  100 ml  Donnatal 50 ml  1 tablespoon TID as needed for abdominal or esophageal discomfort   GERD: Currently controlled with a flareup over the weekend, she was provided with a compounded GI cocktail to help should her pain return. Adenopathy: discussed her lymph node was most likely  reactive in nature and should ever not shrink the next step would be a biopsy. Dysuria: Checking urinalysis and culture.  25 minutes spent face-to-face during visit today of which at least 50% was counseling or coordinating care regarding: 1. Gastroesophageal reflux disease with esophagitis   2. Dysuria   3. Adenopathy      Return if symptoms worsen or fail to improve.

## 2015-06-19 LAB — URINALYSIS, ROUTINE W REFLEX MICROSCOPIC
BILIRUBIN URINE: NEGATIVE
Glucose, UA: NEGATIVE
HGB URINE DIPSTICK: NEGATIVE
KETONES UR: NEGATIVE
Leukocytes, UA: NEGATIVE
Nitrite: NEGATIVE
PH: 5.5 (ref 5.0–8.0)
Protein, ur: NEGATIVE
SPECIFIC GRAVITY, URINE: 1.021 (ref 1.001–1.035)

## 2015-06-20 LAB — URINE CULTURE

## 2015-06-22 DIAGNOSIS — C712 Malignant neoplasm of temporal lobe: Secondary | ICD-10-CM | POA: Diagnosis not present

## 2015-06-26 DIAGNOSIS — F4323 Adjustment disorder with mixed anxiety and depressed mood: Secondary | ICD-10-CM | POA: Diagnosis not present

## 2015-06-29 DIAGNOSIS — C719 Malignant neoplasm of brain, unspecified: Secondary | ICD-10-CM | POA: Diagnosis not present

## 2015-06-29 DIAGNOSIS — Z87891 Personal history of nicotine dependence: Secondary | ICD-10-CM | POA: Diagnosis not present

## 2015-06-29 DIAGNOSIS — R569 Unspecified convulsions: Secondary | ICD-10-CM | POA: Diagnosis not present

## 2015-07-04 DIAGNOSIS — R1013 Epigastric pain: Secondary | ICD-10-CM | POA: Diagnosis not present

## 2015-07-04 DIAGNOSIS — D259 Leiomyoma of uterus, unspecified: Secondary | ICD-10-CM | POA: Diagnosis not present

## 2015-07-04 DIAGNOSIS — R634 Abnormal weight loss: Secondary | ICD-10-CM | POA: Diagnosis not present

## 2015-07-04 DIAGNOSIS — K5730 Diverticulosis of large intestine without perforation or abscess without bleeding: Secondary | ICD-10-CM | POA: Diagnosis not present

## 2015-07-04 DIAGNOSIS — K5900 Constipation, unspecified: Secondary | ICD-10-CM | POA: Diagnosis not present

## 2015-07-05 DIAGNOSIS — K449 Diaphragmatic hernia without obstruction or gangrene: Secondary | ICD-10-CM | POA: Diagnosis not present

## 2015-07-05 DIAGNOSIS — J323 Chronic sphenoidal sinusitis: Secondary | ICD-10-CM | POA: Diagnosis not present

## 2015-07-05 DIAGNOSIS — D496 Neoplasm of unspecified behavior of brain: Secondary | ICD-10-CM | POA: Diagnosis not present

## 2015-07-05 DIAGNOSIS — R1013 Epigastric pain: Secondary | ICD-10-CM | POA: Diagnosis not present

## 2015-07-05 DIAGNOSIS — R634 Abnormal weight loss: Secondary | ICD-10-CM | POA: Diagnosis not present

## 2015-07-06 ENCOUNTER — Ambulatory Visit (INDEPENDENT_AMBULATORY_CARE_PROVIDER_SITE_OTHER): Payer: Medicare Other | Admitting: Family Medicine

## 2015-07-06 ENCOUNTER — Encounter: Payer: Self-pay | Admitting: Family Medicine

## 2015-07-06 VITALS — BP 110/62 | HR 67 | Wt 105.0 lb

## 2015-07-06 DIAGNOSIS — K449 Diaphragmatic hernia without obstruction or gangrene: Secondary | ICD-10-CM

## 2015-07-06 DIAGNOSIS — R1030 Lower abdominal pain, unspecified: Secondary | ICD-10-CM

## 2015-07-06 DIAGNOSIS — R1031 Right lower quadrant pain: Secondary | ICD-10-CM

## 2015-07-06 NOTE — Progress Notes (Signed)
CC: Claire Cox is a 81 y.o. female is here for Discuss Endoscopy Results and Pelvic Pain   Subjective: HPI:   she recently had a upper endoscopy that revealed a sizable hiatal hernia. This is news to her and she did not have an opportunity to ask questions about this to her gastroenterologist. She continued to have occasional epigastric pain however it's improved with using DEXILANT and Bentyl. Symptoms have not gotten worse since I saw her last and possibly has slightly improved. Nothing makes symptoms worse other than eating quickly. No new component or character to her pain.  She also complains of continued right groin pain. It's mild in severity and nothing makes it better or worse. It comes and goes on a daily basis. Not related to bowel habits. Denies any motor or sensory disturbances in the legs.no swelling redness or warmth at the site of discomfort   Review Of Systems Outlined In HPI  Past Medical History  Diagnosis Date  . Thyroid nodule 01/19/2012    [place] 08/2011, right lob 6. Cm and left lobe 7.0 cm, lobulated mass in left lobe measuring 3.8 cm.  [place] FNA on A999333 sowing follicular proliferation of undetermined sig.  Repaet FNA on 10/2011: benign.     . Osteoporosis 01/19/2012    DEXA 08/2011.  T score spine -4.3.  Reclast 08/2011. She had GI upset and irritation with Fosamax. She declines any future Reclast infusions. She also declines any future oral agents.   . Hypothyroid 01/19/2012    Diagnosed February 2013.     Past Surgical History  Procedure Laterality Date  . Cataract extraction w/ intraocular lens implant  09/2011    left eye, defective implant   No family history on file.  Social History   Social History  . Marital Status: Married    Spouse Name: N/A  . Number of Children: N/A  . Years of Education: N/A   Occupational History  . Not on file.   Social History Main Topics  . Smoking status: Former Research scientist (life sciences)  . Smokeless tobacco: Not on file  . Alcohol Use: Not on  file  . Drug Use: Not on file  . Sexual Activity: Not on file   Other Topics Concern  . Not on file   Social History Narrative     Objective: BP 110/62 mmHg  Pulse 67  Wt 105 lb (47.628 kg)  Vital signs reviewed. General: Alert and Oriented, No Acute Distress HEENT: Pupils equal, round, reactive to light. Conjunctivae clear.  External ears unremarkable.  Moist mucous membranes. Lungs: Clear and comfortable work of breathing, speaking in full sentences without accessory muscle use. Cardiac: Regular rate and rhythm.  Neuro: CN II-XII grossly intact, gait normal. Extremities: No peripheral edema.  Strong peripheral pulses.  Mental Status: No depression, anxiety, nor agitation. Logical though process. Skin: Warm and dry. Assessment & Plan: Claire Cox was seen today for discuss endoscopy results and pelvic pain.  Diagnoses and all orders for this visit:  Hiatal hernia  Right groin pain   Discussed the benign nature of a hiatal hernia and that the worst outcome from this condition is chronic epigastric pain. I took time to answer all of her questions and called her nerves that this is not something serious. No change to current antacid medication.  For her right groin pain and gave her a home rehabilitation plan for stretches that will help her with what I suspect is a psoas strain, CT scan was unremarkable earlier this week.  25 minutes spent face-to-face during visit today of which at least 50% was counseling or coordinating care regarding: 1. Hiatal hernia   2. Right groin pain      Return if symptoms worsen or fail to improve.

## 2015-07-07 DIAGNOSIS — Z85841 Personal history of malignant neoplasm of brain: Secondary | ICD-10-CM | POA: Diagnosis not present

## 2015-07-07 DIAGNOSIS — Z8719 Personal history of other diseases of the digestive system: Secondary | ICD-10-CM | POA: Diagnosis not present

## 2015-07-07 DIAGNOSIS — K5900 Constipation, unspecified: Secondary | ICD-10-CM | POA: Diagnosis not present

## 2015-07-07 DIAGNOSIS — K209 Esophagitis, unspecified: Secondary | ICD-10-CM | POA: Diagnosis not present

## 2015-07-07 DIAGNOSIS — R918 Other nonspecific abnormal finding of lung field: Secondary | ICD-10-CM | POA: Diagnosis not present

## 2015-07-07 DIAGNOSIS — Z882 Allergy status to sulfonamides status: Secondary | ICD-10-CM | POA: Diagnosis not present

## 2015-07-07 DIAGNOSIS — Z881 Allergy status to other antibiotic agents status: Secondary | ICD-10-CM | POA: Diagnosis not present

## 2015-07-07 DIAGNOSIS — R079 Chest pain, unspecified: Secondary | ICD-10-CM | POA: Diagnosis not present

## 2015-07-07 DIAGNOSIS — Z79899 Other long term (current) drug therapy: Secondary | ICD-10-CM | POA: Diagnosis not present

## 2015-07-07 DIAGNOSIS — R1013 Epigastric pain: Secondary | ICD-10-CM | POA: Diagnosis not present

## 2015-07-07 DIAGNOSIS — Z888 Allergy status to other drugs, medicaments and biological substances status: Secondary | ICD-10-CM | POA: Diagnosis not present

## 2015-07-07 DIAGNOSIS — Z923 Personal history of irradiation: Secondary | ICD-10-CM | POA: Diagnosis not present

## 2015-07-09 ENCOUNTER — Telehealth: Payer: Self-pay | Admitting: Family Medicine

## 2015-07-09 DIAGNOSIS — R41841 Cognitive communication deficit: Secondary | ICD-10-CM | POA: Diagnosis not present

## 2015-07-09 NOTE — Telephone Encounter (Signed)
Spoke with the pt and she did get an appt to see Dr. Candiss Norse 07/12/15 at 1:30

## 2015-07-09 NOTE — Telephone Encounter (Signed)
Will you please contact patient or husband and ask if she's been contacted by the surgeon's office today?

## 2015-07-11 ENCOUNTER — Telehealth: Payer: Self-pay

## 2015-07-11 DIAGNOSIS — F4323 Adjustment disorder with mixed anxiety and depressed mood: Secondary | ICD-10-CM | POA: Diagnosis not present

## 2015-07-11 MED ORDER — AMOXICILLIN 500 MG PO CAPS: 500.0000 mg | ORAL_CAPSULE | Freq: Two times a day (BID) | ORAL | Status: DC

## 2015-07-11 NOTE — Telephone Encounter (Signed)
Left detailed message with call back information for questions.

## 2015-07-11 NOTE — Telephone Encounter (Signed)
Pt left vm stating that Dr. Jannifer Franklin Rx'd her amoxicillin.  She is currently out of the medication and Dr. Jannifer Franklin is out of town.  She is asking could you fill this medication for her?

## 2015-07-11 NOTE — Telephone Encounter (Signed)
Can you please let patient know that I sent a seven day course of this to her [facility]?

## 2015-07-12 DIAGNOSIS — R41841 Cognitive communication deficit: Secondary | ICD-10-CM | POA: Diagnosis not present

## 2015-07-12 DIAGNOSIS — K449 Diaphragmatic hernia without obstruction or gangrene: Secondary | ICD-10-CM | POA: Diagnosis not present

## 2015-07-24 ENCOUNTER — Encounter: Payer: Self-pay | Admitting: Family Medicine

## 2015-07-24 ENCOUNTER — Ambulatory Visit (INDEPENDENT_AMBULATORY_CARE_PROVIDER_SITE_OTHER): Payer: Medicare Other | Admitting: Family Medicine

## 2015-07-24 VITALS — BP 106/59 | HR 68 | Wt 104.0 lb

## 2015-07-24 DIAGNOSIS — K449 Diaphragmatic hernia without obstruction or gangrene: Secondary | ICD-10-CM | POA: Diagnosis not present

## 2015-07-24 DIAGNOSIS — L309 Dermatitis, unspecified: Secondary | ICD-10-CM | POA: Diagnosis not present

## 2015-07-24 MED ORDER — LEVETIRACETAM 750 MG PO TABS: 750.0000 mg | ORAL_TABLET | Freq: Two times a day (BID) | ORAL | Status: AC

## 2015-07-24 MED ORDER — CLOTRIMAZOLE-BETAMETHASONE 1-0.05 % EX CREA: TOPICAL_CREAM | CUTANEOUS | Status: AC

## 2015-07-24 NOTE — Progress Notes (Signed)
CC: Ralphine Hinks is a 82 y.o. female is here for Hiatal Hernia   Subjective: HPI:  Follow-up hiatal hernia: She drinks have epigastric pain on daily basis. She set up for pH and esophageal manometry next Monday. She's met with a general surgeon and he is willing to do surgery if further testing confirms that she is a surgical candidate. She has a lot of questions about what this pH test and manometry test is all about, how it's obtained and what it'll tell her surgeon and gastric urologist.  Complains of a burning sensation on the skin of her labia that began 2 days ago. She just finished a week's worth of Augmentin that was given to her by her ear nose and throat physician. She denies any vaginal discharge or urinary frequency. She denies any color or odor changes to her hearing. She denies flank pain fevers or chills.   Review Of Systems Outlined In HPI  Past Medical History  Diagnosis Date  . Thyroid nodule 01/19/2012    [place] 08/2011, right lob 6. Cm and left lobe 7.0 cm, lobulated mass in left lobe measuring 3.8 cm.  [place] FNA on 08/3555 sowing follicular proliferation of undetermined sig.  Repaet FNA on 10/2011: benign.     . Osteoporosis 01/19/2012    DEXA 08/2011.  T score spine -4.3.  Reclast 08/2011. She had GI upset and irritation with Fosamax. She declines any future Reclast infusions. She also declines any future oral agents.   . Hypothyroid 01/19/2012    Diagnosed February 2013.     Past Surgical History  Procedure Laterality Date  . Cataract extraction w/ intraocular lens implant  09/2011    left eye, defective implant   No family history on file.  Social History   Social History  . Marital Status: Married    Spouse Name: N/A  . Number of Children: N/A  . Years of Education: N/A   Occupational History  . Not on file.   Social History Main Topics  . Smoking status: Former Research scientist (life sciences)  . Smokeless tobacco: Not on file  . Alcohol Use: Not on file  . Drug Use: Not on file  . Sexual  Activity: Not on file   Other Topics Concern  . Not on file   Social History Narrative     Objective: BP 106/59 mmHg  Pulse 68  Wt 104 lb (47.174 kg)  Vital signs reviewed. General: Alert and Oriented, No Acute Distress HEENT: Pupils equal, round, reactive to light. Conjunctivae clear.  External ears unremarkable.  Moist mucous membranes. Lungs: Clear and comfortable work of breathing, speaking in full sentences without accessory muscle use. Cardiac: Regular rate and rhythm.  Neuro: CN II-XII grossly intact, gait normal. Extremities: No peripheral edema.  Strong peripheral pulses.  Mental Status: No depression, anxiety, nor agitation. Logical though process. Skin: Warm and dry.  Assessment & Plan: Ellyanna was seen today for hiatal hernia.  Diagnoses and all orders for this visit:  Hiatal hernia  Dermatitis  Other orders -     levETIRAcetam (KEPPRA) 750 MG tablet; Take 1 tablet (750 mg total) by mouth 2 (two) times daily. -     clotrimazole-betamethasone (LOTRISONE) cream; Apply to affected area twice a day for two weeks, may require four weeks if involving the feet/toes.    She is due for refills on Keppra without any recent seizure activity Most likely has a yeast infection of the labia therefore start Lotrisone. I'll have her submit a wet prep if  this is not helping by Friday Hiatal hernia: Agree with pursuing surgery if she is a candidate. Time was taken to discuss the 24-hour manometry and pH testing she'll have done next Monday. Time was taken and all of her and her husband's questions.  25 minutes spent face-to-face during visit today of which at least 50% was counseling or coordinating care regarding: 1. Hiatal hernia   2. Dermatitis      Return if symptoms worsen or fail to improve.

## 2015-07-25 DIAGNOSIS — R41841 Cognitive communication deficit: Secondary | ICD-10-CM | POA: Diagnosis not present

## 2015-07-25 DIAGNOSIS — F4323 Adjustment disorder with mixed anxiety and depressed mood: Secondary | ICD-10-CM | POA: Diagnosis not present

## 2015-07-27 ENCOUNTER — Ambulatory Visit (INDEPENDENT_AMBULATORY_CARE_PROVIDER_SITE_OTHER): Payer: Medicare Other | Admitting: Family Medicine

## 2015-07-27 VITALS — BP 109/58 | HR 53 | Wt 105.0 lb

## 2015-07-27 DIAGNOSIS — T5791XA Toxic effect of unspecified inorganic substance, accidental (unintentional), initial encounter: Secondary | ICD-10-CM | POA: Diagnosis not present

## 2015-07-27 DIAGNOSIS — R41841 Cognitive communication deficit: Secondary | ICD-10-CM | POA: Diagnosis not present

## 2015-07-27 DIAGNOSIS — IMO0002 Reserved for concepts with insufficient information to code with codable children: Secondary | ICD-10-CM

## 2015-07-29 ENCOUNTER — Encounter: Payer: Self-pay | Admitting: Family Medicine

## 2015-07-29 NOTE — Progress Notes (Signed)
CC: Claire Cox is a 82 y.o. female is here for No chief complaint on file.   Subjective: HPI:  Yesterday while in a hurry between getting out of bed and getting to her car she accidentally took a sip of a solution that she uses to clean her dentures overnight. She was able to spit out most of it before she actually swallowed it. 2 hours later she began to get nauseous and felt ill, different than her chronic nausea due to her hiatal hernia. She took a Zofran tabletand it helped symptoms go from moderate to mild in severity. Symptoms are persistent since yesterday afternoon on the way until late this morning. She was unable to eat anything due to the nausea and can only take small sips of water. She called poison control and encourage her to stay hydrated. She never vomited and did not have any diarrhea but did have more solid bowel movements than she is used to, she believes was a total of 3. She denies blood in her stool nor any melena. She denies any new abdominal pain. She denies any new confusion, fevers, chills, dysphagia or back pain. Late this morning she regained her appetite and was able to eat fish and some small snacks. She tells that she feels great today but is worried that something could have been permanently damage.   Review Of Systems Outlined In HPI  Past Medical History  Diagnosis Date  . Thyroid nodule 01/19/2012    [place] 08/2011, right lob 6. Cm and left lobe 7.0 cm, lobulated mass in left lobe measuring 3.8 cm.  [place] FNA on A999333 sowing follicular proliferation of undetermined sig.  Repaet FNA on 10/2011: benign.     . Osteoporosis 01/19/2012    DEXA 08/2011.  T score spine -4.3.  Reclast 08/2011. She had GI upset and irritation with Fosamax. She declines any future Reclast infusions. She also declines any future oral agents.   . Hypothyroid 01/19/2012    Diagnosed February 2013.     Past Surgical History  Procedure Laterality Date  . Cataract extraction w/ intraocular lens implant   09/2011    left eye, defective implant   No family history on file.  Social History   Social History  . Marital Status: Married    Spouse Name: N/A  . Number of Children: N/A  . Years of Education: N/A   Occupational History  . Not on file.   Social History Main Topics  . Smoking status: Former Research scientist (life sciences)  . Smokeless tobacco: Not on file  . Alcohol Use: Not on file  . Drug Use: Not on file  . Sexual Activity: Not on file   Other Topics Concern  . Not on file   Social History Narrative     Objective: BP 109/58 mmHg  Pulse 53  Wt 105 lb (47.628 kg)  Vital signs reviewed. General: Alert and Oriented, No Acute Distress HEENT: Pupils equal, round, reactive to light. Conjunctivae clear.  External ears unremarkable.  Moist mucous membranes. Lungs: Clear and comfortable work of breathing, speaking in full sentences without accessory muscle use. Cardiac: Regular rate and rhythm.  Neuro: CN II-XII grossly intact, gait normal. Extremities: No peripheral edema.  Strong peripheral pulses.  Mental Status: No depression, anxiety, nor agitation. Logical though process. Skin: Warm and dry.  Assessment & Plan: Diagnoses and all orders for this visit:  Accidental poisoning   It took a lot of reassurance however I informed her that it's very reassuring that she  feels back to her normal self today. I can only imagine that there was some or many ingredients in the denture solution the caused her to feel nauseous. Thankfully poison control did not feel that she was in a emergent situation. I would her know that if her symptoms come back we can do some blood work to look at her liver and kidney function. I let her know that she can call me at any time on my personal cell phone to have this order placed.  40 minutes spent face-to-face during visit today of which at least 50% was counseling or coordinating care regarding: 1. Accidental poisoning      Return if symptoms worsen or fail to  improve.

## 2015-07-30 DIAGNOSIS — Z79899 Other long term (current) drug therapy: Secondary | ICD-10-CM | POA: Diagnosis not present

## 2015-07-30 DIAGNOSIS — R070 Pain in throat: Secondary | ICD-10-CM | POA: Diagnosis not present

## 2015-07-30 DIAGNOSIS — Z87891 Personal history of nicotine dependence: Secondary | ICD-10-CM | POA: Diagnosis not present

## 2015-07-30 DIAGNOSIS — Z888 Allergy status to other drugs, medicaments and biological substances status: Secondary | ICD-10-CM | POA: Diagnosis not present

## 2015-07-30 DIAGNOSIS — K219 Gastro-esophageal reflux disease without esophagitis: Secondary | ICD-10-CM | POA: Diagnosis not present

## 2015-07-30 DIAGNOSIS — F419 Anxiety disorder, unspecified: Secondary | ICD-10-CM | POA: Diagnosis not present

## 2015-07-30 DIAGNOSIS — Z883 Allergy status to other anti-infective agents status: Secondary | ICD-10-CM | POA: Diagnosis not present

## 2015-07-30 DIAGNOSIS — K30 Functional dyspepsia: Secondary | ICD-10-CM | POA: Diagnosis not present

## 2015-07-30 DIAGNOSIS — K5900 Constipation, unspecified: Secondary | ICD-10-CM | POA: Diagnosis not present

## 2015-07-30 DIAGNOSIS — D61818 Other pancytopenia: Secondary | ICD-10-CM | POA: Diagnosis not present

## 2015-07-30 DIAGNOSIS — R1013 Epigastric pain: Secondary | ICD-10-CM | POA: Diagnosis not present

## 2015-08-02 ENCOUNTER — Ambulatory Visit (INDEPENDENT_AMBULATORY_CARE_PROVIDER_SITE_OTHER): Payer: Medicare Other

## 2015-08-02 ENCOUNTER — Telehealth: Payer: Self-pay | Admitting: Family Medicine

## 2015-08-02 DIAGNOSIS — R059 Cough, unspecified: Secondary | ICD-10-CM

## 2015-08-02 DIAGNOSIS — R05 Cough: Secondary | ICD-10-CM

## 2015-08-02 DIAGNOSIS — J984 Other disorders of lung: Secondary | ICD-10-CM

## 2015-08-02 DIAGNOSIS — R41841 Cognitive communication deficit: Secondary | ICD-10-CM | POA: Diagnosis not present

## 2015-08-02 MED ORDER — LEVOFLOXACIN 500 MG PO TABS: 500.0000 mg | ORAL_TABLET | Freq: Every day | ORAL | Status: DC

## 2015-08-02 NOTE — Telephone Encounter (Signed)
Will you please let patient know that my cell phone is not getting good reception at my home and I'm not getting voice mails until I drive into [place].  To address her productive cough I've sent in a new antibiotic called levofloxacin to her [facility].  My schedule looks full and Of course she has the option to go to urgent care or see one of the other providers here but I'd at least recommend going to our radiology office to get a chest xray done today, I've already put in an order, all she would have to do is show up downstairs.

## 2015-08-02 NOTE — Telephone Encounter (Signed)
Pt.notified

## 2015-08-03 ENCOUNTER — Ambulatory Visit: Payer: Medicare Other | Admitting: Family Medicine

## 2015-08-06 ENCOUNTER — Telehealth: Payer: Self-pay | Admitting: Family Medicine

## 2015-08-06 NOTE — Telephone Encounter (Signed)
Pt.notified

## 2015-08-06 NOTE — Telephone Encounter (Signed)
Patient called me stating that she swallowed a small chicken bone and wants to know if she can take a stool softener.   [place], Will you please let patient know that taking a stool softener such as dulcolax or colace would be ok to take once a day for the next three days.  If she starts to develop any new pain that acts different than her chronic abdominal pain then I would want to get some xrays of the abdomen. The majority of the chicken bone will be broken down by acid and enzymes in her digestive tract.

## 2015-08-07 DIAGNOSIS — Z85841 Personal history of malignant neoplasm of brain: Secondary | ICD-10-CM | POA: Diagnosis not present

## 2015-08-07 DIAGNOSIS — Z9889 Other specified postprocedural states: Secondary | ICD-10-CM | POA: Diagnosis not present

## 2015-08-07 DIAGNOSIS — G9389 Other specified disorders of brain: Secondary | ICD-10-CM | POA: Diagnosis not present

## 2015-08-07 DIAGNOSIS — C719 Malignant neoplasm of brain, unspecified: Secondary | ICD-10-CM | POA: Diagnosis not present

## 2015-08-07 DIAGNOSIS — R41841 Cognitive communication deficit: Secondary | ICD-10-CM | POA: Diagnosis not present

## 2015-08-07 DIAGNOSIS — Z923 Personal history of irradiation: Secondary | ICD-10-CM | POA: Diagnosis not present

## 2015-08-07 DIAGNOSIS — Z08 Encounter for follow-up examination after completed treatment for malignant neoplasm: Secondary | ICD-10-CM | POA: Diagnosis not present

## 2015-08-07 DIAGNOSIS — R109 Unspecified abdominal pain: Secondary | ICD-10-CM | POA: Diagnosis not present

## 2015-08-07 DIAGNOSIS — Z9221 Personal history of antineoplastic chemotherapy: Secondary | ICD-10-CM | POA: Diagnosis not present

## 2015-08-09 ENCOUNTER — Encounter: Payer: Self-pay | Admitting: Family Medicine

## 2015-08-09 ENCOUNTER — Telehealth: Payer: Self-pay

## 2015-08-09 ENCOUNTER — Ambulatory Visit (INDEPENDENT_AMBULATORY_CARE_PROVIDER_SITE_OTHER): Payer: Medicare Other | Admitting: Family Medicine

## 2015-08-09 VITALS — BP 112/66 | HR 79 | Temp 97.8°F | Wt 103.0 lb

## 2015-08-09 DIAGNOSIS — F411 Generalized anxiety disorder: Secondary | ICD-10-CM | POA: Diagnosis not present

## 2015-08-09 DIAGNOSIS — R05 Cough: Secondary | ICD-10-CM | POA: Diagnosis not present

## 2015-08-09 DIAGNOSIS — R059 Cough, unspecified: Secondary | ICD-10-CM

## 2015-08-09 MED ORDER — CLONAZEPAM 0.5 MG PO TABS: 0.2500 mg | ORAL_TABLET | Freq: Every day | ORAL | Status: DC

## 2015-08-09 MED ORDER — CEFDINIR 300 MG PO CAPS: 300.0000 mg | ORAL_CAPSULE | Freq: Two times a day (BID) | ORAL | Status: DC

## 2015-08-09 NOTE — Telephone Encounter (Addendum)
Pt called reporting that she is having pain.  She said when you asked her during her visit she did not realize the pain until she left the office.  She wonders if this pain is caused by her cough?

## 2015-08-09 NOTE — Progress Notes (Signed)
CC: Claire Cox is a 82 y.o. female is here for Cough and Nasal Congestion   Subjective: HPI:   productive cough  Present for the last week and a half. Slightly improved with Levaquin but still persistent. Present on a daily basis and interfering with sleep. No cough syrups as of yet due to fears of interaction with other medicines.  No blood in sputum. Symptoms are worse at night. She denies chest pain shortness of breath orthopnea or peripheral edema. Denies fever   requesting refills on clonazepam. She's been taking this every night before going to bed. If she does not take it she has hours of restlessness before she actually falls asleep. She denies any known side effects. There's been no falls or disorientation. She denies any depression or any other mental disturbance   She needs help interpreting recent results from esophageal manometry and pH testing.   Review Of Systems Outlined In HPI  Past Medical History  Diagnosis Date  . Thyroid nodule 01/19/2012    [place] 08/2011, right lob 6. Cm and left lobe 7.0 cm, lobulated mass in left lobe measuring 3.8 cm.  [place] FNA on A999333 sowing follicular proliferation of undetermined sig.  Repaet FNA on 10/2011: benign.     . Osteoporosis 01/19/2012    DEXA 08/2011.  T score spine -4.3.  Reclast 08/2011. She had GI upset and irritation with Fosamax. She declines any future Reclast infusions. She also declines any future oral agents.   . Hypothyroid 01/19/2012    Diagnosed February 2013.     Past Surgical History  Procedure Laterality Date  . Cataract extraction w/ intraocular lens implant  09/2011    left eye, defective implant   No family history on file.  Social History   Social History  . Marital Status: Married    Spouse Name: N/A  . Number of Children: N/A  . Years of Education: N/A   Occupational History  . Not on file.   Social History Main Topics  . Smoking status: Former Research scientist (life sciences)  . Smokeless tobacco: Not on file  . Alcohol Use: Not  on file  . Drug Use: Not on file  . Sexual Activity: Not on file   Other Topics Concern  . Not on file   Social History Narrative     Objective: BP 112/66 mmHg  Pulse 79  Temp(Src) 97.8 F (36.6 C) (Oral)  Wt 103 lb (46.72 kg)  General: Alert and Oriented, No Acute Distress HEENT: Pupils equal, round, reactive to light. Conjunctivae clear.  External ears unremarkable, canals clear with intact TMs with appropriate landmarks.  Middle ear appears open without effusion. Pink inferior turbinates.  Moist mucous membranes, pharynx without inflammation nor lesions.  Neck supple without palpable lymphadenopathy nor abnormal masses. Lungs: Clear to auscultation bilaterally, no wheezing/ronchi/rales.  Comfortable work of breathing. Good air movement. Frequent coughing Extremities: No peripheral edema.  Strong peripheral pulses.  Mental Status: No depression, anxiety, nor agitation. Skin: Warm and dry.  Assessment & Plan: Claire Cox was seen today for cough and nasal congestion.  Diagnoses and all orders for this visit:  Cough -     cefdinir (OMNICEF) 300 MG capsule; Take 1 capsule (300 mg total) by mouth 2 (two) times daily.  Anxiety state -     clonazePAM (KLONOPIN) 0.5 MG tablet; Take 0.5 tablets (0.25 mg total) by mouth at bedtime.    anxiety and insomnia: controlled with clonazepam, used only at bedtime  Cough: Stop Levaquin and begin Ceftin ear.  I've encouraged her to start taking Robitussin DM to help with cough.  time was taken to go over her manometry and pH testing which essentially shows that acid reflux is not the source of her chest discomfort and esophageal spasms are also not the source of her chest discomfort.  40 minutes spent face-to-face during visit today of which at least 50% was counseling or coordinating care regarding: 1. Cough   2. Anxiety state      Return if symptoms worsen or fail to improve.

## 2015-08-10 DIAGNOSIS — R569 Unspecified convulsions: Secondary | ICD-10-CM | POA: Diagnosis not present

## 2015-08-10 DIAGNOSIS — D61811 Other drug-induced pancytopenia: Secondary | ICD-10-CM | POA: Diagnosis not present

## 2015-08-10 DIAGNOSIS — E039 Hypothyroidism, unspecified: Secondary | ICD-10-CM | POA: Diagnosis not present

## 2015-08-10 DIAGNOSIS — C719 Malignant neoplasm of brain, unspecified: Secondary | ICD-10-CM | POA: Diagnosis not present

## 2015-08-10 DIAGNOSIS — Z87891 Personal history of nicotine dependence: Secondary | ICD-10-CM | POA: Diagnosis not present

## 2015-08-10 DIAGNOSIS — K219 Gastro-esophageal reflux disease without esophagitis: Secondary | ICD-10-CM | POA: Diagnosis not present

## 2015-08-10 MED ORDER — TRAMADOL HCL 50 MG PO TABS: 50.0000 mg | ORAL_TABLET | Freq: Three times a day (TID) | ORAL | Status: DC | PRN

## 2015-08-10 NOTE — Telephone Encounter (Signed)
Husband notified  

## 2015-08-10 NOTE — Telephone Encounter (Signed)
I'm sure at least part of her pain is caused by her cough, she was having some really significant coughing fits when here in the office.  I'd recommend trying a medication called tramadol if needed for pain, Rx in your in box.

## 2015-08-13 DIAGNOSIS — R41841 Cognitive communication deficit: Secondary | ICD-10-CM | POA: Diagnosis not present

## 2015-08-15 ENCOUNTER — Encounter: Payer: Self-pay | Admitting: Family Medicine

## 2015-08-15 ENCOUNTER — Ambulatory Visit (INDEPENDENT_AMBULATORY_CARE_PROVIDER_SITE_OTHER): Payer: Medicare Other | Admitting: Family Medicine

## 2015-08-15 VITALS — BP 113/60 | HR 64 | Wt 105.0 lb

## 2015-08-15 DIAGNOSIS — B379 Candidiasis, unspecified: Secondary | ICD-10-CM | POA: Diagnosis not present

## 2015-08-15 DIAGNOSIS — R41841 Cognitive communication deficit: Secondary | ICD-10-CM | POA: Diagnosis not present

## 2015-08-15 NOTE — Progress Notes (Signed)
CC: Claire Cox is a 82 y.o. female is here for Bumps Under Right Arm   Subjective: HPI:  Red itchy rash under both armpits noticed this morning. Mild in severity. Scratching but no other interventions. Scratching makes it worse. Denies fevers, chills, skin changes elsewhere, nausea.    Review Of Systems Outlined In HPI  Past Medical History  Diagnosis Date  . Thyroid nodule 01/19/2012    [place] 08/2011, right lob 6. Cm and left lobe 7.0 cm, lobulated mass in left lobe measuring 3.8 cm.  [place] FNA on A999333 sowing follicular proliferation of undetermined sig.  Repaet FNA on 10/2011: benign.     . Osteoporosis 01/19/2012    DEXA 08/2011.  T score spine -4.3.  Reclast 08/2011. She had GI upset and irritation with Fosamax. She declines any future Reclast infusions. She also declines any future oral agents.   . Hypothyroid 01/19/2012    Diagnosed February 2013.     Past Surgical History  Procedure Laterality Date  . Cataract extraction w/ intraocular lens implant  09/2011    left eye, defective implant   No family history on file.  Social History   Social History  . Marital Status: Married    Spouse Name: N/A  . Number of Children: N/A  . Years of Education: N/A   Occupational History  . Not on file.   Social History Main Topics  . Smoking status: Former Research scientist (life sciences)  . Smokeless tobacco: Not on file  . Alcohol Use: Not on file  . Drug Use: Not on file  . Sexual Activity: Not on file   Other Topics Concern  . Not on file   Social History Narrative     Objective: BP 113/60 mmHg  Pulse 64  Wt 105 lb (47.628 kg)  General: Alert and Oriented, No Acute Distress HEENT: Pupils equal, round, reactive to light. Conjunctivae clear.  Moist mucous membranes Lungs: Clear to auscultation bilaterally Cardiac: Regular rate and rhythm Extremities: No peripheral edema.  Strong peripheral pulses.  Mental Status: No depression, anxiety, nor agitation. Skin: Warm and dry. Erythematous patch in both  axillae with red satellite lesions.   Assessment & Plan: Claire Cox was seen today for bumps under right arm.  Diagnoses and all orders for this visit:  Candidiasis  Start clotrimazole-betamethasone BID which she already has, likely due to recent ABX use.  25 minutes spent face-to-face during visit today of which at least 50% was counseling or coordinating care regarding: 1. Candidiasis       Return if symptoms worsen or fail to improve.

## 2015-08-16 DIAGNOSIS — F4323 Adjustment disorder with mixed anxiety and depressed mood: Secondary | ICD-10-CM | POA: Diagnosis not present

## 2015-08-17 DIAGNOSIS — K449 Diaphragmatic hernia without obstruction or gangrene: Secondary | ICD-10-CM | POA: Diagnosis not present

## 2015-08-20 ENCOUNTER — Telehealth: Payer: Self-pay | Admitting: Family Medicine

## 2015-08-20 DIAGNOSIS — R41841 Cognitive communication deficit: Secondary | ICD-10-CM | POA: Diagnosis not present

## 2015-08-20 NOTE — Telephone Encounter (Signed)
Pt advised.

## 2015-08-20 NOTE — Telephone Encounter (Signed)
Will you please let patient know that if she is still having trouble with her cough then I'd recommend she come in for a nurse visit to receive an injection of Depo-medrol 80mg  IM.  Also I don't get cell phone reception at my house so if it's the weekend or after hours there is always a doctor on call if she calls our main office line and waits for the prompt to connect to our answering service.

## 2015-08-22 DIAGNOSIS — R41841 Cognitive communication deficit: Secondary | ICD-10-CM | POA: Diagnosis not present

## 2015-08-28 DIAGNOSIS — F4323 Adjustment disorder with mixed anxiety and depressed mood: Secondary | ICD-10-CM | POA: Diagnosis not present

## 2015-08-29 ENCOUNTER — Ambulatory Visit (INDEPENDENT_AMBULATORY_CARE_PROVIDER_SITE_OTHER): Payer: Medicare Other | Admitting: Family Medicine

## 2015-08-29 ENCOUNTER — Encounter: Payer: Self-pay | Admitting: Family Medicine

## 2015-08-29 VITALS — BP 112/56 | HR 57 | Temp 97.7°F | Wt 105.0 lb

## 2015-08-29 DIAGNOSIS — R3915 Urgency of urination: Secondary | ICD-10-CM

## 2015-08-29 DIAGNOSIS — N9089 Other specified noninflammatory disorders of vulva and perineum: Secondary | ICD-10-CM

## 2015-08-29 DIAGNOSIS — R1310 Dysphagia, unspecified: Secondary | ICD-10-CM | POA: Diagnosis not present

## 2015-08-29 DIAGNOSIS — R05 Cough: Secondary | ICD-10-CM

## 2015-08-29 DIAGNOSIS — R059 Cough, unspecified: Secondary | ICD-10-CM

## 2015-08-29 LAB — POCT URINALYSIS DIPSTICK
Bilirubin, UA: NEGATIVE
Blood, UA: NEGATIVE
Glucose, UA: NEGATIVE
KETONES UA: NEGATIVE
LEUKOCYTES UA: NEGATIVE
NITRITE UA: NEGATIVE
PH UA: 6
PROTEIN UA: NEGATIVE
Spec Grav, UA: 1.01
Urobilinogen, UA: 0.2

## 2015-08-29 NOTE — Progress Notes (Signed)
CC: Claire Cox is a 82 y.o. female is here for Urinary Tract Infection and Cough   Subjective: HPI:  She noticed that her beclomethasone/clotrimazole PI warns against using on the vagina.  She has questions about the safety of using on her labia minora. It stillseems to be helping with her occasional dryness and irritation.  Denies any vaginal bleeding, vaginal discharge or pelvic pain.  Complains of urinary urgency once in the past week.  This has coincided with increased fluid intake which seems to help her reflux symptoms. She denies any dysuria. She is afraid she has a urinary tract infection. There has been no flank pain, fevers, chills or nausea.  Complains of a cough that has been lingering ever since she saw me last. She states that she coughs to 3 times a day but no longer has coughing fits. Occasionally will bring up a small amount of clear sputum but never with blood or color. She denies any wheezing, shortness of breath or chest discomfort.  Her Keppra has changed formulations and is now a dry tablet that she has trouble swallowing. She denies choking but she feels like it sometimes gets stuck in the back of her throat. She denies any difficulty swallowing any other solids or fluids. She denies any regurgitation or vomiting.   Review Of Systems Outlined In HPI  Past Medical History  Diagnosis Date  . Thyroid nodule 01/19/2012    [place] 08/2011, right lob 6. Cm and left lobe 7.0 cm, lobulated mass in left lobe measuring 3.8 cm.  [place] FNA on A999333 sowing follicular proliferation of undetermined sig.  Repaet FNA on 10/2011: benign.     . Osteoporosis 01/19/2012    DEXA 08/2011.  T score spine -4.3.  Reclast 08/2011. She had GI upset and irritation with Fosamax. She declines any future Reclast infusions. She also declines any future oral agents.   . Hypothyroid 01/19/2012    Diagnosed February 2013.     Past Surgical History  Procedure Laterality Date  . Cataract extraction w/ intraocular  lens implant  09/2011    left eye, defective implant   No family history on file.  Social History   Social History  . Marital Status: Married    Spouse Name: N/A  . Number of Children: N/A  . Years of Education: N/A   Occupational History  . Not on file.   Social History Main Topics  . Smoking status: Former Research scientist (life sciences)  . Smokeless tobacco: Not on file  . Alcohol Use: Not on file  . Drug Use: Not on file  . Sexual Activity: Not on file   Other Topics Concern  . Not on file   Social History Narrative     Objective: BP 112/56 mmHg  Pulse 57  Temp(Src) 97.7 F (36.5 C) (Oral)  Wt 105 lb (47.628 kg)  Vital signs reviewed. General: Alert and Oriented, No Acute Distress HEENT: Pupils equal, round, reactive to light. Conjunctivae clear.  External ears unremarkable.  Moist mucous membranes. Lungs: Clear and comfortable work of breathing, speaking in full sentences without accessory muscle use. Cardiac: Regular rate and rhythm.  Neuro: CN II-XII grossly intact, gait normal. Extremities: No peripheral edema.  Strong peripheral pulses.  Mental Status: No depression, anxiety, nor agitation. Logical though process. Skin: Warm and dry.  Assessment & Plan: Rochella was seen today for urinary tract infection and cough.  Diagnoses and all orders for this visit:  Urinary urgency -     POCT urinalysis dipstick -  Urine culture  Labia irritation  Cough  Pill dysphagia   Urinary urgency: Urinalysis is unremarkable, reassurance provided a very low suspicion for UTI however I will follow culture to ensure I have not missed anything. Labia irritation: Controlled with betamethasone/clotrimazole. Continue to use on an as-needed basis. I went over the product insert with her and possible side effects and make sure she understood that she is not at risk for using this medication around her vagina but it is advised against putting within the vaginal canal. Time was taken to answer all  of her questions that she had regarding the lengthy product insert for this medication. Cough: no sign of infection at this time, discussed with solving, to have a cough for 2-3 weeks especially a nonproductive one after a respiratory infection. Pill dysphagia: Recommended using a small amount of olive oil coating her new Keppra formulation to help easy passage down the back of the throat.  40 minutes spent face-to-face during visit today of which at least 50% was counseling or coordinating care regarding: 1. Urinary urgency   2. Labia irritation   3. Cough   4. Pill dysphagia      Return if symptoms worsen or fail to improve.

## 2015-08-31 DIAGNOSIS — R41841 Cognitive communication deficit: Secondary | ICD-10-CM | POA: Diagnosis not present

## 2015-08-31 LAB — URINE CULTURE
COLONY COUNT: NO GROWTH
ORGANISM ID, BACTERIA: NO GROWTH

## 2015-09-04 DIAGNOSIS — R41841 Cognitive communication deficit: Secondary | ICD-10-CM | POA: Diagnosis not present

## 2015-09-10 ENCOUNTER — Ambulatory Visit (INDEPENDENT_AMBULATORY_CARE_PROVIDER_SITE_OTHER): Payer: Medicare Other | Admitting: Family Medicine

## 2015-09-10 ENCOUNTER — Encounter: Payer: Self-pay | Admitting: Family Medicine

## 2015-09-10 VITALS — BP 123/68 | HR 71 | Temp 98.6°F | Wt 107.0 lb

## 2015-09-10 DIAGNOSIS — L309 Dermatitis, unspecified: Secondary | ICD-10-CM

## 2015-09-10 DIAGNOSIS — N390 Urinary tract infection, site not specified: Secondary | ICD-10-CM | POA: Diagnosis not present

## 2015-09-10 LAB — POCT URINALYSIS DIPSTICK
BILIRUBIN UA: NEGATIVE
Glucose, UA: NEGATIVE
KETONES UA: NEGATIVE
Leukocytes, UA: NEGATIVE
Nitrite, UA: NEGATIVE
PH UA: 5.5
Protein, UA: NEGATIVE
RBC UA: NEGATIVE
Spec Grav, UA: 1.025
Urobilinogen, UA: 0.2

## 2015-09-10 MED ORDER — TRIAMCINOLONE ACETONIDE 0.1 % EX OINT: 1.0000 "application " | TOPICAL_OINTMENT | Freq: Two times a day (BID) | CUTANEOUS | Status: AC

## 2015-09-10 MED ORDER — NITROFURANTOIN MONOHYD MACRO 100 MG PO CAPS: 100.0000 mg | ORAL_CAPSULE | Freq: Two times a day (BID) | ORAL | Status: DC

## 2015-09-10 NOTE — Addendum Note (Signed)
Addended by: Delrae Alfred on: 09/10/2015 05:03 PM   Modules accepted: Orders

## 2015-09-10 NOTE — Progress Notes (Signed)
CC: Claire Cox is a 82 y.o. female is here for Urinary Tract Infection   Subjective: HPI:  Patient presents with concerns regarding possible UTI. She's having a urinary urgency, dysuria, and a sensation that there is a "melon in my bladder". She is waking to urinate and this is out of character for her. She denies fevers, chills, flank pain, blood in urine. She denies nausea or vomiting.  She also some redness on her vagina at a location that she showed me last year. She's been using beclomethasone lotion however the stinging when she applies lotion is not allowing her to do it twice a day as prescribed. She denies any skin lesions elsewhere or swollen lymph nodes.  She has many questions regarding more information on a hiatal hernia and if she should limit the volume of what she is because of this. She is worried that she might not be able to gain weight because of this condition.   Review Of Systems Outlined In HPI  Past Medical History  Diagnosis Date  . Thyroid nodule 01/19/2012    [place] 08/2011, right lob 6. Cm and left lobe 7.0 cm, lobulated mass in left lobe measuring 3.8 cm.  [place] FNA on A999333 sowing follicular proliferation of undetermined sig.  Repaet FNA on 10/2011: benign.     . Osteoporosis 01/19/2012    DEXA 08/2011.  T score spine -4.3.  Reclast 08/2011. She had GI upset and irritation with Fosamax. She declines any future Reclast infusions. She also declines any future oral agents.   . Hypothyroid 01/19/2012    Diagnosed February 2013.     Past Surgical History  Procedure Laterality Date  . Cataract extraction w/ intraocular lens implant  09/2011    left eye, defective implant   No family history on file.  Social History   Social History  . Marital Status: Married    Spouse Name: N/A  . Number of Children: N/A  . Years of Education: N/A   Occupational History  . Not on file.   Social History Main Topics  . Smoking status: Former Research scientist (life sciences)  . Smokeless tobacco: Not on  file  . Alcohol Use: Not on file  . Drug Use: Not on file  . Sexual Activity: Not on file   Other Topics Concern  . Not on file   Social History Narrative     Objective: BP 123/68 mmHg  Pulse 71  Temp(Src) 98.6 F (37 C) (Oral)  Wt 107 lb (48.535 kg)  Vital signs reviewed. General: Alert and Oriented, No Acute Distress HEENT: Pupils equal, round, reactive to light. Conjunctivae clear.  External ears unremarkable.  Moist mucous membranes. Lungs: Clear and comfortable work of breathing, speaking in full sentences without accessory muscle use. Cardiac: Regular rate and rhythm.  Neuro: CN II-XII grossly intact, gait normal. Extremities: No peripheral edema.  Strong peripheral pulses.  Mental Status: No depression, anxiety, nor agitation. Logical though process. Skin: Warm and dry.  Assessment & Plan: [name] was seen today for urinary tract infection.  Diagnoses and all orders for this visit:  UTI (lower urinary tract infection) -     nitrofurantoin, macrocrystal-monohydrate, (MACROBID) 100 MG capsule; Take 1 capsule (100 mg total) by mouth 2 (two) times daily. -     Urine culture  Dermatitis -     triamcinolone ointment (KENALOG) 0.1 %; Apply 1 application topically 2 (two) times daily.   Mild suspicion for UTI, start Macrobid pending culture. Vaginal dermatitis: Start triamcinolone ointment instead of  prior lotion/cream preparations Time was taken to answer all of her questions regarding the hiatal hernia and to make sure that she understood the anatomy of this. We had to use a number of different images until she fully grasp the condition.  40 minutes spent face-to-face during visit today of which at least 50% was counseling or coordinating care regarding: 1. UTI (lower urinary tract infection)   2. Dermatitis      Return if symptoms worsen or fail to improve.

## 2015-09-12 DIAGNOSIS — R1084 Generalized abdominal pain: Secondary | ICD-10-CM | POA: Diagnosis not present

## 2015-09-12 DIAGNOSIS — K219 Gastro-esophageal reflux disease without esophagitis: Secondary | ICD-10-CM | POA: Diagnosis not present

## 2015-09-12 DIAGNOSIS — R634 Abnormal weight loss: Secondary | ICD-10-CM | POA: Diagnosis not present

## 2015-09-12 LAB — URINE CULTURE
Colony Count: NO GROWTH
Organism ID, Bacteria: NO GROWTH

## 2015-09-14 DIAGNOSIS — R41841 Cognitive communication deficit: Secondary | ICD-10-CM | POA: Diagnosis not present

## 2015-09-19 DIAGNOSIS — R41841 Cognitive communication deficit: Secondary | ICD-10-CM | POA: Diagnosis not present

## 2015-09-21 DIAGNOSIS — R569 Unspecified convulsions: Secondary | ICD-10-CM | POA: Diagnosis not present

## 2015-09-21 DIAGNOSIS — R109 Unspecified abdominal pain: Secondary | ICD-10-CM | POA: Diagnosis not present

## 2015-09-21 DIAGNOSIS — R413 Other amnesia: Secondary | ICD-10-CM | POA: Diagnosis not present

## 2015-09-21 DIAGNOSIS — C719 Malignant neoplasm of brain, unspecified: Secondary | ICD-10-CM | POA: Diagnosis not present

## 2015-09-21 DIAGNOSIS — D61810 Antineoplastic chemotherapy induced pancytopenia: Secondary | ICD-10-CM | POA: Diagnosis not present

## 2015-09-21 DIAGNOSIS — Z87891 Personal history of nicotine dependence: Secondary | ICD-10-CM | POA: Diagnosis not present

## 2015-09-21 DIAGNOSIS — C712 Malignant neoplasm of temporal lobe: Secondary | ICD-10-CM | POA: Diagnosis not present

## 2015-09-24 ENCOUNTER — Ambulatory Visit (INDEPENDENT_AMBULATORY_CARE_PROVIDER_SITE_OTHER): Payer: Medicare Other | Admitting: Family Medicine

## 2015-09-24 ENCOUNTER — Encounter: Payer: Self-pay | Admitting: Family Medicine

## 2015-09-24 VITALS — BP 120/72 | HR 66 | Wt 112.0 lb

## 2015-09-24 DIAGNOSIS — N760 Acute vaginitis: Secondary | ICD-10-CM

## 2015-09-24 DIAGNOSIS — B9689 Other specified bacterial agents as the cause of diseases classified elsewhere: Secondary | ICD-10-CM

## 2015-09-24 DIAGNOSIS — A499 Bacterial infection, unspecified: Secondary | ICD-10-CM

## 2015-09-24 MED ORDER — METRONIDAZOLE 0.75 % VA GEL: VAGINAL | Status: DC

## 2015-09-24 NOTE — Progress Notes (Signed)
CC: Claire Cox is a 82 y.o. female is here for Vaginal Pain   Subjective: HPI:  External vaginal pain going off and on for the past 4-5 months. Only mild benefit from topical triamcinolone cream.  No vaginal discharge but she does report a fishy odor for the past week in the vaginal area. Discomfort is only present when sitting down. Mild in severity. She denies any genitourinary complaints other than that described above. Denies fevers, chills or rash.   Review Of Systems Outlined In HPI  Past Medical History  Diagnosis Date  . Thyroid nodule 01/19/2012    [place] 08/2011, right lob 6. Cm and left lobe 7.0 cm, lobulated mass in left lobe measuring 3.8 cm.  [place] FNA on A999333 sowing follicular proliferation of undetermined sig.  Repaet FNA on 10/2011: benign.     . Osteoporosis 01/19/2012    DEXA 08/2011.  T score spine -4.3.  Reclast 08/2011. She had GI upset and irritation with Fosamax. She declines any future Reclast infusions. She also declines any future oral agents.   . Hypothyroid 01/19/2012    Diagnosed February 2013.     Past Surgical History  Procedure Laterality Date  . Cataract extraction w/ intraocular lens implant  09/2011    left eye, defective implant   No family history on file.  Social History   Social History  . Marital Status: Married    Spouse Name: N/A  . Number of Children: N/A  . Years of Education: N/A   Occupational History  . Not on file.   Social History Main Topics  . Smoking status: Former Research scientist (life sciences)  . Smokeless tobacco: Not on file  . Alcohol Use: Not on file  . Drug Use: Not on file  . Sexual Activity: Not on file   Other Topics Concern  . Not on file   Social History Narrative     Objective: BP 120/72 mmHg  Pulse 66  Wt 112 lb (50.803 kg)  Vital signs reviewed. General: Alert and Oriented, No Acute Distress HEENT: Pupils equal, round, reactive to light. Conjunctivae clear.  External ears unremarkable.  Moist mucous membranes. Lungs: Clear and  comfortable work of breathing, speaking in full sentences without accessory muscle use. Cardiac: Regular rate and rhythm.  Neuro: CN II-XII grossly intact, gait normal. Extremities: No peripheral edema.  Strong peripheral pulses.  Mental Status: No depression, anxiety, nor agitation. Logical though process.requires frequent redirection back to chief complaint. Skin: Warm and dry. Genitourinary: Claire Cox present as chaperone. No abnormalities on the external vagina, urethra is unremarkable, mild vaginal atrophy.   Assessment & Plan: Claire Cox was seen today for vaginal pain.  Diagnoses and all orders for this visit:  BV (bacterial vaginosis) -     metroNIDAZOLE (METROGEL) 0.75 % vaginal gel; One applicatorful delivered into vagina every evening for five days.   Suspected bacterial vaginosis therefore start metronidazole. Reassurance provided that do not see any signs of skin infection or external abnormality of the vagina between her legs which she localizes her pain.  25 minutes spent face-to-face during visit today of which at least 50% was counseling or coordinating care regarding: 1. BV (bacterial vaginosis)      Return if symptoms worsen or fail to improve.

## 2015-09-28 ENCOUNTER — Encounter: Payer: Self-pay | Admitting: *Deleted

## 2015-09-28 DIAGNOSIS — R41841 Cognitive communication deficit: Secondary | ICD-10-CM | POA: Diagnosis not present

## 2015-10-03 ENCOUNTER — Other Ambulatory Visit: Payer: Self-pay

## 2015-10-03 ENCOUNTER — Telehealth: Payer: Self-pay | Admitting: Family Medicine

## 2015-10-03 NOTE — Telephone Encounter (Signed)
Will you please contact patient and ask her if she still needs a refill on one of her medications that was lost during the move?  If so can she clarify which medication she needs?

## 2015-10-04 NOTE — Telephone Encounter (Signed)
Tried contacting twice and the phone just keeps ringing.  No vm setup.

## 2015-10-10 DIAGNOSIS — F4323 Adjustment disorder with mixed anxiety and depressed mood: Secondary | ICD-10-CM | POA: Diagnosis not present

## 2015-10-19 ENCOUNTER — Ambulatory Visit (INDEPENDENT_AMBULATORY_CARE_PROVIDER_SITE_OTHER): Payer: Medicare Other | Admitting: Family Medicine

## 2015-10-19 ENCOUNTER — Encounter: Payer: Self-pay | Admitting: Family Medicine

## 2015-10-19 VITALS — BP 126/74 | HR 56 | Wt 115.0 lb

## 2015-10-19 DIAGNOSIS — B9689 Other specified bacterial agents as the cause of diseases classified elsewhere: Secondary | ICD-10-CM

## 2015-10-19 DIAGNOSIS — A499 Bacterial infection, unspecified: Secondary | ICD-10-CM

## 2015-10-19 DIAGNOSIS — N760 Acute vaginitis: Secondary | ICD-10-CM | POA: Diagnosis not present

## 2015-10-19 NOTE — Progress Notes (Signed)
CC: Claire Cox is a 82 y.o. female is here for No chief complaint on file.   Subjective: HPI:   She scheduled today's appointment to discuss some chest discomfort on the right side of her chest however this resolved after 2 days without any intervention and has been gone for at least 24 hours now. She would like to go over her recent diagnosis of bacterial vaginosis. She's been using metronidazole as prescribed and she is concerned that maybe something could be wrong because it had an adhesive quality to it where it would dry out and cause her walls of the vagina to stick together. She was able to remedy this by using a warm washcloth to clean herself in the vaginal area. She denies any vaginal discharge or odor. The fishy odor is now completely resolved. She denies any dysuria, fevers, chills, nor gastrointestinal complaints.  Review Of Systems Outlined In HPI  Past Medical History  Diagnosis Date  . Thyroid nodule 01/19/2012    [place] 08/2011, right lob 6. Cm and left lobe 7.0 cm, lobulated mass in left lobe measuring 3.8 cm.  [place] FNA on A999333 sowing follicular proliferation of undetermined sig.  Repaet FNA on 10/2011: benign.     . Osteoporosis 01/19/2012    DEXA 08/2011.  T score spine -4.3.  Reclast 08/2011. She had GI upset and irritation with Fosamax. She declines any future Reclast infusions. She also declines any future oral agents.   . Hypothyroid 01/19/2012    Diagnosed February 2013.     Past Surgical History  Procedure Laterality Date  . Cataract extraction w/ intraocular lens implant  09/2011    left eye, defective implant   No family history on file.  Social History   Social History  . Marital Status: Married    Spouse Name: N/A  . Number of Children: N/A  . Years of Education: N/A   Occupational History  . Not on file.   Social History Main Topics  . Smoking status: Former Research scientist (life sciences)  . Smokeless tobacco: Not on file  . Alcohol Use: Not on file  . Drug Use: Not on file  .  Sexual Activity: Not on file   Other Topics Concern  . Not on file   Social History Narrative     Objective: BP 126/74 mmHg  Pulse 56  Wt 115 lb (52.164 kg)  Vital signs reviewed. General: Alert and Oriented, No Acute Distress HEENT: Pupils equal, round, reactive to light. Conjunctivae clear.  External ears unremarkable.  Moist mucous membranes. Lungs: Clear and comfortable work of breathing, speaking in full sentences without accessory muscle use. Cardiac: Regular rate and rhythm.  Neuro: CN II-XII grossly intact, gait normal. Extremities: No peripheral edema.  Strong peripheral pulses.  Mental Status: No depression, anxiety, nor agitation. Logical though process. Skin: Warm and dry.  Assessment & Plan: Diagnoses and all orders for this visit:  Bacterial vaginosis   Reassurance provided that bacterial vaginosis can be considered resolved since she used the metronidazole as prescribed. Time was taken to answer all of her questions about physiology regarding her age and decreased vaginal secretions which is going to lead to a dry vagina.  25 minutes spent face-to-face during visit today of which at least 50% was counseling or coordinating care regarding: 1. Bacterial vaginosis        Return if symptoms worsen or fail to improve.

## 2015-10-23 DIAGNOSIS — F4323 Adjustment disorder with mixed anxiety and depressed mood: Secondary | ICD-10-CM | POA: Diagnosis not present

## 2015-10-25 DIAGNOSIS — R41841 Cognitive communication deficit: Secondary | ICD-10-CM | POA: Diagnosis not present

## 2015-10-26 ENCOUNTER — Ambulatory Visit (INDEPENDENT_AMBULATORY_CARE_PROVIDER_SITE_OTHER): Payer: Medicare Other | Admitting: Family Medicine

## 2015-10-26 ENCOUNTER — Encounter: Payer: Self-pay | Admitting: Family Medicine

## 2015-10-26 VITALS — BP 119/67 | HR 58 | Wt 113.0 lb

## 2015-10-26 DIAGNOSIS — K449 Diaphragmatic hernia without obstruction or gangrene: Secondary | ICD-10-CM | POA: Diagnosis not present

## 2015-10-26 DIAGNOSIS — M940 Chondrocostal junction syndrome [Tietze]: Secondary | ICD-10-CM

## 2015-10-26 NOTE — Progress Notes (Signed)
CC: Claire Cox is a 82 y.o. female is here for Pelvic Pain; tender under both arms; and tender under both breast   Subjective: HPI:  Complains of a sharp discomfort on her breastbone that occurs if she is doing crunches or not sitting up straight. It comes on suddenly and goes away within a fraction of the second. It's nonradiating. It never occurs if she sitting up erect. She doesn't know what to do about this and has had no interventions as of yet. It began yesterday when she was leaning forward to get out of her car. She denies shortness of breath, arm pain, limb claudication, nor numbness of the upper extremities. She denies any difficulty breathing or wheezing but did have some cough earlier this week which has resolved.  She also wants to know what food she can eat and what food she should avoid due to her hiatal hernia. If she eats anything that's citrusy or has tomato paste and it she gets intolerable reflux and epigastric discomfort.  Review Of Systems Outlined In HPI  Past Medical History  Diagnosis Date  . Thyroid nodule 01/19/2012    [place] 08/2011, right lob 6. Cm and left lobe 7.0 cm, lobulated mass in left lobe measuring 3.8 cm.  [place] FNA on A999333 sowing follicular proliferation of undetermined sig.  Repaet FNA on 10/2011: benign.     . Osteoporosis 01/19/2012    DEXA 08/2011.  T score spine -4.3.  Reclast 08/2011. She had GI upset and irritation with Fosamax. She declines any future Reclast infusions. She also declines any future oral agents.   . Hypothyroid 01/19/2012    Diagnosed February 2013.     Past Surgical History  Procedure Laterality Date  . Cataract extraction w/ intraocular lens implant  09/2011    left eye, defective implant   No family history on file.  Social History   Social History  . Marital Status: Married    Spouse Name: N/A  . Number of Children: N/A  . Years of Education: N/A   Occupational History  . Not on file.   Social History Main Topics  .  Smoking status: Former Research scientist (life sciences)  . Smokeless tobacco: Not on file  . Alcohol Use: Not on file  . Drug Use: Not on file  . Sexual Activity: Not on file   Other Topics Concern  . Not on file   Social History Narrative     Objective: BP 119/67 mmHg  Pulse 58  Wt 113 lb (51.256 kg)  Vital signs reviewed. General: Alert and Oriented, No Acute Distress HEENT: Pupils equal, round, reactive to light. Conjunctivae clear.  External ears unremarkable.  Moist mucous membranes. Lungs: Clear and comfortable work of breathing, speaking in full sentences without accessory muscle use. Cardiac: Regular rate and rhythm.  Neuro: CN II-XII grossly intact, gait normal. Extremities: No peripheral edema.  Strong peripheral pulses.  Mental Status: No depression, anxiety, nor agitation. Logical though process. Skin: Warm and dry.  Assessment & Plan: Claire Cox was seen today for pelvic pain, tender under both arms and tender under both breast.  Diagnoses and all orders for this visit:  Costochondritis  Hiatal hernia   Costochondritis: Discussed benign nature of this condition and that can be improved with stretching of the chest wall muscles. Time was taken to show her stretches focusing on the pectoralis muscles to help reduce inflammation of the cartilage in the chest. I also took the time to answer all of her husband's questions regarding what  foods to avoid to reduce worsening gastroesophageal reflux disease. In a nutshell encouraged her to take thick liquids over thin liquids, less acidic food compared to more acidic food, and small frequent meals as opposed to infrequent large meals.  40 minutes spent face-to-face during visit today of which at least 50% was counseling or coordinating care regarding: 1. Costochondritis   2. Hiatal hernia      No Follow-up on file.

## 2015-10-30 DIAGNOSIS — C712 Malignant neoplasm of temporal lobe: Secondary | ICD-10-CM | POA: Diagnosis not present

## 2015-10-30 DIAGNOSIS — R413 Other amnesia: Secondary | ICD-10-CM | POA: Diagnosis not present

## 2015-10-30 DIAGNOSIS — D696 Thrombocytopenia, unspecified: Secondary | ICD-10-CM | POA: Diagnosis not present

## 2015-10-30 DIAGNOSIS — R41841 Cognitive communication deficit: Secondary | ICD-10-CM | POA: Diagnosis not present

## 2015-10-30 DIAGNOSIS — R41844 Frontal lobe and executive function deficit: Secondary | ICD-10-CM | POA: Diagnosis not present

## 2015-10-30 DIAGNOSIS — H538 Other visual disturbances: Secondary | ICD-10-CM | POA: Diagnosis not present

## 2015-10-30 DIAGNOSIS — R569 Unspecified convulsions: Secondary | ICD-10-CM | POA: Diagnosis not present

## 2015-10-30 DIAGNOSIS — R4689 Other symptoms and signs involving appearance and behavior: Secondary | ICD-10-CM | POA: Diagnosis not present

## 2015-11-02 DIAGNOSIS — Z85841 Personal history of malignant neoplasm of brain: Secondary | ICD-10-CM | POA: Diagnosis not present

## 2015-11-02 DIAGNOSIS — C719 Malignant neoplasm of brain, unspecified: Secondary | ICD-10-CM | POA: Diagnosis not present

## 2015-11-02 DIAGNOSIS — Z043 Encounter for examination and observation following other accident: Secondary | ICD-10-CM | POA: Diagnosis not present

## 2015-11-02 DIAGNOSIS — M8588 Other specified disorders of bone density and structure, other site: Secondary | ICD-10-CM | POA: Diagnosis not present

## 2015-11-02 DIAGNOSIS — R569 Unspecified convulsions: Secondary | ICD-10-CM | POA: Diagnosis not present

## 2015-11-02 DIAGNOSIS — M25531 Pain in right wrist: Secondary | ICD-10-CM | POA: Diagnosis not present

## 2015-11-02 DIAGNOSIS — W19XXXA Unspecified fall, initial encounter: Secondary | ICD-10-CM | POA: Diagnosis not present

## 2015-11-02 DIAGNOSIS — M79621 Pain in right upper arm: Secondary | ICD-10-CM | POA: Diagnosis not present

## 2015-11-02 DIAGNOSIS — Z87891 Personal history of nicotine dependence: Secondary | ICD-10-CM | POA: Diagnosis not present

## 2015-11-02 DIAGNOSIS — Z9889 Other specified postprocedural states: Secondary | ICD-10-CM | POA: Diagnosis not present

## 2015-11-02 DIAGNOSIS — R51 Headache: Secondary | ICD-10-CM | POA: Diagnosis not present

## 2015-11-02 DIAGNOSIS — M25511 Pain in right shoulder: Secondary | ICD-10-CM | POA: Diagnosis not present

## 2015-11-02 DIAGNOSIS — E039 Hypothyroidism, unspecified: Secondary | ICD-10-CM | POA: Diagnosis not present

## 2015-11-05 DIAGNOSIS — C712 Malignant neoplasm of temporal lobe: Secondary | ICD-10-CM | POA: Diagnosis not present

## 2015-11-05 DIAGNOSIS — C719 Malignant neoplasm of brain, unspecified: Secondary | ICD-10-CM | POA: Diagnosis not present

## 2015-11-06 DIAGNOSIS — F4323 Adjustment disorder with mixed anxiety and depressed mood: Secondary | ICD-10-CM | POA: Diagnosis not present

## 2015-11-07 ENCOUNTER — Encounter: Payer: Self-pay | Admitting: Family Medicine

## 2015-11-07 ENCOUNTER — Ambulatory Visit (INDEPENDENT_AMBULATORY_CARE_PROVIDER_SITE_OTHER): Payer: Medicare Other | Admitting: Family Medicine

## 2015-11-07 VITALS — BP 122/68 | HR 72 | Wt 115.0 lb

## 2015-11-07 DIAGNOSIS — R05 Cough: Secondary | ICD-10-CM | POA: Diagnosis not present

## 2015-11-07 DIAGNOSIS — R059 Cough, unspecified: Secondary | ICD-10-CM

## 2015-11-07 MED ORDER — MONTELUKAST SODIUM 10 MG PO TABS: 10.0000 mg | ORAL_TABLET | Freq: Every day | ORAL | Status: AC

## 2015-11-07 NOTE — Progress Notes (Signed)
CC: Claire Cox is a 82 y.o. female is here for Cough   Subjective: HPI:  Nonproductive cough that has been present since Monday of this week. Is not getting better or worse but staying persistent mild to moderate degree on a daily basis. It's worse at night when lying down. She's tried vitamin C but doesn't seem to help. She should use a humidifier but doesn't seem to help. She denies fever, chills, wheezing, shortness of breath, chest discomfort or facial pressure. Review of systems positive for mucus dripping down the back of her throat.   Review Of Systems Outlined In HPI  Past Medical History  Diagnosis Date  . Thyroid nodule 01/19/2012    [place] 08/2011, right lob 6. Cm and left lobe 7.0 cm, lobulated mass in left lobe measuring 3.8 cm.  [place] FNA on A999333 sowing follicular proliferation of undetermined sig.  Repaet FNA on 10/2011: benign.     . Osteoporosis 01/19/2012    DEXA 08/2011.  T score spine -4.3.  Reclast 08/2011. She had GI upset and irritation with Fosamax. She declines any future Reclast infusions. She also declines any future oral agents.   . Hypothyroid 01/19/2012    Diagnosed February 2013.     Past Surgical History  Procedure Laterality Date  . Cataract extraction w/ intraocular lens implant  09/2011    left eye, defective implant   No family history on file.  Social History   Social History  . Marital Status: Married    Spouse Name: N/A  . Number of Children: N/A  . Years of Education: N/A   Occupational History  . Not on file.   Social History Main Topics  . Smoking status: Former Research scientist (life sciences)  . Smokeless tobacco: Not on file  . Alcohol Use: Not on file  . Drug Use: Not on file  . Sexual Activity: Not on file   Other Topics Concern  . Not on file   Social History Narrative     Objective: BP 122/68 mmHg  Pulse 72  Wt 115 lb (52.164 kg)  General: Alert and Oriented, No Acute Distress HEENT: Pupils equal, round, reactive to light. Conjunctivae clear.   External ears unremarkable, canals clear with intact TMs with appropriate landmarks.  Middle ear appears open without effusion. Pink inferior turbinates.  Moist mucous membranes, pharynx without inflammation nor lesions.  Neck supple without palpable lymphadenopathy nor abnormal masses. Lungs: Clear to auscultation bilaterally, no wheezing/ronchi/rales.  Comfortable work of breathing. Good air movement. Extremities: No peripheral edema.  Strong peripheral pulses.  Mental Status: No depression, anxiety, nor agitation. Skin: Warm and dry.  Assessment & Plan: Alainnah was seen today for cough.  Diagnoses and all orders for this visit:  Cough  Other orders -     montelukast (SINGULAIR) 10 MG tablet; Take 1 tablet (10 mg total) by mouth at bedtime.  Cough most likely due to postnasal drip caused by environmental allergens therefore start Singulair and Zyrtec. Consider nasal saline washes.  25 minutes spent face-to-face during visit today of which at least 50% was counseling or coordinating care regarding: 1. Cough      Return if symptoms worsen or fail to improve.

## 2015-11-12 IMAGING — CR DG ABDOMEN 2V
3 series · 3 of 3 positions shown · non-contrast
Comparison: None.

CLINICAL DATA: Constipation

EXAM:
ABDOMEN - 2 VIEW

[abdomen erect]
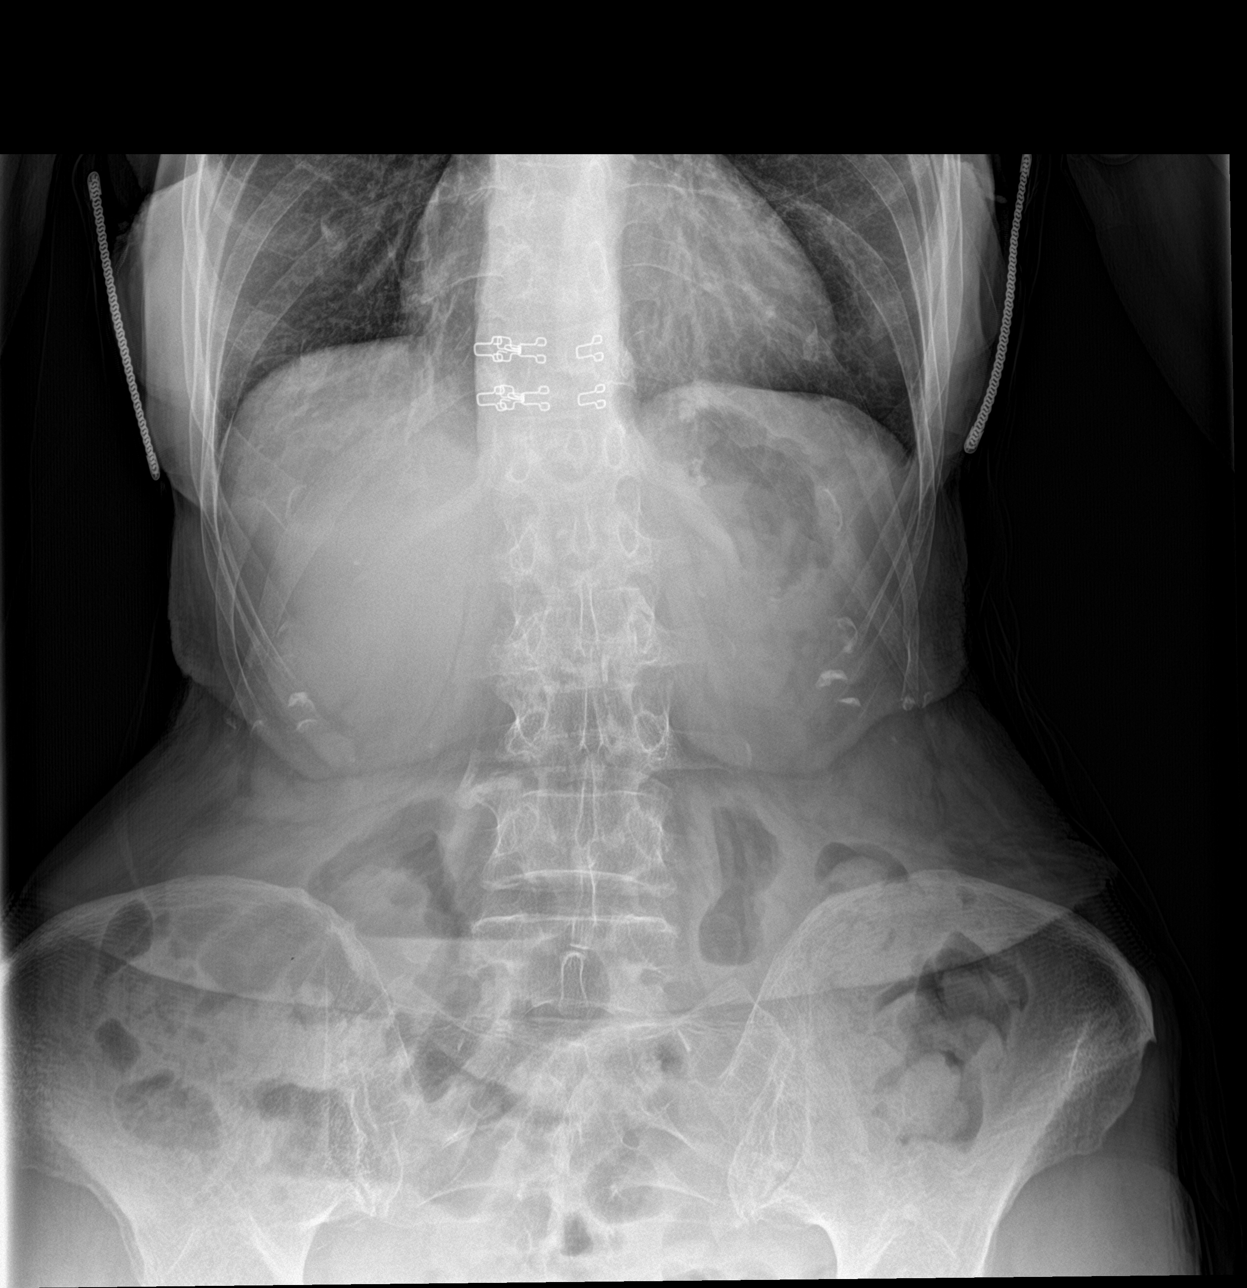

[abdomen supine (1 of 2)]
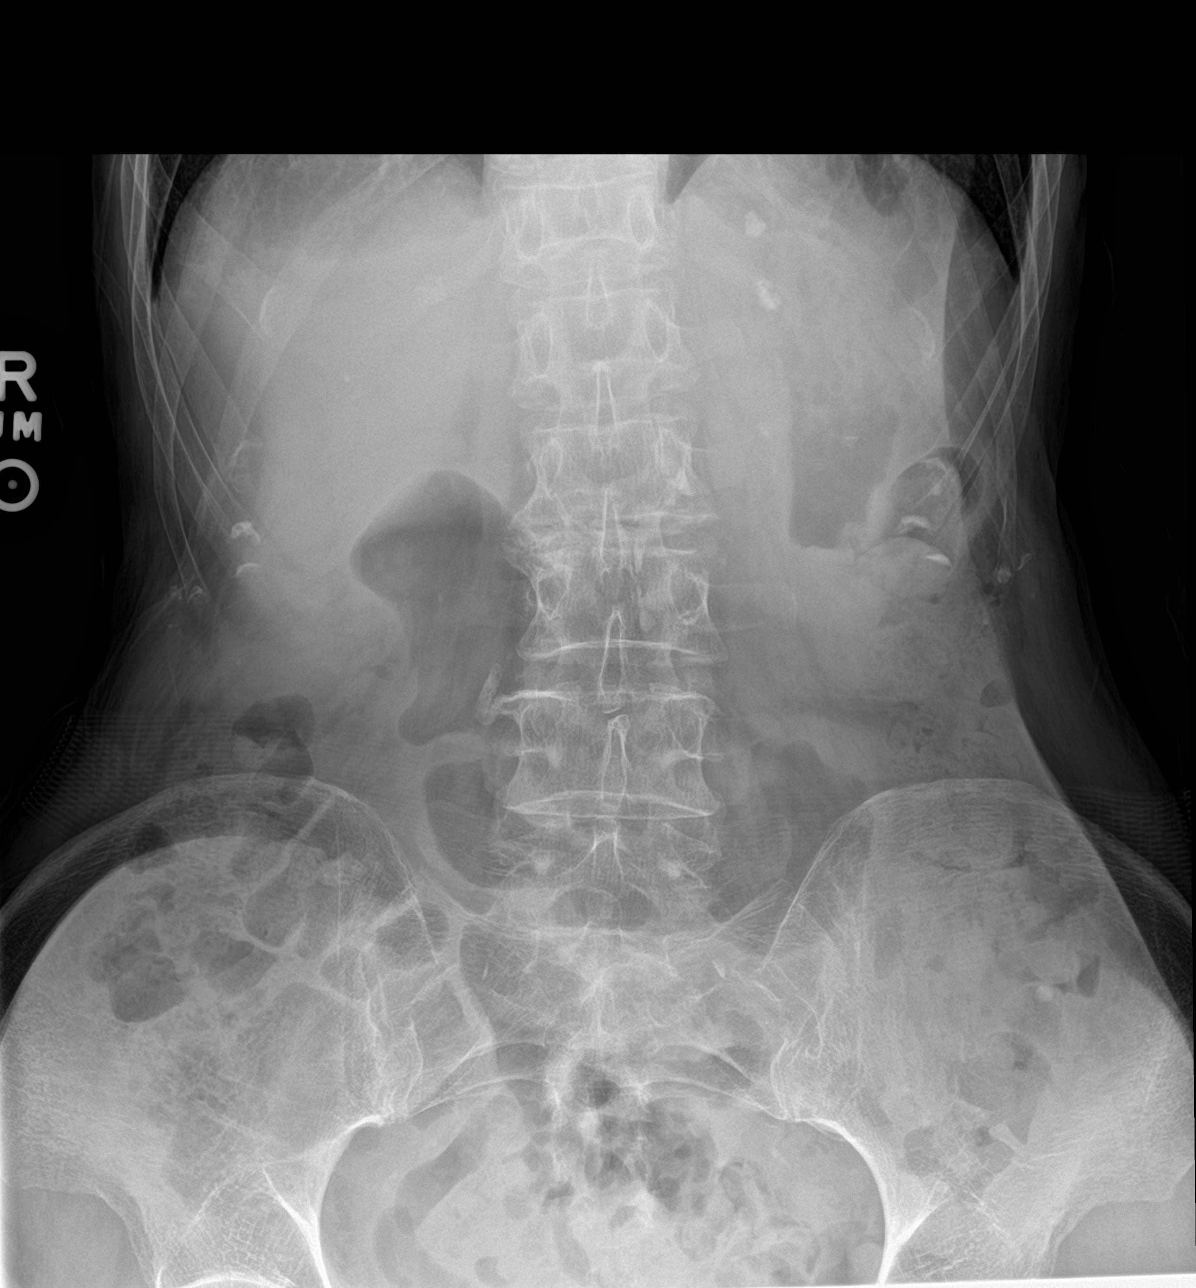

[abdomen supine (2 of 2)]
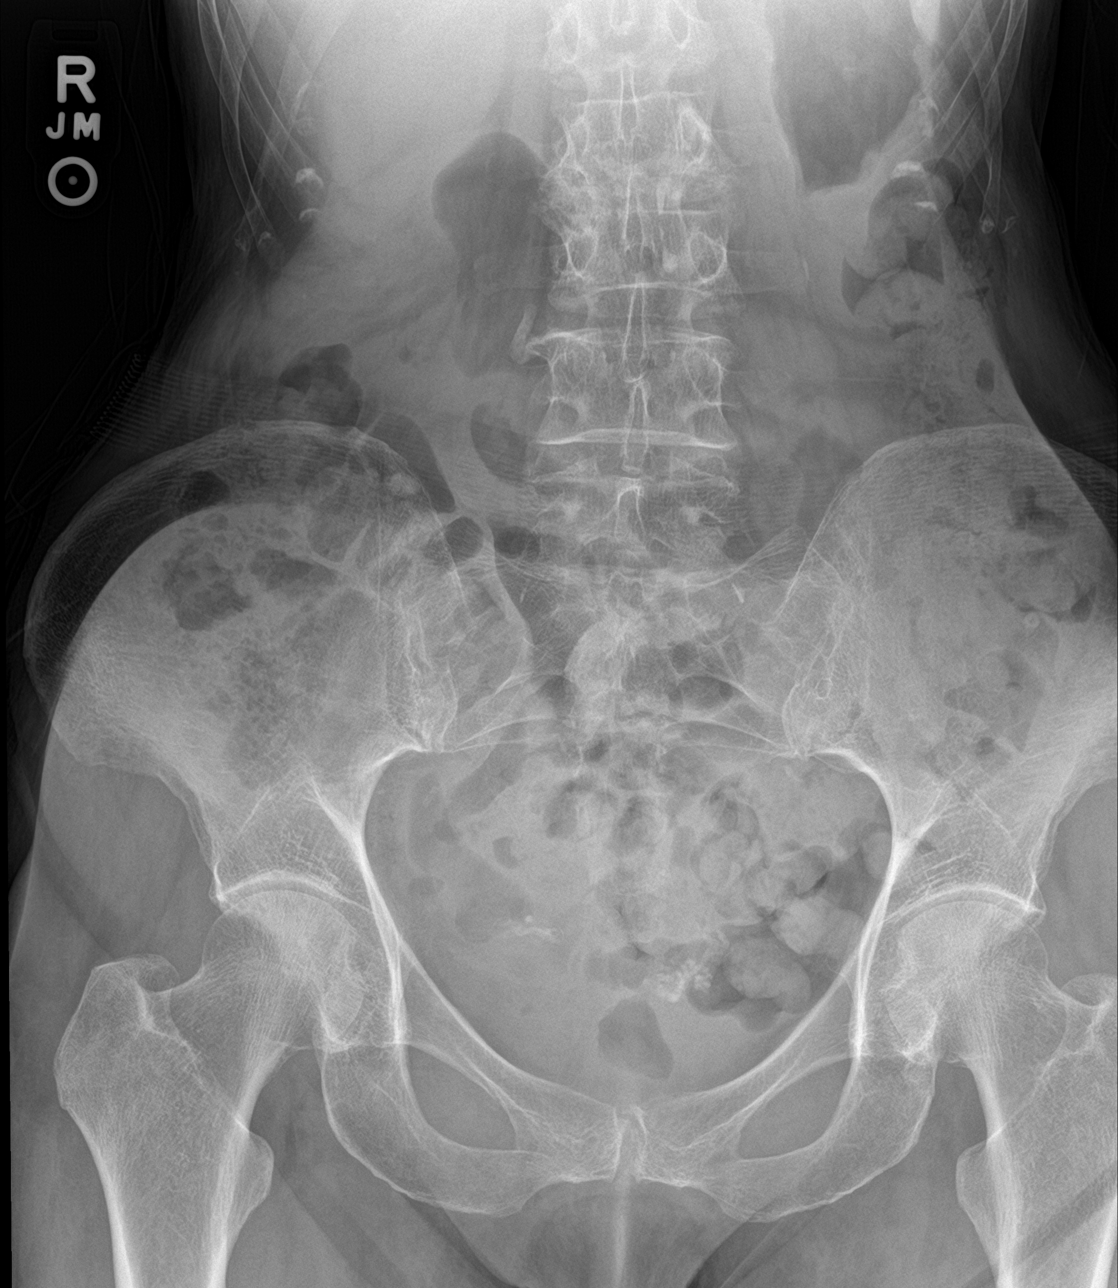

[3 of 3 positions shown; findings below may reference images not displayed]

FINDINGS: Supine and erect views of the abdomen show no bowel obstruction. No
free air seen. A moderate amount of feces is noted throughout the
colon. No opaque calculi are seen. No bony abnormality is noted.
IMPRESSION: 1. Moderate amount of feces throughout the colon.
2. No bowel obstruction.  No free air.

## 2015-11-14 DIAGNOSIS — R41844 Frontal lobe and executive function deficit: Secondary | ICD-10-CM | POA: Diagnosis not present

## 2015-11-14 DIAGNOSIS — H538 Other visual disturbances: Secondary | ICD-10-CM | POA: Diagnosis not present

## 2015-11-14 DIAGNOSIS — Z9889 Other specified postprocedural states: Secondary | ICD-10-CM | POA: Diagnosis not present

## 2015-11-14 DIAGNOSIS — C719 Malignant neoplasm of brain, unspecified: Secondary | ICD-10-CM | POA: Diagnosis not present

## 2015-11-14 DIAGNOSIS — R569 Unspecified convulsions: Secondary | ICD-10-CM | POA: Diagnosis not present

## 2015-11-19 DIAGNOSIS — R41841 Cognitive communication deficit: Secondary | ICD-10-CM | POA: Diagnosis not present

## 2015-11-19 DIAGNOSIS — D61810 Antineoplastic chemotherapy induced pancytopenia: Secondary | ICD-10-CM | POA: Diagnosis not present

## 2015-11-19 DIAGNOSIS — C719 Malignant neoplasm of brain, unspecified: Secondary | ICD-10-CM | POA: Diagnosis not present

## 2015-11-19 DIAGNOSIS — Z87891 Personal history of nicotine dependence: Secondary | ICD-10-CM | POA: Diagnosis not present

## 2015-11-20 ENCOUNTER — Other Ambulatory Visit: Payer: Self-pay

## 2015-11-20 MED ORDER — DEXLANSOPRAZOLE 60 MG PO CPDR: 60.0000 mg | DELAYED_RELEASE_CAPSULE | Freq: Every day | ORAL | Status: DC

## 2015-11-27 ENCOUNTER — Ambulatory Visit (INDEPENDENT_AMBULATORY_CARE_PROVIDER_SITE_OTHER): Payer: Medicare Other | Admitting: Family Medicine

## 2015-11-27 ENCOUNTER — Encounter: Payer: Self-pay | Admitting: Family Medicine

## 2015-11-27 VITALS — BP 117/72 | HR 59 | Wt 115.0 lb

## 2015-11-27 DIAGNOSIS — R519 Headache, unspecified: Secondary | ICD-10-CM

## 2015-11-27 DIAGNOSIS — R51 Headache: Secondary | ICD-10-CM | POA: Diagnosis not present

## 2015-11-27 DIAGNOSIS — G8929 Other chronic pain: Secondary | ICD-10-CM

## 2015-11-27 DIAGNOSIS — F411 Generalized anxiety disorder: Secondary | ICD-10-CM

## 2015-11-27 MED ORDER — MAGNESIUM 400 MG PO CAPS: ORAL_CAPSULE | ORAL | Status: AC

## 2015-11-27 MED ORDER — CLONAZEPAM 0.5 MG PO TABS: 0.2500 mg | ORAL_TABLET | Freq: Every day | ORAL | Status: AC
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
## 2015-11-30 DIAGNOSIS — Z9851 Tubal ligation status: Secondary | ICD-10-CM | POA: Diagnosis not present

## 2015-11-30 DIAGNOSIS — Z881 Allergy status to other antibiotic agents status: Secondary | ICD-10-CM | POA: Diagnosis not present

## 2015-11-30 DIAGNOSIS — Z85841 Personal history of malignant neoplasm of brain: Secondary | ICD-10-CM | POA: Diagnosis not present

## 2015-11-30 DIAGNOSIS — Z9889 Other specified postprocedural states: Secondary | ICD-10-CM | POA: Diagnosis not present

## 2015-11-30 DIAGNOSIS — Z821 Family history of blindness and visual loss: Secondary | ICD-10-CM | POA: Diagnosis not present

## 2015-11-30 DIAGNOSIS — Z87891 Personal history of nicotine dependence: Secondary | ICD-10-CM | POA: Diagnosis not present

## 2015-11-30 DIAGNOSIS — Z800 Family history of malignant neoplasm of digestive organs: Secondary | ICD-10-CM | POA: Diagnosis not present

## 2015-11-30 DIAGNOSIS — H43811 Vitreous degeneration, right eye: Secondary | ICD-10-CM | POA: Diagnosis not present

## 2015-11-30 DIAGNOSIS — Z83518 Family history of other specified eye disorder: Secondary | ICD-10-CM | POA: Diagnosis not present

## 2015-11-30 DIAGNOSIS — Z9049 Acquired absence of other specified parts of digestive tract: Secondary | ICD-10-CM | POA: Diagnosis not present

## 2015-11-30 DIAGNOSIS — Z9841 Cataract extraction status, right eye: Secondary | ICD-10-CM | POA: Diagnosis not present

## 2015-11-30 DIAGNOSIS — Z888 Allergy status to other drugs, medicaments and biological substances status: Secondary | ICD-10-CM | POA: Diagnosis not present

## 2015-11-30 DIAGNOSIS — H35363 Drusen (degenerative) of macula, bilateral: Secondary | ICD-10-CM | POA: Diagnosis not present

## 2015-11-30 DIAGNOSIS — Z961 Presence of intraocular lens: Secondary | ICD-10-CM | POA: Diagnosis not present

## 2015-11-30 DIAGNOSIS — Z9842 Cataract extraction status, left eye: Secondary | ICD-10-CM | POA: Diagnosis not present

## 2015-11-30 DIAGNOSIS — Z79899 Other long term (current) drug therapy: Secondary | ICD-10-CM | POA: Diagnosis not present

## 2015-11-30 DIAGNOSIS — C719 Malignant neoplasm of brain, unspecified: Secondary | ICD-10-CM | POA: Diagnosis not present

## 2015-11-30 DIAGNOSIS — Z8249 Family history of ischemic heart disease and other diseases of the circulatory system: Secondary | ICD-10-CM | POA: Diagnosis not present

## 2015-11-30 DIAGNOSIS — H40039 Anatomical narrow angle, unspecified eye: Secondary | ICD-10-CM | POA: Diagnosis not present

## 2015-11-30 DIAGNOSIS — Z823 Family history of stroke: Secondary | ICD-10-CM | POA: Diagnosis not present

## 2015-11-30 DIAGNOSIS — L818 Other specified disorders of pigmentation: Secondary | ICD-10-CM | POA: Diagnosis not present

## 2015-12-03 DIAGNOSIS — R41841 Cognitive communication deficit: Secondary | ICD-10-CM | POA: Diagnosis not present

## 2015-12-04 ENCOUNTER — Encounter: Payer: Self-pay | Admitting: Family Medicine

## 2015-12-04 ENCOUNTER — Ambulatory Visit (INDEPENDENT_AMBULATORY_CARE_PROVIDER_SITE_OTHER): Payer: Medicare Other | Admitting: Family Medicine

## 2015-12-04 VITALS — BP 132/72 | HR 62 | Wt 116.0 lb

## 2015-12-04 DIAGNOSIS — R102 Pelvic and perineal pain: Secondary | ICD-10-CM

## 2015-12-04 DIAGNOSIS — N949 Unspecified condition associated with female genital organs and menstrual cycle: Secondary | ICD-10-CM | POA: Diagnosis not present

## 2015-12-04 DIAGNOSIS — Z1239 Encounter for other screening for malignant neoplasm of breast: Secondary | ICD-10-CM

## 2015-12-04 DIAGNOSIS — N952 Postmenopausal atrophic vaginitis: Secondary | ICD-10-CM

## 2015-12-04 LAB — WET PREP FOR TRICH, YEAST, CLUE
CLUE CELLS WET PREP: NONE SEEN
Trich, Wet Prep: NONE SEEN
Yeast Wet Prep HPF POC: NONE SEEN

## 2015-12-04 NOTE — Progress Notes (Signed)
CC: Claire Cox is a 82 y.o. female is here for vaginal exam and breast exam   Subjective: HPI:  Complains of vaginal pain with sexual intercourse. No longer causing any pain that was just sitting stationary. She denies any vaginal secretions or discharge. She denies any dysuria. She denies any known visual lesions in the vaginal area or surrounding region. She started Friday of different lubricants however nothing seems to be helping. She's also tried triamcinolone cream along with clotrimazole however this doesn't seem to be helping. There has been no fevers, chills, abdominal pain or gastrointestinal complaints.  She's been experiencing some distress over since Saturday when she found out that her daughter by marriage is experiencing seizures.   Review Of Systems Outlined In HPI  Past Medical History  Diagnosis Date  . Thyroid nodule 01/19/2012    [place] 08/2011, right lob 6. Cm and left lobe 7.0 cm, lobulated mass in left lobe measuring 3.8 cm.  [place] FNA on [id number] sowing follicular proliferation of undetermined sig.  Repaet FNA on 10/2011: benign.     . Osteoporosis 01/19/2012    DEXA 08/2011.  T score spine -4.3.  Reclast 08/2011. She had GI upset and irritation with Fosamax. She declines any future Reclast infusions. She also declines any future oral agents.   . Hypothyroid 01/19/2012    Diagnosed February 2013.     Past Surgical History  Procedure Laterality Date  . Cataract extraction w/ intraocular lens implant  09/2011    left eye, defective implant   No family history on file.  Social History   Social History  . Marital Status: Married    Spouse Name: N/A  . Number of Children: N/A  . Years of Education: N/A   Occupational History  . Not on file.   Social History Main Topics  . Smoking status: Former Research scientist (life sciences)  . Smokeless tobacco: Not on file  . Alcohol Use: Not on file  . Drug Use: Not on file  . Sexual Activity: Not on file   Other Topics Concern  . Not on file   Social  History Narrative     Objective: BP 132/72 mmHg  Pulse 62  Wt 116 lb (52.617 kg)  General: Alert and Oriented, No Acute Distress HEENT: Pupils equal, round, reactive to light. Conjunctivae clear.  Moist oral mucous membranes Lungs: Clear to auscultation bilaterally, no wheezing/ronchi/rales.  Comfortable work of breathing. Good air movement. Cardiac: Regular rate and rhythm. Normal S1/S2.  No murmurs, rubs, nor gallops.   Genitourinary: Unremarkable urethra, no vaginal lesions, vaginal mucosa is severely dry however no adhesions. Extremities: No peripheral edema.  Strong peripheral pulses.  Mental Status: No depression, anxiety, nor agitation. Requires frequent redirection to her chief complaints today. Skin: Warm and dry.  Assessment & Plan: Claire Cox was seen today for vaginal exam and breast exam.  Diagnoses and all orders for this visit:  Vaginal atrophy  Vaginal pain -     WET PREP FOR Claire Cox, YEAST, CLUE  Screening for breast cancer -     MM DIGITAL SCREENING BILATERAL; Future   Vaginal atrophy resulting in pain with sex. She is already tried estrogens in the past which caused intolerable side effects, additionally she is not a candidate due to cancer history. Rule out infection with wet prep today, we'll have to look into alternative lubricants if wet prep is normal. She's having some breast concerns, unfortunately didn't have enough time today to completely evaluate this due to frequent redirection back to her  chief complaint, I have encouraged her to follow up on an as-needed basis for this but in the meantime I've ordered a mammogram  40 minutes spent face-to-face during visit today of which at least 50% was counseling or coordinating care regarding: 1. Vaginal atrophy   2. Vaginal pain   3. Screening for breast cancer      Return if symptoms worsen or fail to improve.

## 2015-12-04 NOTE — Patient Instructions (Signed)
Somebody from our radiology department should call you within the next week about scheduling a mammogram. Please contact me if you have not been contacted by the end of the week.  I should have results about your vaginal secretion test by Thursday.

## 2015-12-05 ENCOUNTER — Telehealth: Payer: Self-pay

## 2015-12-05 DIAGNOSIS — N644 Mastodynia: Secondary | ICD-10-CM

## 2015-12-05 MED ORDER — LIDOCAINE 5 % EX OINT: TOPICAL_OINTMENT | CUTANEOUS | Status: AC

## 2015-12-05 NOTE — Telephone Encounter (Signed)
Pt.notified

## 2015-12-05 NOTE — Telephone Encounter (Signed)
Request will be placed, I'm not sure if they do this at our location or not, if not I'll find the closest option. Will you also let her know that her vaginal swab did not show any signs of infection.  I'd recommend she try a topical form of lidocaine that will serve as a lubricant and pain reliever that i've sent to [facility].

## 2015-12-05 NOTE — Telephone Encounter (Signed)
Pt is asking for an ultrasound of the breast instead of a mammogram. She would like this to be done at [facility]. Please advise.

## 2015-12-10 DIAGNOSIS — R41841 Cognitive communication deficit: Secondary | ICD-10-CM | POA: Diagnosis not present

## 2015-12-11 DIAGNOSIS — Z9181 History of falling: Secondary | ICD-10-CM | POA: Diagnosis not present

## 2015-12-11 DIAGNOSIS — R1013 Epigastric pain: Secondary | ICD-10-CM | POA: Diagnosis not present

## 2015-12-11 DIAGNOSIS — Z821 Family history of blindness and visual loss: Secondary | ICD-10-CM | POA: Diagnosis not present

## 2015-12-11 DIAGNOSIS — Z823 Family history of stroke: Secondary | ICD-10-CM | POA: Diagnosis not present

## 2015-12-11 DIAGNOSIS — Z792 Long term (current) use of antibiotics: Secondary | ICD-10-CM | POA: Diagnosis not present

## 2015-12-11 DIAGNOSIS — K219 Gastro-esophageal reflux disease without esophagitis: Secondary | ICD-10-CM | POA: Diagnosis not present

## 2015-12-11 DIAGNOSIS — R110 Nausea: Secondary | ICD-10-CM | POA: Diagnosis not present

## 2015-12-11 DIAGNOSIS — Z881 Allergy status to other antibiotic agents status: Secondary | ICD-10-CM | POA: Diagnosis not present

## 2015-12-11 DIAGNOSIS — R41841 Cognitive communication deficit: Secondary | ICD-10-CM | POA: Diagnosis not present

## 2015-12-11 DIAGNOSIS — K449 Diaphragmatic hernia without obstruction or gangrene: Secondary | ICD-10-CM | POA: Diagnosis not present

## 2015-12-11 DIAGNOSIS — Z87891 Personal history of nicotine dependence: Secondary | ICD-10-CM | POA: Diagnosis not present

## 2015-12-11 DIAGNOSIS — Z809 Family history of malignant neoplasm, unspecified: Secondary | ICD-10-CM | POA: Diagnosis not present

## 2015-12-11 DIAGNOSIS — C719 Malignant neoplasm of brain, unspecified: Secondary | ICD-10-CM | POA: Diagnosis not present

## 2015-12-11 DIAGNOSIS — Z83518 Family history of other specified eye disorder: Secondary | ICD-10-CM | POA: Diagnosis not present

## 2015-12-11 DIAGNOSIS — Z8249 Family history of ischemic heart disease and other diseases of the circulatory system: Secondary | ICD-10-CM | POA: Diagnosis not present

## 2015-12-11 DIAGNOSIS — Z79899 Other long term (current) drug therapy: Secondary | ICD-10-CM | POA: Diagnosis not present

## 2015-12-11 DIAGNOSIS — Z888 Allergy status to other drugs, medicaments and biological substances status: Secondary | ICD-10-CM | POA: Diagnosis not present

## 2015-12-11 DIAGNOSIS — K224 Dyskinesia of esophagus: Secondary | ICD-10-CM | POA: Diagnosis not present

## 2015-12-12 ENCOUNTER — Other Ambulatory Visit: Payer: Self-pay | Admitting: Family Medicine

## 2015-12-12 DIAGNOSIS — K2950 Unspecified chronic gastritis without bleeding: Secondary | ICD-10-CM

## 2015-12-19 ENCOUNTER — Other Ambulatory Visit: Payer: Self-pay | Admitting: Family Medicine

## 2015-12-19 ENCOUNTER — Ambulatory Visit: Payer: Medicare Other

## 2015-12-19 DIAGNOSIS — Z1239 Encounter for other screening for malignant neoplasm of breast: Secondary | ICD-10-CM

## 2015-12-19 DIAGNOSIS — J029 Acute pharyngitis, unspecified: Secondary | ICD-10-CM | POA: Diagnosis not present

## 2015-12-19 DIAGNOSIS — R070 Pain in throat: Secondary | ICD-10-CM | POA: Diagnosis not present

## 2015-12-21 DIAGNOSIS — D61810 Antineoplastic chemotherapy induced pancytopenia: Secondary | ICD-10-CM | POA: Diagnosis not present

## 2015-12-21 DIAGNOSIS — Z87891 Personal history of nicotine dependence: Secondary | ICD-10-CM | POA: Diagnosis not present

## 2015-12-21 DIAGNOSIS — C719 Malignant neoplasm of brain, unspecified: Secondary | ICD-10-CM | POA: Diagnosis not present

## 2015-12-22 ENCOUNTER — Other Ambulatory Visit: Payer: Self-pay | Admitting: Family Medicine

## 2015-12-24 ENCOUNTER — Ambulatory Visit (HOSPITAL_BASED_OUTPATIENT_CLINIC_OR_DEPARTMENT_OTHER)
Admission: RE | Admit: 2015-12-24 | Discharge: 2015-12-24 | Disposition: A | Discharge status: Home / Self Care | Payer: Medicare Other | Source: Ambulatory Visit | Attending: Family Medicine | Admitting: Family Medicine

## 2015-12-24 DIAGNOSIS — Z1231 Encounter for screening mammogram for malignant neoplasm of breast: Secondary | ICD-10-CM | POA: Insufficient documentation

## 2015-12-24 DIAGNOSIS — Z1239 Encounter for other screening for malignant neoplasm of breast: Secondary | ICD-10-CM

## 2015-12-28 ENCOUNTER — Ambulatory Visit (INDEPENDENT_AMBULATORY_CARE_PROVIDER_SITE_OTHER): Payer: Medicare Other | Admitting: Osteopathic Medicine

## 2015-12-28 ENCOUNTER — Telehealth: Payer: Self-pay | Admitting: Family Medicine

## 2015-12-28 ENCOUNTER — Encounter: Payer: Self-pay | Admitting: Osteopathic Medicine

## 2015-12-28 VITALS — BP 137/70 | HR 77 | Wt 117.0 lb

## 2015-12-28 DIAGNOSIS — R300 Dysuria: Secondary | ICD-10-CM | POA: Diagnosis not present

## 2015-12-28 LAB — POCT URINALYSIS DIPSTICK
Bilirubin, UA: NEGATIVE
Blood, UA: NEGATIVE
Glucose, UA: NEGATIVE
Ketones, UA: NEGATIVE
Leukocytes, UA: NEGATIVE
Nitrite, UA: NEGATIVE
Protein, UA: NEGATIVE
Spec Grav, UA: 1.015
Urobilinogen, UA: 0.2
pH, UA: 6

## 2015-12-28 MED ORDER — CIPROFLOXACIN HCL 500 MG PO TABS: 500.0000 mg | ORAL_TABLET | Freq: Two times a day (BID) | ORAL | Status: AC

## 2015-12-28 NOTE — Telephone Encounter (Signed)
Pt notified.  She will be seeing Sheppard Coil this morning

## 2015-12-28 NOTE — Patient Instructions (Signed)
FILL THE ANTIBIOTICS IF YOUR PAIN WITH URINATION BECOMES WORSE, IF YOU EXPERIENCE SIGNIFICANT INCREASED URINE FREQUENCY. IF YOU HAVE FEVER OR LOWER ABDOMINAL PAIN, PLEASE SEEK MEDICAL CARE.

## 2015-12-28 NOTE — Telephone Encounter (Signed)
Will you please let patient know that I'm on vacation right now and am not able to prescribe anything for her uti concerns. If she's still experiencing some discomfort can you ask if she would be able to come in and see one of my partners this morning? I won't be back until Thursday

## 2015-12-28 NOTE — Progress Notes (Signed)
Chief Complaint: Possible UTI  History of Present Illness: Claire Cox is a 82 y.o. female who presents to [facility]  today with concerns for UTI  Onset: last night Quality: Burning/Urgency x 1 urination, normal now Associated Symptoms: see ROS below. PCP started lidocaine prn prior to sex, concern for vaginal atrophy, pt states she had been on topical estrogen therapy in the past but this caused itching Context:  Previous UTI: seems to have symptoms every few months but cultures negative  Recurrent UTI (3 times/more annually): none verified by negative/inadequate cultures over past year, see below  Records reviewed: UCx - 08/2015 two cultures no growth, 05/2015 25K multiple monotypes, 04/2015 no growth, 02/2015 inadequate growth, 01/2015 no growth, 10/2014 no growth  Patient becomes tearful when talking about her cancer diagnosis. SHe and her husband are seeking cancer treatment in [place], leaving today, she is worried a UTI will prohibit her acceptance into this program. They need to leave soon to go to the airport in [place].    Past medical, social and family history reviewed: Past Medical History  Diagnosis Date  . Thyroid nodule 01/19/2012    [place] 08/2011, right lob 6. Cm and left lobe 7.0 cm, lobulated mass in left lobe measuring 3.8 cm.  [place] FNA on [id number] sowing follicular proliferation of undetermined sig.  Repaet FNA on 10/2011: benign.     . Osteoporosis 01/19/2012    DEXA 08/2011.  T score spine -4.3.  Reclast 08/2011. She had GI upset and irritation with Fosamax. She declines any future Reclast infusions. She also declines any future oral agents.   . Hypothyroid 01/19/2012    Diagnosed February 2013.    Past Surgical History  Procedure Laterality Date  . Cataract extraction w/ intraocular lens implant  09/2011    left eye, defective implant   Social History  Substance Use Topics  . Smoking status: Former Research scientist (life sciences)  . Smokeless tobacco: Not on file   . Alcohol Use: Not on file   The patient has a family history of  Current Outpatient Prescriptions  Medication Sig Dispense Refill  . AMBULATORY NON FORMULARY MEDICATION Tri-Iodine 25mg  daily 1 capsule 0  . AMBULATORY NON FORMULARY MEDICATION Maalox  100 ml  Donnatal 50 ml  1 tablespoon TID as needed for abdominal or esophageal discomfort 250 mL 11  . clonazePAM (KLONOPIN) 0.5 MG tablet Take 0.5 tablets (0.25 mg total) by mouth at bedtime. 30 tablet 2  . DEXILANT 60 MG capsule TAKE ONE CAPSULE BY MOUTH ONCE DAILY 30 capsule 0  . dicyclomine (BENTYL) 10 MG capsule One by mouth as needed with meals and at bedtime to prevent bowel spasm/cramping. 50 capsule 0  . levETIRAcetam (KEPPRA) 750 MG tablet Take 1 tablet (750 mg total) by mouth 2 (two) times daily. 180 tablet 1  . lidocaine (XYLOCAINE) 5 % ointment Apply to vagina as needed 10-20 minutes before sex. 35.44 g 11  . Magnesium 400 MG CAPS Daily to prevent headaches.    . montelukast (SINGULAIR) 10 MG tablet Take 1 tablet (10 mg total) by mouth at bedtime. 30 tablet 0  . nortriptyline (PAMELOR) 25 MG capsule Take 1 capsule (25 mg total) by mouth at bedtime.    . ondansetron (ZOFRAN) 8 MG tablet Take by mouth every 8 (eight) hours as needed for nausea or vomiting. 20 tablet 0  . triamcinolone ointment (KENALOG) 0.1 % Apply 1 application topically 2 (two) times daily. 30 g 3   No current facility-administered  medications for this visit.   Allergies  Allergen Reactions  . Diazepam   . Doxycycline     Abdominal pain  . Erythromycin Hives    Cannot take any drugs in the "mycin" family  . Fosamax [Alendronate Sodium] Other (See Comments)    Reflux, GI upset.   . Minocycline     Other reaction(s): Other (See Comments) Esophageal and stomach pain. "deadly," as pt described   . Omeprazole     Worsened gastritis     Review of Systems: CONSTITUTIONAL: Negative fever/chills CARDIAC: No chest pain/pressure/palpitations, no  orthopnea RESPIRATORY: No cough/shortness of breath/wheeze GASTROINTESTINAL: No nausea/vomiting/abdominal pain/blood in stool/diarrhea/constipation MUSCULOSKELETAL: see below re: flank pain GENITOURINARY:    Frequency: no  Hematuria: no  Odor: no  Incontinence: no  Flank Pain: no  Vaginal bleeding/discharge: no   Exam:  BP 137/70 mmHg  Pulse 77  Wt 117 lb (53.071 kg) Constitutional: VSS, see above. General Appearance: alert, well-developed, well-nourished, NAD Respiratory: Normal respiratory effort. Breath sounds normal, no wheeze/rhonchi/rales Cardiovascular: S1/S2 normal, no murmur/rub/gallop auscultated. RRR MSK: Gait normal. No clubbing/cyanosis of digits. Lloyd sign Negative bilateral Psych: tearful when talks about cancer, normal judgment/insight   Results for orders placed or performed in visit on 12/28/15 (from the past 24 hour(s))  POCT Urinalysis Dipstick     Status: None   Collection Time: 12/28/15 11:14 AM  Result Value Ref Range   Color, UA yellow    Clarity, UA clear    Glucose, UA negative    Bilirubin, UA negative    Ketones, UA negative    Spec Grav, UA 1.015    Blood, UA negative    pH, UA 6.0    Protein, UA negative    Urobilinogen, UA 0.2    Nitrite, UA negative    Leukocytes, UA Negative Negative    Previous Culture Results: see above   ASSESSMENT/PLAN: Normal UA and her symptoms have resolved. Pt is in a rush to get to the airport at the moment, declines vaginal/pelvic exam, I suspect vaginal atrophy and urethral irritation caused her dysuria x 1 last night and with resolution of symptoms now and normal UA and neg/indeterminate UCx for past year, I don't think prudent to start abx at this time, Rx printed for patient to fill if symptoms come back or get worse while she is out of town, will call with culture results when available.   Dysuria - Suspect vaginal/urethral problem and would recommend vaginal/pelvic exam when patient has time - declined  today, hx intolerance to vaginal estrogen - Plan: POCT Urinalysis Dipstick, Urine Culture, Urine Microscopic  Patient advised we will call with urine culture results once available, depending on results may need to change therapy. Return if symptoms worsen or fail to improve, and as directed by PCP for routine care.

## 2015-12-29 LAB — URINALYSIS, MICROSCOPIC ONLY
BACTERIA UA: NONE SEEN [HPF]
CASTS: NONE SEEN [LPF]
CRYSTALS: NONE SEEN [HPF]
RBC / HPF: NONE SEEN RBC/HPF (ref <=2)
SQUAMOUS EPITHELIAL / LPF: NONE SEEN [HPF] (ref <=5)
WBC, UA: NONE SEEN WBC/HPF (ref <=5)
YEAST: NONE SEEN [HPF]

## 2015-12-30 LAB — URINE CULTURE
COLONY COUNT: NO GROWTH
ORGANISM ID, BACTERIA: NO GROWTH

## 2015-12-31 ENCOUNTER — Telehealth: Payer: Self-pay

## 2015-12-31 ENCOUNTER — Other Ambulatory Visit: Payer: Self-pay

## 2015-12-31 MED ORDER — AMBULATORY NON FORMULARY MEDICATION: Status: AC

## 2015-12-31 MED ORDER — DEXLANSOPRAZOLE 60 MG PO CPDR: 1.0000 | DELAYED_RELEASE_CAPSULE | Freq: Every day | ORAL | Status: AC

## 2015-12-31 NOTE — Progress Notes (Signed)
Emergency supply while out of state.

## 2015-12-31 NOTE — Telephone Encounter (Signed)
Keep the ratio the same. Dispense around 251ml.

## 2016-01-03 DIAGNOSIS — H1131 Conjunctival hemorrhage, right eye: Secondary | ICD-10-CM | POA: Diagnosis not present

## 2016-01-10 DIAGNOSIS — F4323 Adjustment disorder with mixed anxiety and depressed mood: Secondary | ICD-10-CM | POA: Diagnosis not present

## 2016-01-11 DIAGNOSIS — C719 Malignant neoplasm of brain, unspecified: Secondary | ICD-10-CM | POA: Diagnosis not present

## 2016-01-11 DIAGNOSIS — K589 Irritable bowel syndrome without diarrhea: Secondary | ICD-10-CM | POA: Diagnosis not present

## 2016-01-11 DIAGNOSIS — R413 Other amnesia: Secondary | ICD-10-CM | POA: Diagnosis not present

## 2016-01-11 DIAGNOSIS — Z87891 Personal history of nicotine dependence: Secondary | ICD-10-CM | POA: Diagnosis not present

## 2016-01-16 DIAGNOSIS — H04123 Dry eye syndrome of bilateral lacrimal glands: Secondary | ICD-10-CM | POA: Diagnosis not present

## 2016-01-24 DIAGNOSIS — C712 Malignant neoplasm of temporal lobe: Secondary | ICD-10-CM | POA: Diagnosis not present

## 2016-01-24 DIAGNOSIS — Z923 Personal history of irradiation: Secondary | ICD-10-CM | POA: Diagnosis not present

## 2016-01-24 DIAGNOSIS — C719 Malignant neoplasm of brain, unspecified: Secondary | ICD-10-CM | POA: Diagnosis not present

## 2016-01-24 DIAGNOSIS — R410 Disorientation, unspecified: Secondary | ICD-10-CM | POA: Diagnosis not present

## 2016-01-24 DIAGNOSIS — F419 Anxiety disorder, unspecified: Secondary | ICD-10-CM | POA: Diagnosis not present

## 2016-01-24 DIAGNOSIS — Z79899 Other long term (current) drug therapy: Secondary | ICD-10-CM | POA: Diagnosis not present

## 2016-01-24 DIAGNOSIS — G40909 Epilepsy, unspecified, not intractable, without status epilepticus: Secondary | ICD-10-CM | POA: Diagnosis not present

## 2016-01-27 DIAGNOSIS — M25561 Pain in right knee: Secondary | ICD-10-CM | POA: Diagnosis not present

## 2016-01-27 DIAGNOSIS — T148 Other injury of unspecified body region: Secondary | ICD-10-CM | POA: Diagnosis not present

## 2016-01-30 DIAGNOSIS — M9903 Segmental and somatic dysfunction of lumbar region: Secondary | ICD-10-CM | POA: Diagnosis not present

## 2016-01-30 DIAGNOSIS — M9905 Segmental and somatic dysfunction of pelvic region: Secondary | ICD-10-CM | POA: Diagnosis not present

## 2016-01-30 DIAGNOSIS — M47816 Spondylosis without myelopathy or radiculopathy, lumbar region: Secondary | ICD-10-CM | POA: Diagnosis not present

## 2016-01-30 DIAGNOSIS — M9901 Segmental and somatic dysfunction of cervical region: Secondary | ICD-10-CM | POA: Diagnosis not present

## 2016-01-31 DIAGNOSIS — M47816 Spondylosis without myelopathy or radiculopathy, lumbar region: Secondary | ICD-10-CM | POA: Diagnosis not present

## 2016-01-31 DIAGNOSIS — M9905 Segmental and somatic dysfunction of pelvic region: Secondary | ICD-10-CM | POA: Diagnosis not present

## 2016-01-31 DIAGNOSIS — M9903 Segmental and somatic dysfunction of lumbar region: Secondary | ICD-10-CM | POA: Diagnosis not present

## 2016-01-31 DIAGNOSIS — M9901 Segmental and somatic dysfunction of cervical region: Secondary | ICD-10-CM | POA: Diagnosis not present

## 2016-02-04 DIAGNOSIS — M9905 Segmental and somatic dysfunction of pelvic region: Secondary | ICD-10-CM | POA: Diagnosis not present

## 2016-02-04 DIAGNOSIS — M9903 Segmental and somatic dysfunction of lumbar region: Secondary | ICD-10-CM | POA: Diagnosis not present

## 2016-02-04 DIAGNOSIS — M9901 Segmental and somatic dysfunction of cervical region: Secondary | ICD-10-CM | POA: Diagnosis not present

## 2016-02-04 DIAGNOSIS — M47816 Spondylosis without myelopathy or radiculopathy, lumbar region: Secondary | ICD-10-CM | POA: Diagnosis not present

## 2016-02-05 DIAGNOSIS — S8001XA Contusion of right knee, initial encounter: Secondary | ICD-10-CM | POA: Diagnosis not present

## 2016-02-11 DIAGNOSIS — M9903 Segmental and somatic dysfunction of lumbar region: Secondary | ICD-10-CM | POA: Diagnosis not present

## 2016-02-11 DIAGNOSIS — M47816 Spondylosis without myelopathy or radiculopathy, lumbar region: Secondary | ICD-10-CM | POA: Diagnosis not present

## 2016-02-11 DIAGNOSIS — M9901 Segmental and somatic dysfunction of cervical region: Secondary | ICD-10-CM | POA: Diagnosis not present

## 2016-02-11 DIAGNOSIS — M9905 Segmental and somatic dysfunction of pelvic region: Secondary | ICD-10-CM | POA: Diagnosis not present

## 2016-02-13 DIAGNOSIS — K449 Diaphragmatic hernia without obstruction or gangrene: Secondary | ICD-10-CM | POA: Diagnosis not present

## 2016-02-13 DIAGNOSIS — N941 Unspecified dyspareunia: Secondary | ICD-10-CM | POA: Diagnosis not present

## 2016-02-13 DIAGNOSIS — R1084 Generalized abdominal pain: Secondary | ICD-10-CM | POA: Diagnosis not present

## 2016-02-13 DIAGNOSIS — C719 Malignant neoplasm of brain, unspecified: Secondary | ICD-10-CM | POA: Diagnosis not present

## 2016-02-18 DIAGNOSIS — R300 Dysuria: Secondary | ICD-10-CM | POA: Diagnosis not present

## 2016-02-18 DIAGNOSIS — M9901 Segmental and somatic dysfunction of cervical region: Secondary | ICD-10-CM | POA: Diagnosis not present

## 2016-02-18 DIAGNOSIS — M47816 Spondylosis without myelopathy or radiculopathy, lumbar region: Secondary | ICD-10-CM | POA: Diagnosis not present

## 2016-02-18 DIAGNOSIS — M9903 Segmental and somatic dysfunction of lumbar region: Secondary | ICD-10-CM | POA: Diagnosis not present

## 2016-02-18 DIAGNOSIS — C712 Malignant neoplasm of temporal lobe: Secondary | ICD-10-CM | POA: Diagnosis not present

## 2016-02-18 DIAGNOSIS — M9905 Segmental and somatic dysfunction of pelvic region: Secondary | ICD-10-CM | POA: Diagnosis not present

## 2016-02-21 DIAGNOSIS — M9905 Segmental and somatic dysfunction of pelvic region: Secondary | ICD-10-CM | POA: Diagnosis not present

## 2016-02-21 DIAGNOSIS — M9901 Segmental and somatic dysfunction of cervical region: Secondary | ICD-10-CM | POA: Diagnosis not present

## 2016-02-21 DIAGNOSIS — M9903 Segmental and somatic dysfunction of lumbar region: Secondary | ICD-10-CM | POA: Diagnosis not present

## 2016-02-21 DIAGNOSIS — M47816 Spondylosis without myelopathy or radiculopathy, lumbar region: Secondary | ICD-10-CM | POA: Diagnosis not present

## 2016-02-23 DIAGNOSIS — Z85841 Personal history of malignant neoplasm of brain: Secondary | ICD-10-CM | POA: Diagnosis not present

## 2016-02-23 DIAGNOSIS — Z08 Encounter for follow-up examination after completed treatment for malignant neoplasm: Secondary | ICD-10-CM | POA: Diagnosis not present

## 2016-02-23 DIAGNOSIS — C712 Malignant neoplasm of temporal lobe: Secondary | ICD-10-CM | POA: Diagnosis not present

## 2016-02-25 DIAGNOSIS — C719 Malignant neoplasm of brain, unspecified: Secondary | ICD-10-CM | POA: Diagnosis not present

## 2016-02-25 DIAGNOSIS — E039 Hypothyroidism, unspecified: Secondary | ICD-10-CM | POA: Diagnosis not present

## 2016-02-25 DIAGNOSIS — G40909 Epilepsy, unspecified, not intractable, without status epilepticus: Secondary | ICD-10-CM | POA: Diagnosis not present

## 2016-02-25 DIAGNOSIS — K449 Diaphragmatic hernia without obstruction or gangrene: Secondary | ICD-10-CM | POA: Diagnosis not present

## 2016-02-25 DIAGNOSIS — C712 Malignant neoplasm of temporal lobe: Secondary | ICD-10-CM | POA: Diagnosis not present

## 2016-02-25 DIAGNOSIS — Z923 Personal history of irradiation: Secondary | ICD-10-CM | POA: Diagnosis not present

## 2016-02-25 DIAGNOSIS — R410 Disorientation, unspecified: Secondary | ICD-10-CM | POA: Diagnosis not present

## 2016-02-25 DIAGNOSIS — F419 Anxiety disorder, unspecified: Secondary | ICD-10-CM | POA: Diagnosis not present

## 2016-02-25 DIAGNOSIS — B1910 Unspecified viral hepatitis B without hepatic coma: Secondary | ICD-10-CM | POA: Diagnosis not present

## 2016-02-25 DIAGNOSIS — K219 Gastro-esophageal reflux disease without esophagitis: Secondary | ICD-10-CM | POA: Diagnosis not present

## 2016-02-25 DIAGNOSIS — Z79899 Other long term (current) drug therapy: Secondary | ICD-10-CM | POA: Diagnosis not present

## 2016-02-25 DIAGNOSIS — R569 Unspecified convulsions: Secondary | ICD-10-CM | POA: Diagnosis not present

## 2016-02-26 DIAGNOSIS — M9905 Segmental and somatic dysfunction of pelvic region: Secondary | ICD-10-CM | POA: Diagnosis not present

## 2016-02-26 DIAGNOSIS — M47816 Spondylosis without myelopathy or radiculopathy, lumbar region: Secondary | ICD-10-CM | POA: Diagnosis not present

## 2016-02-26 DIAGNOSIS — M9901 Segmental and somatic dysfunction of cervical region: Secondary | ICD-10-CM | POA: Diagnosis not present

## 2016-02-26 DIAGNOSIS — M9903 Segmental and somatic dysfunction of lumbar region: Secondary | ICD-10-CM | POA: Diagnosis not present

## 2016-02-27 DIAGNOSIS — R102 Pelvic and perineal pain: Secondary | ICD-10-CM | POA: Diagnosis not present

## 2016-02-27 DIAGNOSIS — N94810 Vulvar vestibulitis: Secondary | ICD-10-CM | POA: Diagnosis not present

## 2016-03-01 DIAGNOSIS — C719 Malignant neoplasm of brain, unspecified: Secondary | ICD-10-CM | POA: Diagnosis not present

## 2016-03-01 DIAGNOSIS — M25562 Pain in left knee: Secondary | ICD-10-CM | POA: Diagnosis not present

## 2016-03-03 DIAGNOSIS — M47816 Spondylosis without myelopathy or radiculopathy, lumbar region: Secondary | ICD-10-CM | POA: Diagnosis not present

## 2016-03-03 DIAGNOSIS — M9901 Segmental and somatic dysfunction of cervical region: Secondary | ICD-10-CM | POA: Diagnosis not present

## 2016-03-03 DIAGNOSIS — M9903 Segmental and somatic dysfunction of lumbar region: Secondary | ICD-10-CM | POA: Diagnosis not present

## 2016-03-03 DIAGNOSIS — M9905 Segmental and somatic dysfunction of pelvic region: Secondary | ICD-10-CM | POA: Diagnosis not present

## 2016-03-06 DIAGNOSIS — M9901 Segmental and somatic dysfunction of cervical region: Secondary | ICD-10-CM | POA: Diagnosis not present

## 2016-03-06 DIAGNOSIS — M9903 Segmental and somatic dysfunction of lumbar region: Secondary | ICD-10-CM | POA: Diagnosis not present

## 2016-03-06 DIAGNOSIS — M47816 Spondylosis without myelopathy or radiculopathy, lumbar region: Secondary | ICD-10-CM | POA: Diagnosis not present

## 2016-03-06 DIAGNOSIS — S8001XD Contusion of right knee, subsequent encounter: Secondary | ICD-10-CM | POA: Diagnosis not present

## 2016-03-06 DIAGNOSIS — M9905 Segmental and somatic dysfunction of pelvic region: Secondary | ICD-10-CM | POA: Diagnosis not present

## 2016-03-10 DIAGNOSIS — K449 Diaphragmatic hernia without obstruction or gangrene: Secondary | ICD-10-CM | POA: Diagnosis not present

## 2016-03-10 DIAGNOSIS — C719 Malignant neoplasm of brain, unspecified: Secondary | ICD-10-CM | POA: Diagnosis not present

## 2016-03-10 DIAGNOSIS — N941 Unspecified dyspareunia: Secondary | ICD-10-CM | POA: Diagnosis not present

## 2016-03-10 DIAGNOSIS — R1084 Generalized abdominal pain: Secondary | ICD-10-CM | POA: Diagnosis not present

## 2016-03-11 DIAGNOSIS — M47816 Spondylosis without myelopathy or radiculopathy, lumbar region: Secondary | ICD-10-CM | POA: Diagnosis not present

## 2016-03-11 DIAGNOSIS — M9905 Segmental and somatic dysfunction of pelvic region: Secondary | ICD-10-CM | POA: Diagnosis not present

## 2016-03-11 DIAGNOSIS — M9901 Segmental and somatic dysfunction of cervical region: Secondary | ICD-10-CM | POA: Diagnosis not present

## 2016-03-11 DIAGNOSIS — M9903 Segmental and somatic dysfunction of lumbar region: Secondary | ICD-10-CM | POA: Diagnosis not present

## 2016-03-12 IMAGING — CR DG CHEST 2V
2 series · 2 of 2 positions shown · non-contrast
Comparison: None.

CLINICAL DATA: Productive cough for 3 weeks.

EXAM:
CHEST  2 VIEW

[chest pa]
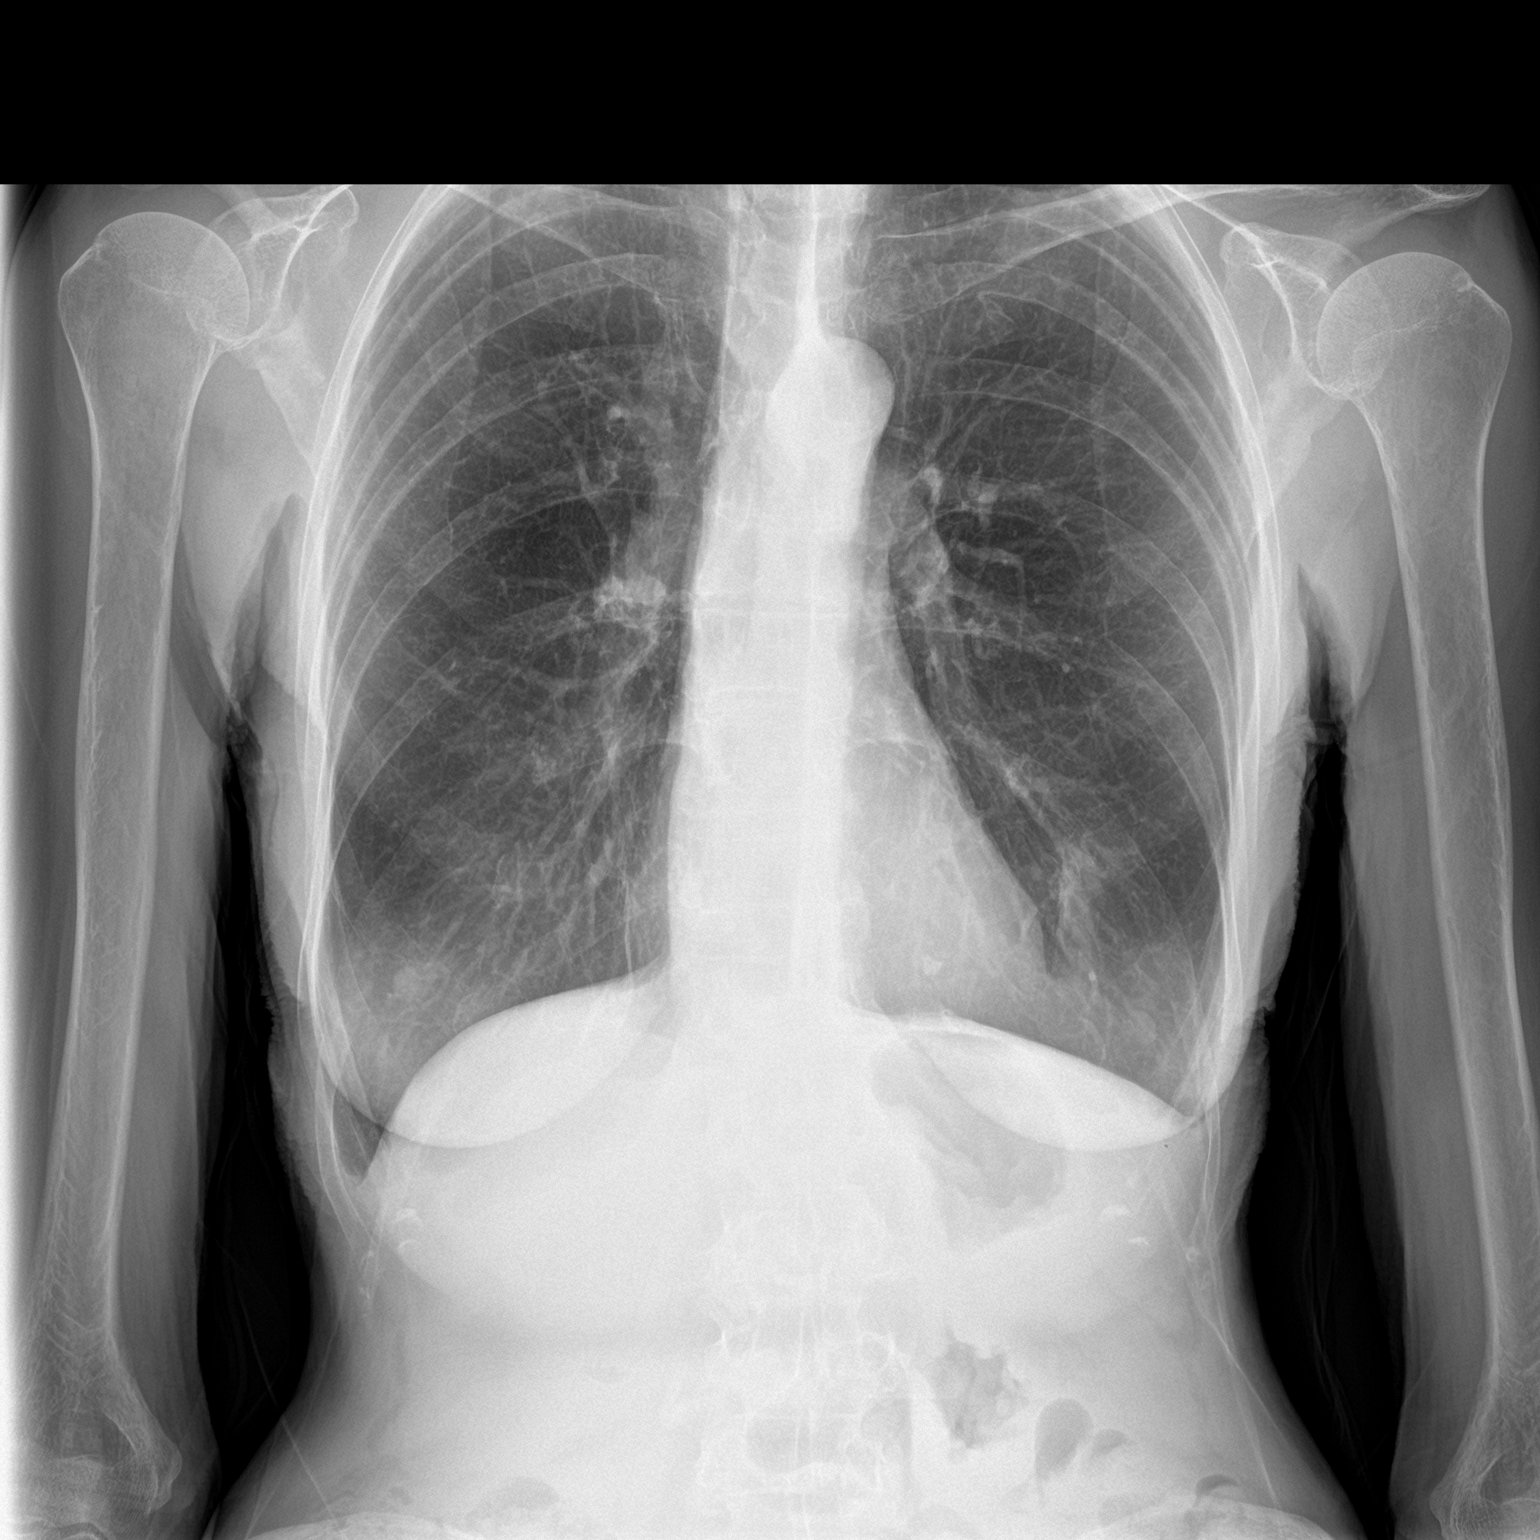

[chest lat]
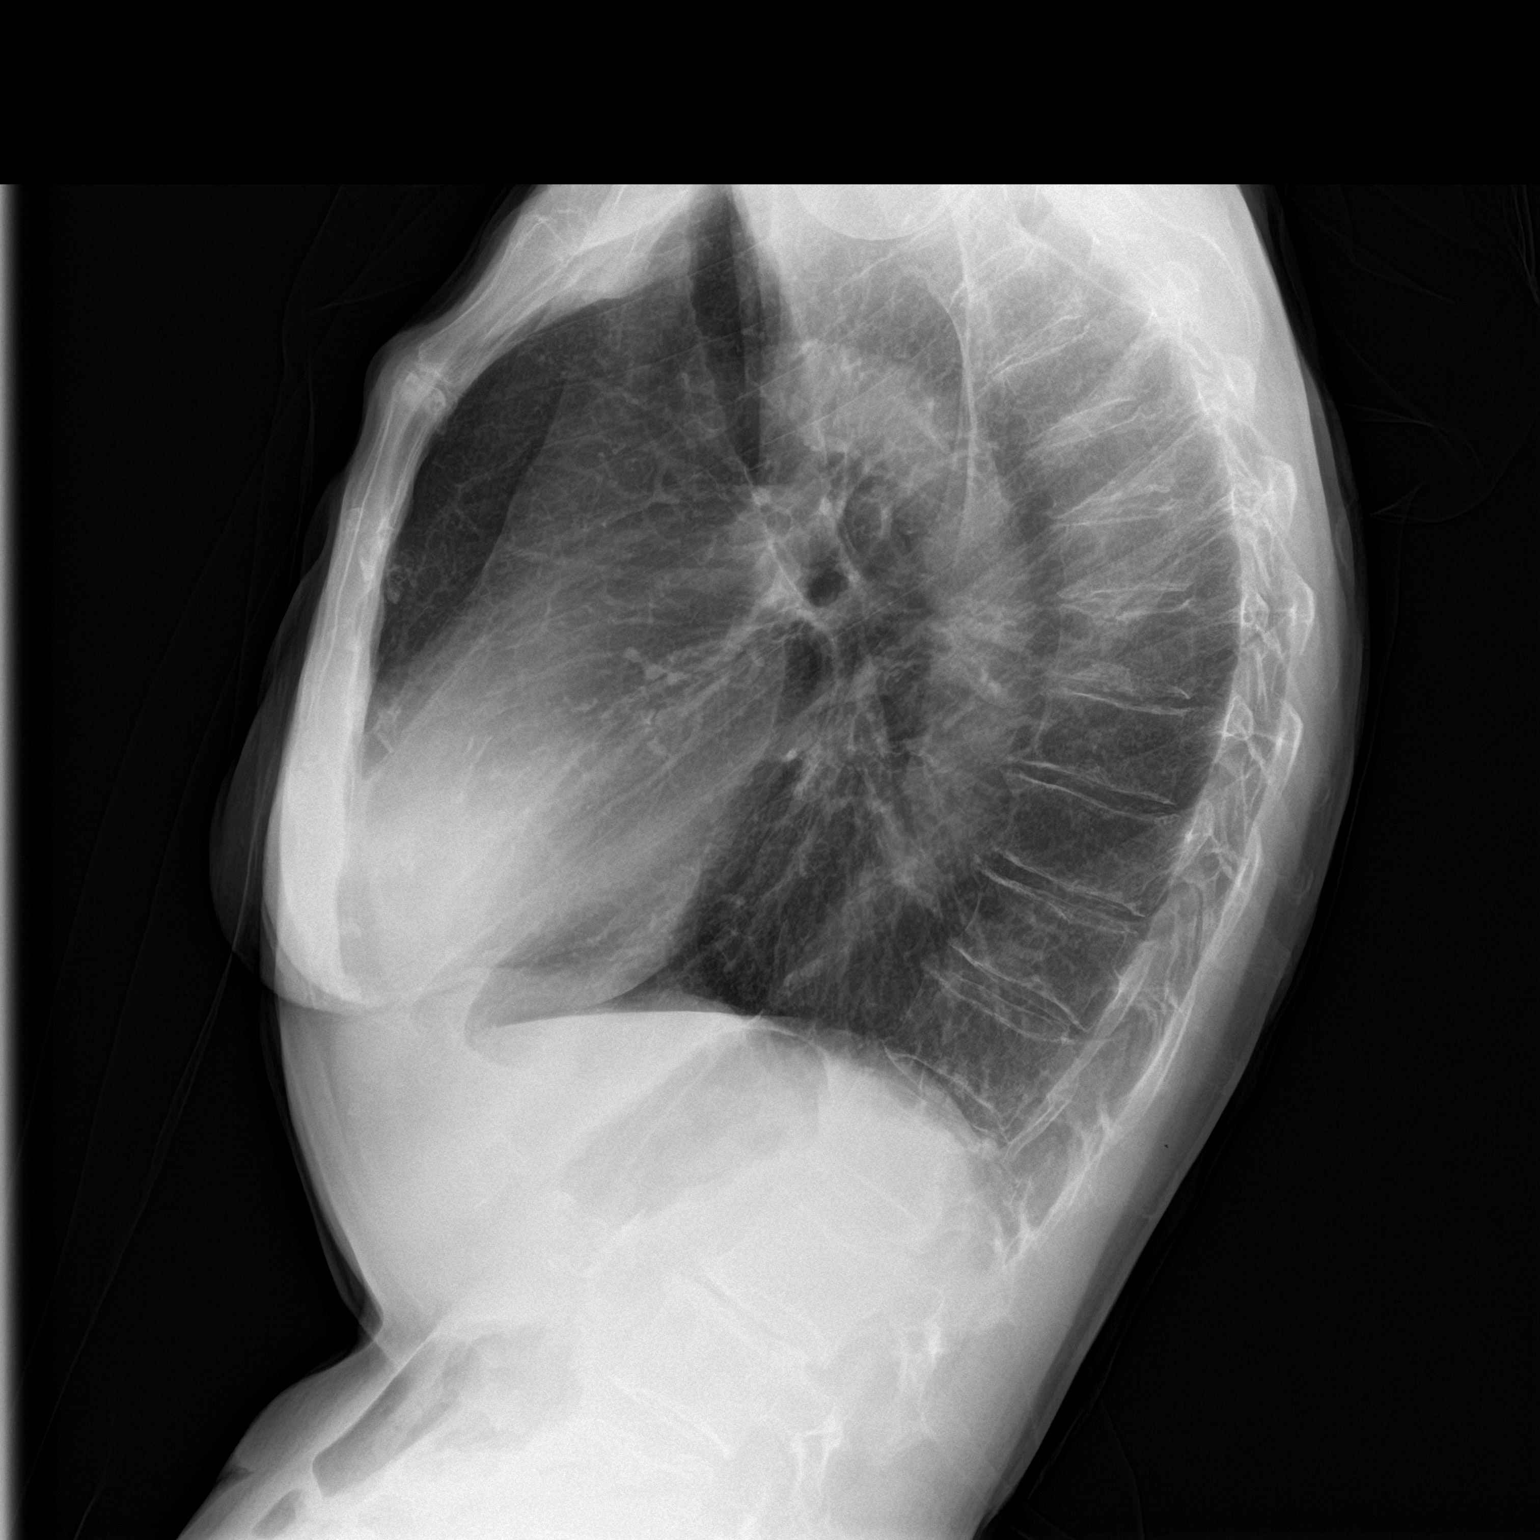

[2 of 2 positions shown; findings below may reference images not displayed]

FINDINGS: Normal heart size. Normal mediastinal contour. No pneumothorax. No
pleural effusion. Mildly hyperinflated lungs. No acute consolidative
airspace disease. No pulmonary edema.
IMPRESSION: 1. No acute consolidative airspace disease.
2. Hyperinflated lungs, suggesting COPD.

## 2016-03-13 DIAGNOSIS — M47816 Spondylosis without myelopathy or radiculopathy, lumbar region: Secondary | ICD-10-CM | POA: Diagnosis not present

## 2016-03-13 DIAGNOSIS — M9905 Segmental and somatic dysfunction of pelvic region: Secondary | ICD-10-CM | POA: Diagnosis not present

## 2016-03-13 DIAGNOSIS — S8001XD Contusion of right knee, subsequent encounter: Secondary | ICD-10-CM | POA: Diagnosis not present

## 2016-03-13 DIAGNOSIS — M9903 Segmental and somatic dysfunction of lumbar region: Secondary | ICD-10-CM | POA: Diagnosis not present

## 2016-03-13 DIAGNOSIS — M9901 Segmental and somatic dysfunction of cervical region: Secondary | ICD-10-CM | POA: Diagnosis not present

## 2016-03-17 DIAGNOSIS — M9901 Segmental and somatic dysfunction of cervical region: Secondary | ICD-10-CM | POA: Diagnosis not present

## 2016-03-17 DIAGNOSIS — M9903 Segmental and somatic dysfunction of lumbar region: Secondary | ICD-10-CM | POA: Diagnosis not present

## 2016-03-17 DIAGNOSIS — M9905 Segmental and somatic dysfunction of pelvic region: Secondary | ICD-10-CM | POA: Diagnosis not present

## 2016-03-17 DIAGNOSIS — M47816 Spondylosis without myelopathy or radiculopathy, lumbar region: Secondary | ICD-10-CM | POA: Diagnosis not present

## 2016-03-20 DIAGNOSIS — M9905 Segmental and somatic dysfunction of pelvic region: Secondary | ICD-10-CM | POA: Diagnosis not present

## 2016-03-20 DIAGNOSIS — M9901 Segmental and somatic dysfunction of cervical region: Secondary | ICD-10-CM | POA: Diagnosis not present

## 2016-03-20 DIAGNOSIS — M47816 Spondylosis without myelopathy or radiculopathy, lumbar region: Secondary | ICD-10-CM | POA: Diagnosis not present

## 2016-03-20 DIAGNOSIS — M9903 Segmental and somatic dysfunction of lumbar region: Secondary | ICD-10-CM | POA: Diagnosis not present

## 2016-03-26 DIAGNOSIS — M25561 Pain in right knee: Secondary | ICD-10-CM | POA: Diagnosis not present

## 2016-03-26 DIAGNOSIS — M1711 Unilateral primary osteoarthritis, right knee: Secondary | ICD-10-CM | POA: Diagnosis not present

## 2016-03-26 DIAGNOSIS — G8929 Other chronic pain: Secondary | ICD-10-CM | POA: Diagnosis not present

## 2016-03-27 DIAGNOSIS — M9901 Segmental and somatic dysfunction of cervical region: Secondary | ICD-10-CM | POA: Diagnosis not present

## 2016-03-27 DIAGNOSIS — M47816 Spondylosis without myelopathy or radiculopathy, lumbar region: Secondary | ICD-10-CM | POA: Diagnosis not present

## 2016-03-27 DIAGNOSIS — M9905 Segmental and somatic dysfunction of pelvic region: Secondary | ICD-10-CM | POA: Diagnosis not present

## 2016-03-27 DIAGNOSIS — M9903 Segmental and somatic dysfunction of lumbar region: Secondary | ICD-10-CM | POA: Diagnosis not present

## 2016-04-01 DIAGNOSIS — M47816 Spondylosis without myelopathy or radiculopathy, lumbar region: Secondary | ICD-10-CM | POA: Diagnosis not present

## 2016-04-01 DIAGNOSIS — M9903 Segmental and somatic dysfunction of lumbar region: Secondary | ICD-10-CM | POA: Diagnosis not present

## 2016-04-01 DIAGNOSIS — M9901 Segmental and somatic dysfunction of cervical region: Secondary | ICD-10-CM | POA: Diagnosis not present

## 2016-04-01 DIAGNOSIS — M9905 Segmental and somatic dysfunction of pelvic region: Secondary | ICD-10-CM | POA: Diagnosis not present

## 2016-04-02 DIAGNOSIS — R7301 Impaired fasting glucose: Secondary | ICD-10-CM | POA: Diagnosis not present

## 2016-04-02 DIAGNOSIS — C801 Malignant (primary) neoplasm, unspecified: Secondary | ICD-10-CM | POA: Diagnosis not present

## 2016-04-02 DIAGNOSIS — Z0000 Encounter for general adult medical examination without abnormal findings: Secondary | ICD-10-CM | POA: Diagnosis not present

## 2016-04-04 DIAGNOSIS — M9905 Segmental and somatic dysfunction of pelvic region: Secondary | ICD-10-CM | POA: Diagnosis not present

## 2016-04-04 DIAGNOSIS — M9901 Segmental and somatic dysfunction of cervical region: Secondary | ICD-10-CM | POA: Diagnosis not present

## 2016-04-04 DIAGNOSIS — M9903 Segmental and somatic dysfunction of lumbar region: Secondary | ICD-10-CM | POA: Diagnosis not present

## 2016-04-04 DIAGNOSIS — M47816 Spondylosis without myelopathy or radiculopathy, lumbar region: Secondary | ICD-10-CM | POA: Diagnosis not present

## 2016-04-07 DIAGNOSIS — R1084 Generalized abdominal pain: Secondary | ICD-10-CM | POA: Diagnosis not present

## 2016-04-07 DIAGNOSIS — R1013 Epigastric pain: Secondary | ICD-10-CM | POA: Diagnosis not present

## 2016-04-08 DIAGNOSIS — M1711 Unilateral primary osteoarthritis, right knee: Secondary | ICD-10-CM | POA: Diagnosis not present

## 2016-04-09 DIAGNOSIS — D1800 Hemangioma unspecified site: Secondary | ICD-10-CM | POA: Diagnosis not present

## 2016-04-09 DIAGNOSIS — L299 Pruritus, unspecified: Secondary | ICD-10-CM | POA: Diagnosis not present

## 2016-04-09 DIAGNOSIS — Z1283 Encounter for screening for malignant neoplasm of skin: Secondary | ICD-10-CM | POA: Diagnosis not present

## 2016-04-09 DIAGNOSIS — L219 Seborrheic dermatitis, unspecified: Secondary | ICD-10-CM | POA: Diagnosis not present

## 2016-04-10 DIAGNOSIS — K2900 Acute gastritis without bleeding: Secondary | ICD-10-CM | POA: Diagnosis not present

## 2016-04-10 DIAGNOSIS — Z85841 Personal history of malignant neoplasm of brain: Secondary | ICD-10-CM | POA: Diagnosis not present

## 2016-04-10 DIAGNOSIS — K2970 Gastritis, unspecified, without bleeding: Secondary | ICD-10-CM | POA: Diagnosis not present

## 2016-04-12 DIAGNOSIS — R001 Bradycardia, unspecified: Secondary | ICD-10-CM | POA: Diagnosis not present

## 2016-04-12 DIAGNOSIS — K2970 Gastritis, unspecified, without bleeding: Secondary | ICD-10-CM | POA: Diagnosis not present

## 2016-04-14 DIAGNOSIS — K219 Gastro-esophageal reflux disease without esophagitis: Secondary | ICD-10-CM | POA: Diagnosis not present

## 2016-04-14 DIAGNOSIS — J449 Chronic obstructive pulmonary disease, unspecified: Secondary | ICD-10-CM | POA: Diagnosis not present

## 2016-04-14 DIAGNOSIS — C719 Malignant neoplasm of brain, unspecified: Secondary | ICD-10-CM | POA: Diagnosis not present

## 2016-04-14 DIAGNOSIS — Z23 Encounter for immunization: Secondary | ICD-10-CM | POA: Diagnosis not present

## 2016-04-14 DIAGNOSIS — K449 Diaphragmatic hernia without obstruction or gangrene: Secondary | ICD-10-CM | POA: Diagnosis not present

## 2016-04-14 DIAGNOSIS — F419 Anxiety disorder, unspecified: Secondary | ICD-10-CM | POA: Diagnosis not present

## 2016-04-14 DIAGNOSIS — R1084 Generalized abdominal pain: Secondary | ICD-10-CM | POA: Diagnosis not present

## 2016-04-14 DIAGNOSIS — R030 Elevated blood-pressure reading, without diagnosis of hypertension: Secondary | ICD-10-CM | POA: Diagnosis not present

## 2016-04-14 DIAGNOSIS — G8929 Other chronic pain: Secondary | ICD-10-CM | POA: Diagnosis not present

## 2016-04-14 DIAGNOSIS — M720 Palmar fascial fibromatosis [Dupuytren]: Secondary | ICD-10-CM | POA: Diagnosis not present

## 2016-04-14 DIAGNOSIS — D61818 Other pancytopenia: Secondary | ICD-10-CM | POA: Diagnosis not present

## 2016-04-14 DIAGNOSIS — R0789 Other chest pain: Secondary | ICD-10-CM | POA: Diagnosis not present

## 2016-04-14 DIAGNOSIS — R079 Chest pain, unspecified: Secondary | ICD-10-CM | POA: Diagnosis not present

## 2016-04-14 DIAGNOSIS — D72819 Decreased white blood cell count, unspecified: Secondary | ICD-10-CM | POA: Diagnosis not present

## 2016-04-15 DIAGNOSIS — F419 Anxiety disorder, unspecified: Secondary | ICD-10-CM | POA: Diagnosis not present

## 2016-04-15 DIAGNOSIS — K219 Gastro-esophageal reflux disease without esophagitis: Secondary | ICD-10-CM | POA: Diagnosis not present

## 2016-04-17 DIAGNOSIS — K449 Diaphragmatic hernia without obstruction or gangrene: Secondary | ICD-10-CM | POA: Diagnosis not present

## 2016-04-17 DIAGNOSIS — M1711 Unilateral primary osteoarthritis, right knee: Secondary | ICD-10-CM | POA: Diagnosis not present

## 2016-04-17 DIAGNOSIS — R1084 Generalized abdominal pain: Secondary | ICD-10-CM | POA: Diagnosis not present

## 2016-04-17 DIAGNOSIS — C719 Malignant neoplasm of brain, unspecified: Secondary | ICD-10-CM | POA: Diagnosis not present

## 2016-04-22 DIAGNOSIS — M47816 Spondylosis without myelopathy or radiculopathy, lumbar region: Secondary | ICD-10-CM | POA: Diagnosis not present

## 2016-04-22 DIAGNOSIS — M9903 Segmental and somatic dysfunction of lumbar region: Secondary | ICD-10-CM | POA: Diagnosis not present

## 2016-04-22 DIAGNOSIS — M9905 Segmental and somatic dysfunction of pelvic region: Secondary | ICD-10-CM | POA: Diagnosis not present

## 2016-04-22 DIAGNOSIS — M9901 Segmental and somatic dysfunction of cervical region: Secondary | ICD-10-CM | POA: Diagnosis not present

## 2016-04-22 DIAGNOSIS — R1084 Generalized abdominal pain: Secondary | ICD-10-CM | POA: Diagnosis not present

## 2016-04-22 DIAGNOSIS — M1711 Unilateral primary osteoarthritis, right knee: Secondary | ICD-10-CM | POA: Diagnosis not present

## 2016-04-24 DIAGNOSIS — M9903 Segmental and somatic dysfunction of lumbar region: Secondary | ICD-10-CM | POA: Diagnosis not present

## 2016-04-24 DIAGNOSIS — M9901 Segmental and somatic dysfunction of cervical region: Secondary | ICD-10-CM | POA: Diagnosis not present

## 2016-04-24 DIAGNOSIS — M47816 Spondylosis without myelopathy or radiculopathy, lumbar region: Secondary | ICD-10-CM | POA: Diagnosis not present

## 2016-04-24 DIAGNOSIS — M9905 Segmental and somatic dysfunction of pelvic region: Secondary | ICD-10-CM | POA: Diagnosis not present

## 2016-04-25 DIAGNOSIS — K449 Diaphragmatic hernia without obstruction or gangrene: Secondary | ICD-10-CM | POA: Diagnosis not present

## 2016-04-25 DIAGNOSIS — D259 Leiomyoma of uterus, unspecified: Secondary | ICD-10-CM | POA: Diagnosis not present

## 2016-04-25 DIAGNOSIS — R1084 Generalized abdominal pain: Secondary | ICD-10-CM | POA: Diagnosis not present

## 2016-04-28 DIAGNOSIS — R938 Abnormal findings on diagnostic imaging of other specified body structures: Secondary | ICD-10-CM | POA: Diagnosis not present

## 2016-04-29 DIAGNOSIS — M47816 Spondylosis without myelopathy or radiculopathy, lumbar region: Secondary | ICD-10-CM | POA: Diagnosis not present

## 2016-04-29 DIAGNOSIS — M9903 Segmental and somatic dysfunction of lumbar region: Secondary | ICD-10-CM | POA: Diagnosis not present

## 2016-04-29 DIAGNOSIS — M9905 Segmental and somatic dysfunction of pelvic region: Secondary | ICD-10-CM | POA: Diagnosis not present

## 2016-04-29 DIAGNOSIS — M9901 Segmental and somatic dysfunction of cervical region: Secondary | ICD-10-CM | POA: Diagnosis not present

## 2016-05-01 DIAGNOSIS — M9903 Segmental and somatic dysfunction of lumbar region: Secondary | ICD-10-CM | POA: Diagnosis not present

## 2016-05-01 DIAGNOSIS — M9901 Segmental and somatic dysfunction of cervical region: Secondary | ICD-10-CM | POA: Diagnosis not present

## 2016-05-01 DIAGNOSIS — M47816 Spondylosis without myelopathy or radiculopathy, lumbar region: Secondary | ICD-10-CM | POA: Diagnosis not present

## 2016-05-01 DIAGNOSIS — K219 Gastro-esophageal reflux disease without esophagitis: Secondary | ICD-10-CM | POA: Diagnosis not present

## 2016-05-01 DIAGNOSIS — R1310 Dysphagia, unspecified: Secondary | ICD-10-CM | POA: Diagnosis not present

## 2016-05-01 DIAGNOSIS — M9905 Segmental and somatic dysfunction of pelvic region: Secondary | ICD-10-CM | POA: Diagnosis not present

## 2016-05-02 DIAGNOSIS — M8589 Other specified disorders of bone density and structure, multiple sites: Secondary | ICD-10-CM | POA: Diagnosis not present

## 2016-05-02 DIAGNOSIS — Z13820 Encounter for screening for osteoporosis: Secondary | ICD-10-CM | POA: Diagnosis not present

## 2016-05-06 DIAGNOSIS — M9905 Segmental and somatic dysfunction of pelvic region: Secondary | ICD-10-CM | POA: Diagnosis not present

## 2016-05-06 DIAGNOSIS — M9901 Segmental and somatic dysfunction of cervical region: Secondary | ICD-10-CM | POA: Diagnosis not present

## 2016-05-06 DIAGNOSIS — M47816 Spondylosis without myelopathy or radiculopathy, lumbar region: Secondary | ICD-10-CM | POA: Diagnosis not present

## 2016-05-06 DIAGNOSIS — M9903 Segmental and somatic dysfunction of lumbar region: Secondary | ICD-10-CM | POA: Diagnosis not present

## 2016-05-08 DIAGNOSIS — M9901 Segmental and somatic dysfunction of cervical region: Secondary | ICD-10-CM | POA: Diagnosis not present

## 2016-05-08 DIAGNOSIS — M9903 Segmental and somatic dysfunction of lumbar region: Secondary | ICD-10-CM | POA: Diagnosis not present

## 2016-05-08 DIAGNOSIS — M9905 Segmental and somatic dysfunction of pelvic region: Secondary | ICD-10-CM | POA: Diagnosis not present

## 2016-05-08 DIAGNOSIS — M47816 Spondylosis without myelopathy or radiculopathy, lumbar region: Secondary | ICD-10-CM | POA: Diagnosis not present

## 2016-05-12 DIAGNOSIS — R1084 Generalized abdominal pain: Secondary | ICD-10-CM | POA: Diagnosis not present

## 2016-05-12 DIAGNOSIS — K449 Diaphragmatic hernia without obstruction or gangrene: Secondary | ICD-10-CM | POA: Diagnosis not present

## 2016-05-12 DIAGNOSIS — N952 Postmenopausal atrophic vaginitis: Secondary | ICD-10-CM | POA: Diagnosis not present

## 2016-05-12 DIAGNOSIS — R928 Other abnormal and inconclusive findings on diagnostic imaging of breast: Secondary | ICD-10-CM | POA: Diagnosis not present

## 2016-05-12 DIAGNOSIS — C719 Malignant neoplasm of brain, unspecified: Secondary | ICD-10-CM | POA: Diagnosis not present

## 2016-05-12 DIAGNOSIS — N941 Unspecified dyspareunia: Secondary | ICD-10-CM | POA: Diagnosis not present

## 2016-05-12 DIAGNOSIS — N9489 Other specified conditions associated with female genital organs and menstrual cycle: Secondary | ICD-10-CM | POA: Diagnosis not present

## 2016-05-13 DIAGNOSIS — M47816 Spondylosis without myelopathy or radiculopathy, lumbar region: Secondary | ICD-10-CM | POA: Diagnosis not present

## 2016-05-13 DIAGNOSIS — M9901 Segmental and somatic dysfunction of cervical region: Secondary | ICD-10-CM | POA: Diagnosis not present

## 2016-05-13 DIAGNOSIS — M9905 Segmental and somatic dysfunction of pelvic region: Secondary | ICD-10-CM | POA: Diagnosis not present

## 2016-05-13 DIAGNOSIS — M9903 Segmental and somatic dysfunction of lumbar region: Secondary | ICD-10-CM | POA: Diagnosis not present

## 2016-05-20 DIAGNOSIS — M47816 Spondylosis without myelopathy or radiculopathy, lumbar region: Secondary | ICD-10-CM | POA: Diagnosis not present

## 2016-05-20 DIAGNOSIS — M9903 Segmental and somatic dysfunction of lumbar region: Secondary | ICD-10-CM | POA: Diagnosis not present

## 2016-05-20 DIAGNOSIS — M9901 Segmental and somatic dysfunction of cervical region: Secondary | ICD-10-CM | POA: Diagnosis not present

## 2016-05-20 DIAGNOSIS — M9905 Segmental and somatic dysfunction of pelvic region: Secondary | ICD-10-CM | POA: Diagnosis not present

## 2016-05-21 DIAGNOSIS — J0110 Acute frontal sinusitis, unspecified: Secondary | ICD-10-CM | POA: Diagnosis not present

## 2016-05-27 DIAGNOSIS — M9905 Segmental and somatic dysfunction of pelvic region: Secondary | ICD-10-CM | POA: Diagnosis not present

## 2016-05-27 DIAGNOSIS — M9901 Segmental and somatic dysfunction of cervical region: Secondary | ICD-10-CM | POA: Diagnosis not present

## 2016-05-27 DIAGNOSIS — M9903 Segmental and somatic dysfunction of lumbar region: Secondary | ICD-10-CM | POA: Diagnosis not present

## 2016-05-27 DIAGNOSIS — M47816 Spondylosis without myelopathy or radiculopathy, lumbar region: Secondary | ICD-10-CM | POA: Diagnosis not present

## 2016-05-28 DIAGNOSIS — D696 Thrombocytopenia, unspecified: Secondary | ICD-10-CM | POA: Diagnosis not present

## 2016-05-28 DIAGNOSIS — Z79899 Other long term (current) drug therapy: Secondary | ICD-10-CM | POA: Diagnosis not present

## 2016-05-28 DIAGNOSIS — G3189 Other specified degenerative diseases of nervous system: Secondary | ICD-10-CM | POA: Diagnosis not present

## 2016-05-28 DIAGNOSIS — C712 Malignant neoplasm of temporal lobe: Secondary | ICD-10-CM | POA: Diagnosis not present

## 2016-05-29 DIAGNOSIS — E039 Hypothyroidism, unspecified: Secondary | ICD-10-CM | POA: Diagnosis not present

## 2016-05-29 DIAGNOSIS — G40909 Epilepsy, unspecified, not intractable, without status epilepticus: Secondary | ICD-10-CM | POA: Diagnosis not present

## 2016-05-29 DIAGNOSIS — D696 Thrombocytopenia, unspecified: Secondary | ICD-10-CM | POA: Diagnosis not present

## 2016-05-29 DIAGNOSIS — F419 Anxiety disorder, unspecified: Secondary | ICD-10-CM | POA: Diagnosis not present

## 2016-05-29 DIAGNOSIS — E0789 Other specified disorders of thyroid: Secondary | ICD-10-CM | POA: Diagnosis not present

## 2016-05-29 DIAGNOSIS — Z923 Personal history of irradiation: Secondary | ICD-10-CM | POA: Diagnosis not present

## 2016-05-29 DIAGNOSIS — B1910 Unspecified viral hepatitis B without hepatic coma: Secondary | ICD-10-CM | POA: Diagnosis not present

## 2016-05-29 DIAGNOSIS — R410 Disorientation, unspecified: Secondary | ICD-10-CM | POA: Diagnosis not present

## 2016-05-29 DIAGNOSIS — K449 Diaphragmatic hernia without obstruction or gangrene: Secondary | ICD-10-CM | POA: Diagnosis not present

## 2016-05-29 DIAGNOSIS — K219 Gastro-esophageal reflux disease without esophagitis: Secondary | ICD-10-CM | POA: Diagnosis not present

## 2016-05-29 DIAGNOSIS — Z79899 Other long term (current) drug therapy: Secondary | ICD-10-CM | POA: Diagnosis not present

## 2016-05-29 DIAGNOSIS — C719 Malignant neoplasm of brain, unspecified: Secondary | ICD-10-CM | POA: Diagnosis not present

## 2016-05-29 DIAGNOSIS — K712 Toxic liver disease with acute hepatitis: Secondary | ICD-10-CM | POA: Diagnosis not present

## 2016-05-30 DIAGNOSIS — F332 Major depressive disorder, recurrent severe without psychotic features: Secondary | ICD-10-CM | POA: Diagnosis not present

## 2016-06-02 DIAGNOSIS — F419 Anxiety disorder, unspecified: Secondary | ICD-10-CM | POA: Diagnosis not present

## 2016-06-02 DIAGNOSIS — C719 Malignant neoplasm of brain, unspecified: Secondary | ICD-10-CM | POA: Diagnosis not present

## 2016-06-02 DIAGNOSIS — R4189 Other symptoms and signs involving cognitive functions and awareness: Secondary | ICD-10-CM | POA: Diagnosis not present

## 2016-06-02 DIAGNOSIS — R51 Headache: Secondary | ICD-10-CM | POA: Diagnosis not present

## 2016-06-03 DIAGNOSIS — N952 Postmenopausal atrophic vaginitis: Secondary | ICD-10-CM | POA: Diagnosis not present

## 2016-06-03 DIAGNOSIS — N941 Unspecified dyspareunia: Secondary | ICD-10-CM | POA: Diagnosis not present

## 2016-06-05 DIAGNOSIS — R102 Pelvic and perineal pain: Secondary | ICD-10-CM | POA: Diagnosis not present

## 2016-06-05 DIAGNOSIS — N941 Unspecified dyspareunia: Secondary | ICD-10-CM | POA: Diagnosis not present

## 2016-06-06 DIAGNOSIS — F332 Major depressive disorder, recurrent severe without psychotic features: Secondary | ICD-10-CM | POA: Diagnosis not present

## 2016-06-09 DIAGNOSIS — R928 Other abnormal and inconclusive findings on diagnostic imaging of breast: Secondary | ICD-10-CM | POA: Diagnosis not present

## 2016-06-09 DIAGNOSIS — N6320 Unspecified lump in the left breast, unspecified quadrant: Secondary | ICD-10-CM | POA: Diagnosis not present

## 2016-06-09 DIAGNOSIS — N63 Unspecified lump in unspecified breast: Secondary | ICD-10-CM | POA: Diagnosis not present

## 2016-06-10 DIAGNOSIS — M9903 Segmental and somatic dysfunction of lumbar region: Secondary | ICD-10-CM | POA: Diagnosis not present

## 2016-06-10 DIAGNOSIS — R102 Pelvic and perineal pain: Secondary | ICD-10-CM | POA: Diagnosis not present

## 2016-06-10 DIAGNOSIS — M9905 Segmental and somatic dysfunction of pelvic region: Secondary | ICD-10-CM | POA: Diagnosis not present

## 2016-06-10 DIAGNOSIS — N941 Unspecified dyspareunia: Secondary | ICD-10-CM | POA: Diagnosis not present

## 2016-06-10 DIAGNOSIS — M9901 Segmental and somatic dysfunction of cervical region: Secondary | ICD-10-CM | POA: Diagnosis not present

## 2016-06-10 DIAGNOSIS — M47816 Spondylosis without myelopathy or radiculopathy, lumbar region: Secondary | ICD-10-CM | POA: Diagnosis not present

## 2016-06-13 DIAGNOSIS — F332 Major depressive disorder, recurrent severe without psychotic features: Secondary | ICD-10-CM | POA: Diagnosis not present

## 2016-06-17 DIAGNOSIS — N941 Unspecified dyspareunia: Secondary | ICD-10-CM | POA: Diagnosis not present

## 2016-06-17 DIAGNOSIS — R102 Pelvic and perineal pain: Secondary | ICD-10-CM | POA: Diagnosis not present

## 2016-06-19 DIAGNOSIS — L219 Seborrheic dermatitis, unspecified: Secondary | ICD-10-CM | POA: Diagnosis not present

## 2016-06-19 DIAGNOSIS — D485 Neoplasm of uncertain behavior of skin: Secondary | ICD-10-CM | POA: Diagnosis not present

## 2016-06-19 DIAGNOSIS — B351 Tinea unguium: Secondary | ICD-10-CM | POA: Diagnosis not present

## 2016-06-19 DIAGNOSIS — L820 Inflamed seborrheic keratosis: Secondary | ICD-10-CM | POA: Diagnosis not present

## 2016-06-20 DIAGNOSIS — F332 Major depressive disorder, recurrent severe without psychotic features: Secondary | ICD-10-CM | POA: Diagnosis not present

## 2016-06-23 DIAGNOSIS — B373 Candidiasis of vulva and vagina: Secondary | ICD-10-CM | POA: Diagnosis not present

## 2016-06-23 DIAGNOSIS — N952 Postmenopausal atrophic vaginitis: Secondary | ICD-10-CM | POA: Diagnosis not present

## 2016-06-23 DIAGNOSIS — R300 Dysuria: Secondary | ICD-10-CM | POA: Diagnosis not present

## 2016-06-24 DIAGNOSIS — Z9889 Other specified postprocedural states: Secondary | ICD-10-CM | POA: Diagnosis not present

## 2016-06-24 DIAGNOSIS — F0390 Unspecified dementia without behavioral disturbance: Secondary | ICD-10-CM | POA: Diagnosis not present

## 2016-06-24 DIAGNOSIS — F419 Anxiety disorder, unspecified: Secondary | ICD-10-CM | POA: Diagnosis not present

## 2016-06-26 DIAGNOSIS — R102 Pelvic and perineal pain: Secondary | ICD-10-CM | POA: Diagnosis not present

## 2016-06-26 DIAGNOSIS — N941 Unspecified dyspareunia: Secondary | ICD-10-CM | POA: Diagnosis not present

## 2016-06-27 DIAGNOSIS — F332 Major depressive disorder, recurrent severe without psychotic features: Secondary | ICD-10-CM | POA: Diagnosis not present

## 2016-06-29 DIAGNOSIS — C712 Malignant neoplasm of temporal lobe: Secondary | ICD-10-CM | POA: Diagnosis not present

## 2016-06-29 DIAGNOSIS — R930 Abnormal findings on diagnostic imaging of skull and head, not elsewhere classified: Secondary | ICD-10-CM | POA: Diagnosis not present

## 2016-06-29 DIAGNOSIS — R609 Edema, unspecified: Secondary | ICD-10-CM | POA: Diagnosis not present

## 2016-06-30 DIAGNOSIS — R1310 Dysphagia, unspecified: Secondary | ICD-10-CM | POA: Diagnosis not present

## 2016-06-30 DIAGNOSIS — R1312 Dysphagia, oropharyngeal phase: Secondary | ICD-10-CM | POA: Diagnosis not present

## 2016-07-01 DIAGNOSIS — N941 Unspecified dyspareunia: Secondary | ICD-10-CM | POA: Diagnosis not present

## 2016-07-01 DIAGNOSIS — F458 Other somatoform disorders: Secondary | ICD-10-CM | POA: Diagnosis not present

## 2016-07-01 DIAGNOSIS — R102 Pelvic and perineal pain: Secondary | ICD-10-CM | POA: Diagnosis not present

## 2016-07-03 DIAGNOSIS — Z923 Personal history of irradiation: Secondary | ICD-10-CM | POA: Diagnosis not present

## 2016-07-03 DIAGNOSIS — F419 Anxiety disorder, unspecified: Secondary | ICD-10-CM | POA: Diagnosis not present

## 2016-07-03 DIAGNOSIS — C712 Malignant neoplasm of temporal lobe: Secondary | ICD-10-CM | POA: Diagnosis not present

## 2016-07-03 DIAGNOSIS — E039 Hypothyroidism, unspecified: Secondary | ICD-10-CM | POA: Diagnosis not present

## 2016-07-03 DIAGNOSIS — L820 Inflamed seborrheic keratosis: Secondary | ICD-10-CM | POA: Diagnosis not present

## 2016-07-03 DIAGNOSIS — Z79899 Other long term (current) drug therapy: Secondary | ICD-10-CM | POA: Diagnosis not present

## 2016-07-03 DIAGNOSIS — Z9889 Other specified postprocedural states: Secondary | ICD-10-CM | POA: Diagnosis not present

## 2016-07-03 DIAGNOSIS — G40909 Epilepsy, unspecified, not intractable, without status epilepticus: Secondary | ICD-10-CM | POA: Diagnosis not present

## 2016-07-03 DIAGNOSIS — L853 Xerosis cutis: Secondary | ICD-10-CM | POA: Diagnosis not present

## 2016-07-03 DIAGNOSIS — B1910 Unspecified viral hepatitis B without hepatic coma: Secondary | ICD-10-CM | POA: Diagnosis not present

## 2016-07-03 DIAGNOSIS — R410 Disorientation, unspecified: Secondary | ICD-10-CM | POA: Diagnosis not present

## 2016-07-03 DIAGNOSIS — R609 Edema, unspecified: Secondary | ICD-10-CM | POA: Diagnosis not present

## 2016-07-03 DIAGNOSIS — C719 Malignant neoplasm of brain, unspecified: Secondary | ICD-10-CM | POA: Diagnosis not present

## 2016-07-03 DIAGNOSIS — K219 Gastro-esophageal reflux disease without esophagitis: Secondary | ICD-10-CM | POA: Diagnosis not present

## 2016-07-03 DIAGNOSIS — L219 Seborrheic dermatitis, unspecified: Secondary | ICD-10-CM | POA: Diagnosis not present

## 2016-07-03 DIAGNOSIS — K449 Diaphragmatic hernia without obstruction or gangrene: Secondary | ICD-10-CM | POA: Diagnosis not present

## 2016-07-04 DIAGNOSIS — F332 Major depressive disorder, recurrent severe without psychotic features: Secondary | ICD-10-CM | POA: Diagnosis not present

## 2016-07-04 DIAGNOSIS — C719 Malignant neoplasm of brain, unspecified: Secondary | ICD-10-CM | POA: Diagnosis not present

## 2016-07-04 DIAGNOSIS — R4189 Other symptoms and signs involving cognitive functions and awareness: Secondary | ICD-10-CM | POA: Diagnosis not present

## 2016-07-08 DIAGNOSIS — N941 Unspecified dyspareunia: Secondary | ICD-10-CM | POA: Diagnosis not present

## 2016-07-08 DIAGNOSIS — R102 Pelvic and perineal pain: Secondary | ICD-10-CM | POA: Diagnosis not present

## 2016-07-08 DIAGNOSIS — K759 Inflammatory liver disease, unspecified: Secondary | ICD-10-CM | POA: Diagnosis not present

## 2016-07-08 DIAGNOSIS — Z01810 Encounter for preprocedural cardiovascular examination: Secondary | ICD-10-CM | POA: Diagnosis not present

## 2016-07-08 DIAGNOSIS — K219 Gastro-esophageal reflux disease without esophagitis: Secondary | ICD-10-CM | POA: Diagnosis not present

## 2016-07-08 DIAGNOSIS — D696 Thrombocytopenia, unspecified: Secondary | ICD-10-CM | POA: Diagnosis not present

## 2016-07-08 DIAGNOSIS — C712 Malignant neoplasm of temporal lobe: Secondary | ICD-10-CM | POA: Diagnosis not present

## 2016-07-08 DIAGNOSIS — K449 Diaphragmatic hernia without obstruction or gangrene: Secondary | ICD-10-CM | POA: Diagnosis not present

## 2016-07-08 DIAGNOSIS — E039 Hypothyroidism, unspecified: Secondary | ICD-10-CM | POA: Diagnosis not present

## 2016-07-08 DIAGNOSIS — Z79899 Other long term (current) drug therapy: Secondary | ICD-10-CM | POA: Diagnosis not present

## 2016-07-10 DIAGNOSIS — Z79899 Other long term (current) drug therapy: Secondary | ICD-10-CM | POA: Diagnosis not present

## 2016-07-10 DIAGNOSIS — C719 Malignant neoplasm of brain, unspecified: Secondary | ICD-10-CM | POA: Diagnosis not present

## 2016-07-10 DIAGNOSIS — K449 Diaphragmatic hernia without obstruction or gangrene: Secondary | ICD-10-CM | POA: Diagnosis present

## 2016-07-10 DIAGNOSIS — E039 Hypothyroidism, unspecified: Secondary | ICD-10-CM | POA: Diagnosis present

## 2016-07-10 DIAGNOSIS — G936 Cerebral edema: Secondary | ICD-10-CM | POA: Diagnosis not present

## 2016-07-10 DIAGNOSIS — C712 Malignant neoplasm of temporal lobe: Secondary | ICD-10-CM | POA: Diagnosis present

## 2016-07-10 DIAGNOSIS — Z9221 Personal history of antineoplastic chemotherapy: Secondary | ICD-10-CM | POA: Diagnosis not present

## 2016-07-10 DIAGNOSIS — K219 Gastro-esophageal reflux disease without esophagitis: Secondary | ICD-10-CM | POA: Diagnosis present

## 2016-07-10 DIAGNOSIS — Z923 Personal history of irradiation: Secondary | ICD-10-CM | POA: Diagnosis not present

## 2016-07-17 DIAGNOSIS — R531 Weakness: Secondary | ICD-10-CM | POA: Diagnosis present

## 2016-07-17 DIAGNOSIS — E46 Unspecified protein-calorie malnutrition: Secondary | ICD-10-CM | POA: Diagnosis present

## 2016-07-17 DIAGNOSIS — Z48811 Encounter for surgical aftercare following surgery on the nervous system: Secondary | ICD-10-CM | POA: Diagnosis not present

## 2016-07-17 DIAGNOSIS — I82409 Acute embolism and thrombosis of unspecified deep veins of unspecified lower extremity: Secondary | ICD-10-CM | POA: Diagnosis not present

## 2016-07-17 DIAGNOSIS — C719 Malignant neoplasm of brain, unspecified: Secondary | ICD-10-CM | POA: Diagnosis not present

## 2016-07-17 DIAGNOSIS — M6281 Muscle weakness (generalized): Secondary | ICD-10-CM | POA: Diagnosis not present

## 2016-07-17 DIAGNOSIS — E079 Disorder of thyroid, unspecified: Secondary | ICD-10-CM | POA: Diagnosis present

## 2016-07-17 DIAGNOSIS — G936 Cerebral edema: Secondary | ICD-10-CM | POA: Diagnosis present

## 2016-07-17 DIAGNOSIS — R1310 Dysphagia, unspecified: Secondary | ICD-10-CM | POA: Diagnosis present

## 2016-07-17 DIAGNOSIS — H53462 Homonymous bilateral field defects, left side: Secondary | ICD-10-CM | POA: Diagnosis present

## 2016-07-17 DIAGNOSIS — S32009S Unspecified fracture of unspecified lumbar vertebra, sequela: Secondary | ICD-10-CM | POA: Diagnosis not present

## 2016-07-17 DIAGNOSIS — S22009D Unspecified fracture of unspecified thoracic vertebra, subsequent encounter for fracture with routine healing: Secondary | ICD-10-CM | POA: Diagnosis not present

## 2016-07-17 DIAGNOSIS — R079 Chest pain, unspecified: Secondary | ICD-10-CM | POA: Diagnosis not present

## 2016-07-17 DIAGNOSIS — G8194 Hemiplegia, unspecified affecting left nondominant side: Secondary | ICD-10-CM | POA: Diagnosis present

## 2016-07-17 DIAGNOSIS — J929 Pleural plaque without asbestos: Secondary | ICD-10-CM | POA: Diagnosis not present

## 2016-07-17 DIAGNOSIS — R918 Other nonspecific abnormal finding of lung field: Secondary | ICD-10-CM | POA: Diagnosis not present

## 2016-07-17 DIAGNOSIS — G893 Neoplasm related pain (acute) (chronic): Secondary | ICD-10-CM | POA: Diagnosis present

## 2016-07-17 DIAGNOSIS — M25551 Pain in right hip: Secondary | ICD-10-CM | POA: Diagnosis not present

## 2016-07-17 DIAGNOSIS — C712 Malignant neoplasm of temporal lobe: Secondary | ICD-10-CM | POA: Diagnosis present

## 2016-07-17 DIAGNOSIS — I82411 Acute embolism and thrombosis of right femoral vein: Secondary | ICD-10-CM | POA: Diagnosis not present

## 2016-07-17 DIAGNOSIS — R102 Pelvic and perineal pain: Secondary | ICD-10-CM | POA: Diagnosis not present

## 2016-07-17 DIAGNOSIS — M8580 Other specified disorders of bone density and structure, unspecified site: Secondary | ICD-10-CM | POA: Diagnosis not present

## 2016-07-17 DIAGNOSIS — M25512 Pain in left shoulder: Secondary | ICD-10-CM | POA: Diagnosis not present

## 2016-07-17 DIAGNOSIS — R6889 Other general symptoms and signs: Secondary | ICD-10-CM | POA: Diagnosis present

## 2016-07-17 DIAGNOSIS — R51 Headache: Secondary | ICD-10-CM | POA: Diagnosis not present

## 2016-07-17 DIAGNOSIS — S32009D Unspecified fracture of unspecified lumbar vertebra, subsequent encounter for fracture with routine healing: Secondary | ICD-10-CM | POA: Diagnosis not present

## 2016-07-17 DIAGNOSIS — S2232XA Fracture of one rib, left side, initial encounter for closed fracture: Secondary | ICD-10-CM | POA: Diagnosis not present

## 2016-07-17 DIAGNOSIS — K449 Diaphragmatic hernia without obstruction or gangrene: Secondary | ICD-10-CM | POA: Diagnosis present

## 2016-07-17 DIAGNOSIS — G4089 Other seizures: Secondary | ICD-10-CM | POA: Diagnosis not present

## 2016-07-17 DIAGNOSIS — I82401 Acute embolism and thrombosis of unspecified deep veins of right lower extremity: Secondary | ICD-10-CM | POA: Diagnosis not present

## 2016-07-17 DIAGNOSIS — Z7409 Other reduced mobility: Secondary | ICD-10-CM | POA: Diagnosis not present

## 2016-07-17 DIAGNOSIS — R569 Unspecified convulsions: Secondary | ICD-10-CM | POA: Diagnosis not present

## 2016-07-17 DIAGNOSIS — R4189 Other symptoms and signs involving cognitive functions and awareness: Secondary | ICD-10-CM | POA: Diagnosis not present

## 2016-07-17 DIAGNOSIS — D6959 Other secondary thrombocytopenia: Secondary | ICD-10-CM | POA: Diagnosis not present

## 2016-07-17 DIAGNOSIS — K219 Gastro-esophageal reflux disease without esophagitis: Secondary | ICD-10-CM | POA: Diagnosis present

## 2016-07-17 DIAGNOSIS — G40909 Epilepsy, unspecified, not intractable, without status epilepticus: Secondary | ICD-10-CM | POA: Diagnosis present

## 2016-07-17 DIAGNOSIS — R296 Repeated falls: Secondary | ICD-10-CM | POA: Diagnosis present

## 2016-07-17 DIAGNOSIS — I82492 Acute embolism and thrombosis of other specified deep vein of left lower extremity: Secondary | ICD-10-CM | POA: Diagnosis not present

## 2016-07-17 DIAGNOSIS — Z95828 Presence of other vascular implants and grafts: Secondary | ICD-10-CM | POA: Diagnosis not present

## 2016-07-17 DIAGNOSIS — I69398 Other sequelae of cerebral infarction: Secondary | ICD-10-CM | POA: Diagnosis not present

## 2016-07-17 DIAGNOSIS — R600 Localized edema: Secondary | ICD-10-CM | POA: Diagnosis not present

## 2016-07-17 DIAGNOSIS — D696 Thrombocytopenia, unspecified: Secondary | ICD-10-CM | POA: Diagnosis present

## 2016-07-17 DIAGNOSIS — R2689 Other abnormalities of gait and mobility: Secondary | ICD-10-CM | POA: Diagnosis present

## 2016-07-17 DIAGNOSIS — H538 Other visual disturbances: Secondary | ICD-10-CM | POA: Diagnosis present

## 2016-07-17 DIAGNOSIS — R41841 Cognitive communication deficit: Secondary | ICD-10-CM | POA: Diagnosis present

## 2016-07-17 DIAGNOSIS — I82442 Acute embolism and thrombosis of left tibial vein: Secondary | ICD-10-CM | POA: Diagnosis present

## 2016-07-17 DIAGNOSIS — F332 Major depressive disorder, recurrent severe without psychotic features: Secondary | ICD-10-CM | POA: Diagnosis not present

## 2016-07-17 DIAGNOSIS — E039 Hypothyroidism, unspecified: Secondary | ICD-10-CM | POA: Diagnosis present

## 2016-07-17 DIAGNOSIS — S299XXA Unspecified injury of thorax, initial encounter: Secondary | ICD-10-CM | POA: Diagnosis not present

## 2016-07-17 DIAGNOSIS — N941 Unspecified dyspareunia: Secondary | ICD-10-CM | POA: Diagnosis not present

## 2016-07-17 DIAGNOSIS — Z483 Aftercare following surgery for neoplasm: Secondary | ICD-10-CM | POA: Diagnosis not present

## 2016-07-17 DIAGNOSIS — Z4889 Encounter for other specified surgical aftercare: Secondary | ICD-10-CM | POA: Diagnosis not present

## 2016-07-18 DIAGNOSIS — I82442 Acute embolism and thrombosis of left tibial vein: Secondary | ICD-10-CM | POA: Diagnosis not present

## 2016-07-19 DIAGNOSIS — M25551 Pain in right hip: Secondary | ICD-10-CM | POA: Diagnosis not present

## 2016-07-19 DIAGNOSIS — M25512 Pain in left shoulder: Secondary | ICD-10-CM | POA: Diagnosis not present

## 2016-07-19 DIAGNOSIS — M8580 Other specified disorders of bone density and structure, unspecified site: Secondary | ICD-10-CM | POA: Diagnosis not present

## 2016-07-20 DIAGNOSIS — C719 Malignant neoplasm of brain, unspecified: Secondary | ICD-10-CM | POA: Diagnosis not present

## 2016-07-20 DIAGNOSIS — R918 Other nonspecific abnormal finding of lung field: Secondary | ICD-10-CM | POA: Diagnosis not present

## 2016-07-20 DIAGNOSIS — S299XXA Unspecified injury of thorax, initial encounter: Secondary | ICD-10-CM | POA: Diagnosis not present

## 2016-07-20 DIAGNOSIS — S2232XA Fracture of one rib, left side, initial encounter for closed fracture: Secondary | ICD-10-CM | POA: Diagnosis not present

## 2016-07-20 DIAGNOSIS — J929 Pleural plaque without asbestos: Secondary | ICD-10-CM | POA: Diagnosis not present

## 2016-07-20 DIAGNOSIS — R079 Chest pain, unspecified: Secondary | ICD-10-CM | POA: Diagnosis not present

## 2016-07-30 DIAGNOSIS — I82411 Acute embolism and thrombosis of right femoral vein: Secondary | ICD-10-CM | POA: Diagnosis not present

## 2016-07-30 DIAGNOSIS — R600 Localized edema: Secondary | ICD-10-CM | POA: Diagnosis not present

## 2016-08-01 DIAGNOSIS — D696 Thrombocytopenia, unspecified: Secondary | ICD-10-CM | POA: Diagnosis not present

## 2016-08-01 DIAGNOSIS — I82401 Acute embolism and thrombosis of unspecified deep veins of right lower extremity: Secondary | ICD-10-CM | POA: Diagnosis not present

## 2016-08-01 DIAGNOSIS — G4089 Other seizures: Secondary | ICD-10-CM | POA: Diagnosis not present

## 2016-08-01 DIAGNOSIS — M6281 Muscle weakness (generalized): Secondary | ICD-10-CM | POA: Diagnosis not present

## 2016-08-01 DIAGNOSIS — R4189 Other symptoms and signs involving cognitive functions and awareness: Secondary | ICD-10-CM | POA: Diagnosis not present

## 2016-08-01 DIAGNOSIS — D6959 Other secondary thrombocytopenia: Secondary | ICD-10-CM | POA: Diagnosis not present

## 2016-08-01 DIAGNOSIS — I82409 Acute embolism and thrombosis of unspecified deep veins of unspecified lower extremity: Secondary | ICD-10-CM | POA: Diagnosis not present

## 2016-08-01 DIAGNOSIS — C719 Malignant neoplasm of brain, unspecified: Secondary | ICD-10-CM | POA: Diagnosis not present

## 2016-08-01 DIAGNOSIS — Z7409 Other reduced mobility: Secondary | ICD-10-CM | POA: Diagnosis not present

## 2016-08-01 DIAGNOSIS — R569 Unspecified convulsions: Secondary | ICD-10-CM | POA: Diagnosis not present

## 2016-08-01 DIAGNOSIS — Z95828 Presence of other vascular implants and grafts: Secondary | ICD-10-CM | POA: Diagnosis not present

## 2016-08-01 DIAGNOSIS — E039 Hypothyroidism, unspecified: Secondary | ICD-10-CM | POA: Diagnosis not present

## 2016-08-01 DIAGNOSIS — Z4889 Encounter for other specified surgical aftercare: Secondary | ICD-10-CM | POA: Diagnosis not present

## 2016-08-01 DIAGNOSIS — I82442 Acute embolism and thrombosis of left tibial vein: Secondary | ICD-10-CM | POA: Diagnosis not present

## 2016-08-01 DIAGNOSIS — R41841 Cognitive communication deficit: Secondary | ICD-10-CM | POA: Diagnosis not present

## 2016-08-01 DIAGNOSIS — Z48811 Encounter for surgical aftercare following surgery on the nervous system: Secondary | ICD-10-CM | POA: Diagnosis not present

## 2016-08-01 DIAGNOSIS — F332 Major depressive disorder, recurrent severe without psychotic features: Secondary | ICD-10-CM | POA: Diagnosis not present

## 2016-08-01 DIAGNOSIS — R2689 Other abnormalities of gait and mobility: Secondary | ICD-10-CM | POA: Diagnosis not present

## 2016-08-01 DIAGNOSIS — R1310 Dysphagia, unspecified: Secondary | ICD-10-CM | POA: Diagnosis not present

## 2016-08-04 DIAGNOSIS — Z8619 Personal history of other infectious and parasitic diseases: Secondary | ICD-10-CM | POA: Diagnosis not present

## 2016-08-04 DIAGNOSIS — K449 Diaphragmatic hernia without obstruction or gangrene: Secondary | ICD-10-CM | POA: Diagnosis not present

## 2016-08-04 DIAGNOSIS — I82409 Acute embolism and thrombosis of unspecified deep veins of unspecified lower extremity: Secondary | ICD-10-CM | POA: Diagnosis not present

## 2016-08-04 DIAGNOSIS — K219 Gastro-esophageal reflux disease without esophagitis: Secondary | ICD-10-CM | POA: Diagnosis not present

## 2016-08-04 DIAGNOSIS — F419 Anxiety disorder, unspecified: Secondary | ICD-10-CM | POA: Diagnosis not present

## 2016-08-04 DIAGNOSIS — M47816 Spondylosis without myelopathy or radiculopathy, lumbar region: Secondary | ICD-10-CM | POA: Diagnosis not present

## 2016-08-04 DIAGNOSIS — Z79899 Other long term (current) drug therapy: Secondary | ICD-10-CM | POA: Diagnosis not present

## 2016-08-04 DIAGNOSIS — C712 Malignant neoplasm of temporal lobe: Secondary | ICD-10-CM | POA: Diagnosis not present

## 2016-08-04 DIAGNOSIS — Z6822 Body mass index (BMI) 22.0-22.9, adult: Secondary | ICD-10-CM | POA: Diagnosis not present

## 2016-08-04 DIAGNOSIS — R51 Headache: Secondary | ICD-10-CM | POA: Diagnosis not present

## 2016-08-04 DIAGNOSIS — Z7901 Long term (current) use of anticoagulants: Secondary | ICD-10-CM | POA: Diagnosis not present

## 2016-08-04 DIAGNOSIS — R609 Edema, unspecified: Secondary | ICD-10-CM | POA: Diagnosis not present

## 2016-08-04 DIAGNOSIS — Z741 Need for assistance with personal care: Secondary | ICD-10-CM | POA: Diagnosis not present

## 2016-08-04 DIAGNOSIS — I82401 Acute embolism and thrombosis of unspecified deep veins of right lower extremity: Secondary | ICD-10-CM | POA: Diagnosis not present

## 2016-08-04 DIAGNOSIS — F0390 Unspecified dementia without behavioral disturbance: Secondary | ICD-10-CM | POA: Diagnosis not present

## 2016-08-04 DIAGNOSIS — S199XXA Unspecified injury of neck, initial encounter: Secondary | ICD-10-CM | POA: Diagnosis not present

## 2016-08-04 DIAGNOSIS — I82442 Acute embolism and thrombosis of left tibial vein: Secondary | ICD-10-CM | POA: Diagnosis not present

## 2016-08-04 DIAGNOSIS — G4089 Other seizures: Secondary | ICD-10-CM | POA: Diagnosis not present

## 2016-08-04 DIAGNOSIS — R52 Pain, unspecified: Secondary | ICD-10-CM | POA: Diagnosis not present

## 2016-08-04 DIAGNOSIS — I82403 Acute embolism and thrombosis of unspecified deep veins of lower extremity, bilateral: Secondary | ICD-10-CM | POA: Diagnosis not present

## 2016-08-04 DIAGNOSIS — Z9221 Personal history of antineoplastic chemotherapy: Secondary | ICD-10-CM | POA: Diagnosis not present

## 2016-08-04 DIAGNOSIS — D432 Neoplasm of uncertain behavior of brain, unspecified: Secondary | ICD-10-CM | POA: Diagnosis not present

## 2016-08-04 DIAGNOSIS — M7989 Other specified soft tissue disorders: Secondary | ICD-10-CM | POA: Diagnosis not present

## 2016-08-04 DIAGNOSIS — F332 Major depressive disorder, recurrent severe without psychotic features: Secondary | ICD-10-CM | POA: Diagnosis not present

## 2016-08-04 DIAGNOSIS — Z923 Personal history of irradiation: Secondary | ICD-10-CM | POA: Diagnosis not present

## 2016-08-04 DIAGNOSIS — S4980XA Other specified injuries of shoulder and upper arm, unspecified arm, initial encounter: Secondary | ICD-10-CM | POA: Diagnosis not present

## 2016-08-04 DIAGNOSIS — R410 Disorientation, unspecified: Secondary | ICD-10-CM | POA: Diagnosis not present

## 2016-08-04 DIAGNOSIS — E039 Hypothyroidism, unspecified: Secondary | ICD-10-CM | POA: Diagnosis not present

## 2016-08-04 DIAGNOSIS — M25552 Pain in left hip: Secondary | ICD-10-CM | POA: Diagnosis not present

## 2016-08-04 DIAGNOSIS — R4189 Other symptoms and signs involving cognitive functions and awareness: Secondary | ICD-10-CM | POA: Diagnosis not present

## 2016-08-04 DIAGNOSIS — R270 Ataxia, unspecified: Secondary | ICD-10-CM | POA: Diagnosis not present

## 2016-08-04 DIAGNOSIS — D649 Anemia, unspecified: Secondary | ICD-10-CM | POA: Diagnosis not present

## 2016-08-04 DIAGNOSIS — Z4889 Encounter for other specified surgical aftercare: Secondary | ICD-10-CM | POA: Diagnosis not present

## 2016-08-04 DIAGNOSIS — R1310 Dysphagia, unspecified: Secondary | ICD-10-CM | POA: Diagnosis not present

## 2016-08-04 DIAGNOSIS — C711 Malignant neoplasm of frontal lobe: Secondary | ICD-10-CM | POA: Diagnosis not present

## 2016-08-04 DIAGNOSIS — C719 Malignant neoplasm of brain, unspecified: Secondary | ICD-10-CM | POA: Diagnosis not present

## 2016-08-04 DIAGNOSIS — R2689 Other abnormalities of gait and mobility: Secondary | ICD-10-CM | POA: Diagnosis not present

## 2016-08-04 DIAGNOSIS — Z483 Aftercare following surgery for neoplasm: Secondary | ICD-10-CM | POA: Diagnosis not present

## 2016-08-04 DIAGNOSIS — D6959 Other secondary thrombocytopenia: Secondary | ICD-10-CM | POA: Diagnosis not present

## 2016-08-04 DIAGNOSIS — Z48811 Encounter for surgical aftercare following surgery on the nervous system: Secondary | ICD-10-CM | POA: Diagnosis not present

## 2016-08-04 DIAGNOSIS — Z95828 Presence of other vascular implants and grafts: Secondary | ICD-10-CM | POA: Diagnosis not present

## 2016-08-04 DIAGNOSIS — M6281 Muscle weakness (generalized): Secondary | ICD-10-CM | POA: Diagnosis not present

## 2016-08-04 DIAGNOSIS — I82509 Chronic embolism and thrombosis of unspecified deep veins of unspecified lower extremity: Secondary | ICD-10-CM | POA: Diagnosis not present

## 2016-08-04 DIAGNOSIS — G40909 Epilepsy, unspecified, not intractable, without status epilepticus: Secondary | ICD-10-CM | POA: Diagnosis not present

## 2016-08-04 DIAGNOSIS — E041 Nontoxic single thyroid nodule: Secondary | ICD-10-CM | POA: Diagnosis not present

## 2016-08-04 DIAGNOSIS — W19XXXA Unspecified fall, initial encounter: Secondary | ICD-10-CM | POA: Diagnosis not present

## 2016-08-04 DIAGNOSIS — M25512 Pain in left shoulder: Secondary | ICD-10-CM | POA: Diagnosis not present

## 2016-08-07 DIAGNOSIS — M6281 Muscle weakness (generalized): Secondary | ICD-10-CM | POA: Diagnosis not present

## 2016-08-07 DIAGNOSIS — R52 Pain, unspecified: Secondary | ICD-10-CM | POA: Diagnosis not present

## 2016-08-07 DIAGNOSIS — R51 Headache: Secondary | ICD-10-CM | POA: Diagnosis not present

## 2016-08-07 DIAGNOSIS — R270 Ataxia, unspecified: Secondary | ICD-10-CM | POA: Diagnosis not present

## 2016-08-07 DIAGNOSIS — Z48811 Encounter for surgical aftercare following surgery on the nervous system: Secondary | ICD-10-CM | POA: Diagnosis not present

## 2016-08-11 DIAGNOSIS — M6281 Muscle weakness (generalized): Secondary | ICD-10-CM | POA: Diagnosis not present

## 2016-08-11 DIAGNOSIS — Z483 Aftercare following surgery for neoplasm: Secondary | ICD-10-CM | POA: Diagnosis not present

## 2016-08-11 DIAGNOSIS — C712 Malignant neoplasm of temporal lobe: Secondary | ICD-10-CM | POA: Diagnosis not present

## 2016-08-11 DIAGNOSIS — R52 Pain, unspecified: Secondary | ICD-10-CM | POA: Diagnosis not present

## 2016-08-11 DIAGNOSIS — Z48811 Encounter for surgical aftercare following surgery on the nervous system: Secondary | ICD-10-CM | POA: Diagnosis not present

## 2016-08-11 DIAGNOSIS — R51 Headache: Secondary | ICD-10-CM | POA: Diagnosis not present

## 2016-08-11 DIAGNOSIS — R2689 Other abnormalities of gait and mobility: Secondary | ICD-10-CM | POA: Diagnosis not present

## 2016-08-11 DIAGNOSIS — R270 Ataxia, unspecified: Secondary | ICD-10-CM | POA: Diagnosis not present

## 2016-08-11 DIAGNOSIS — I82442 Acute embolism and thrombosis of left tibial vein: Secondary | ICD-10-CM | POA: Diagnosis not present

## 2016-08-12 DIAGNOSIS — M25552 Pain in left hip: Secondary | ICD-10-CM | POA: Diagnosis not present

## 2016-08-12 DIAGNOSIS — M47816 Spondylosis without myelopathy or radiculopathy, lumbar region: Secondary | ICD-10-CM | POA: Diagnosis not present

## 2016-08-12 DIAGNOSIS — R609 Edema, unspecified: Secondary | ICD-10-CM | POA: Diagnosis not present

## 2016-08-18 DIAGNOSIS — I82442 Acute embolism and thrombosis of left tibial vein: Secondary | ICD-10-CM | POA: Diagnosis not present

## 2016-08-18 DIAGNOSIS — R270 Ataxia, unspecified: Secondary | ICD-10-CM | POA: Diagnosis not present

## 2016-08-18 DIAGNOSIS — M6281 Muscle weakness (generalized): Secondary | ICD-10-CM | POA: Diagnosis not present

## 2016-08-18 DIAGNOSIS — C712 Malignant neoplasm of temporal lobe: Secondary | ICD-10-CM | POA: Diagnosis not present

## 2016-08-18 DIAGNOSIS — Z48811 Encounter for surgical aftercare following surgery on the nervous system: Secondary | ICD-10-CM | POA: Diagnosis not present

## 2016-08-18 DIAGNOSIS — R2689 Other abnormalities of gait and mobility: Secondary | ICD-10-CM | POA: Diagnosis not present

## 2016-08-18 DIAGNOSIS — R52 Pain, unspecified: Secondary | ICD-10-CM | POA: Diagnosis not present

## 2016-08-18 DIAGNOSIS — R51 Headache: Secondary | ICD-10-CM | POA: Diagnosis not present

## 2016-08-18 DIAGNOSIS — Z483 Aftercare following surgery for neoplasm: Secondary | ICD-10-CM | POA: Diagnosis not present

## 2016-08-25 DIAGNOSIS — C719 Malignant neoplasm of brain, unspecified: Secondary | ICD-10-CM | POA: Diagnosis not present

## 2016-08-25 DIAGNOSIS — F419 Anxiety disorder, unspecified: Secondary | ICD-10-CM | POA: Diagnosis not present

## 2016-08-25 DIAGNOSIS — R52 Pain, unspecified: Secondary | ICD-10-CM | POA: Diagnosis not present

## 2016-08-25 DIAGNOSIS — E039 Hypothyroidism, unspecified: Secondary | ICD-10-CM | POA: Diagnosis not present

## 2016-08-25 DIAGNOSIS — R410 Disorientation, unspecified: Secondary | ICD-10-CM | POA: Diagnosis not present

## 2016-08-25 DIAGNOSIS — K219 Gastro-esophageal reflux disease without esophagitis: Secondary | ICD-10-CM | POA: Diagnosis not present

## 2016-08-25 DIAGNOSIS — I82409 Acute embolism and thrombosis of unspecified deep veins of unspecified lower extremity: Secondary | ICD-10-CM | POA: Diagnosis not present

## 2016-08-25 DIAGNOSIS — M7989 Other specified soft tissue disorders: Secondary | ICD-10-CM | POA: Diagnosis not present

## 2016-08-25 DIAGNOSIS — R2689 Other abnormalities of gait and mobility: Secondary | ICD-10-CM | POA: Diagnosis not present

## 2016-08-25 DIAGNOSIS — R270 Ataxia, unspecified: Secondary | ICD-10-CM | POA: Diagnosis not present

## 2016-08-25 DIAGNOSIS — C711 Malignant neoplasm of frontal lobe: Secondary | ICD-10-CM | POA: Diagnosis not present

## 2016-08-25 DIAGNOSIS — K449 Diaphragmatic hernia without obstruction or gangrene: Secondary | ICD-10-CM | POA: Diagnosis not present

## 2016-08-25 DIAGNOSIS — C712 Malignant neoplasm of temporal lobe: Secondary | ICD-10-CM | POA: Diagnosis not present

## 2016-08-25 DIAGNOSIS — I82442 Acute embolism and thrombosis of left tibial vein: Secondary | ICD-10-CM | POA: Diagnosis not present

## 2016-08-25 DIAGNOSIS — G40909 Epilepsy, unspecified, not intractable, without status epilepticus: Secondary | ICD-10-CM | POA: Diagnosis not present

## 2016-08-25 DIAGNOSIS — I82509 Chronic embolism and thrombosis of unspecified deep veins of unspecified lower extremity: Secondary | ICD-10-CM | POA: Diagnosis not present

## 2016-08-25 DIAGNOSIS — M6281 Muscle weakness (generalized): Secondary | ICD-10-CM | POA: Diagnosis not present

## 2016-08-25 DIAGNOSIS — Z483 Aftercare following surgery for neoplasm: Secondary | ICD-10-CM | POA: Diagnosis not present

## 2016-08-25 DIAGNOSIS — Z48811 Encounter for surgical aftercare following surgery on the nervous system: Secondary | ICD-10-CM | POA: Diagnosis not present

## 2016-08-25 DIAGNOSIS — R51 Headache: Secondary | ICD-10-CM | POA: Diagnosis not present

## 2016-08-29 DIAGNOSIS — F332 Major depressive disorder, recurrent severe without psychotic features: Secondary | ICD-10-CM | POA: Diagnosis not present

## 2016-09-05 DIAGNOSIS — F332 Major depressive disorder, recurrent severe without psychotic features: Secondary | ICD-10-CM | POA: Diagnosis not present

## 2016-09-12 DIAGNOSIS — F332 Major depressive disorder, recurrent severe without psychotic features: Secondary | ICD-10-CM | POA: Diagnosis not present

## 2016-09-19 DIAGNOSIS — F332 Major depressive disorder, recurrent severe without psychotic features: Secondary | ICD-10-CM | POA: Diagnosis not present

## 2016-09-24 DIAGNOSIS — C719 Malignant neoplasm of brain, unspecified: Secondary | ICD-10-CM | POA: Diagnosis not present

## 2016-09-24 DIAGNOSIS — I82412 Acute embolism and thrombosis of left femoral vein: Secondary | ICD-10-CM | POA: Diagnosis not present

## 2016-09-24 DIAGNOSIS — Z95828 Presence of other vascular implants and grafts: Secondary | ICD-10-CM | POA: Diagnosis not present

## 2016-09-26 DIAGNOSIS — F332 Major depressive disorder, recurrent severe without psychotic features: Secondary | ICD-10-CM | POA: Diagnosis not present

## 2016-10-03 DIAGNOSIS — G4089 Other seizures: Secondary | ICD-10-CM | POA: Diagnosis not present

## 2016-10-03 DIAGNOSIS — R601 Generalized edema: Secondary | ICD-10-CM | POA: Diagnosis not present

## 2016-10-03 DIAGNOSIS — F332 Major depressive disorder, recurrent severe without psychotic features: Secondary | ICD-10-CM | POA: Diagnosis not present

## 2016-10-03 DIAGNOSIS — Z23 Encounter for immunization: Secondary | ICD-10-CM | POA: Diagnosis not present

## 2016-10-03 DIAGNOSIS — F0281 Dementia in other diseases classified elsewhere with behavioral disturbance: Secondary | ICD-10-CM | POA: Diagnosis not present

## 2016-10-03 DIAGNOSIS — C712 Malignant neoplasm of temporal lobe: Secondary | ICD-10-CM | POA: Diagnosis not present

## 2016-10-04 DIAGNOSIS — F332 Major depressive disorder, recurrent severe without psychotic features: Secondary | ICD-10-CM | POA: Diagnosis not present

## 2016-10-06 DIAGNOSIS — I82403 Acute embolism and thrombosis of unspecified deep veins of lower extremity, bilateral: Secondary | ICD-10-CM | POA: Diagnosis not present

## 2016-10-10 DIAGNOSIS — F332 Major depressive disorder, recurrent severe without psychotic features: Secondary | ICD-10-CM | POA: Diagnosis not present

## 2016-10-17 DIAGNOSIS — F332 Major depressive disorder, recurrent severe without psychotic features: Secondary | ICD-10-CM | POA: Diagnosis not present

## 2016-10-19 DIAGNOSIS — G4089 Other seizures: Secondary | ICD-10-CM | POA: Diagnosis not present

## 2016-10-19 DIAGNOSIS — D6959 Other secondary thrombocytopenia: Secondary | ICD-10-CM | POA: Diagnosis not present

## 2016-10-19 DIAGNOSIS — F0390 Unspecified dementia without behavioral disturbance: Secondary | ICD-10-CM | POA: Diagnosis not present

## 2016-10-19 DIAGNOSIS — I82403 Acute embolism and thrombosis of unspecified deep veins of lower extremity, bilateral: Secondary | ICD-10-CM | POA: Diagnosis not present

## 2016-10-19 DIAGNOSIS — C712 Malignant neoplasm of temporal lobe: Secondary | ICD-10-CM | POA: Diagnosis not present

## 2016-10-19 DIAGNOSIS — E039 Hypothyroidism, unspecified: Secondary | ICD-10-CM | POA: Diagnosis not present

## 2016-10-24 DIAGNOSIS — S0990XA Unspecified injury of head, initial encounter: Secondary | ICD-10-CM | POA: Diagnosis not present

## 2016-10-24 DIAGNOSIS — F332 Major depressive disorder, recurrent severe without psychotic features: Secondary | ICD-10-CM | POA: Diagnosis not present

## 2016-10-25 DIAGNOSIS — C719 Malignant neoplasm of brain, unspecified: Secondary | ICD-10-CM | POA: Diagnosis not present

## 2016-10-25 DIAGNOSIS — Z043 Encounter for examination and observation following other accident: Secondary | ICD-10-CM | POA: Diagnosis not present

## 2016-10-31 DIAGNOSIS — F332 Major depressive disorder, recurrent severe without psychotic features: Secondary | ICD-10-CM | POA: Diagnosis not present

## 2016-11-14 DIAGNOSIS — F332 Major depressive disorder, recurrent severe without psychotic features: Secondary | ICD-10-CM | POA: Diagnosis not present

## 2016-12-05 DIAGNOSIS — F332 Major depressive disorder, recurrent severe without psychotic features: Secondary | ICD-10-CM | POA: Diagnosis not present

## 2016-12-13 DIAGNOSIS — F332 Major depressive disorder, recurrent severe without psychotic features: Secondary | ICD-10-CM | POA: Diagnosis not present

## 2016-12-19 DIAGNOSIS — Z6822 Body mass index (BMI) 22.0-22.9, adult: Secondary | ICD-10-CM | POA: Diagnosis not present

## 2016-12-19 DIAGNOSIS — D649 Anemia, unspecified: Secondary | ICD-10-CM | POA: Diagnosis not present

## 2016-12-19 DIAGNOSIS — E039 Hypothyroidism, unspecified: Secondary | ICD-10-CM | POA: Diagnosis not present

## 2016-12-19 DIAGNOSIS — C712 Malignant neoplasm of temporal lobe: Secondary | ICD-10-CM | POA: Diagnosis not present

## 2016-12-19 DIAGNOSIS — Z9221 Personal history of antineoplastic chemotherapy: Secondary | ICD-10-CM | POA: Diagnosis not present

## 2016-12-19 DIAGNOSIS — G4089 Other seizures: Secondary | ICD-10-CM | POA: Diagnosis not present

## 2016-12-19 DIAGNOSIS — I82403 Acute embolism and thrombosis of unspecified deep veins of lower extremity, bilateral: Secondary | ICD-10-CM | POA: Diagnosis not present

## 2016-12-19 DIAGNOSIS — F0390 Unspecified dementia without behavioral disturbance: Secondary | ICD-10-CM | POA: Diagnosis not present

## 2016-12-19 DIAGNOSIS — Z741 Need for assistance with personal care: Secondary | ICD-10-CM | POA: Diagnosis not present

## 2016-12-19 DIAGNOSIS — F332 Major depressive disorder, recurrent severe without psychotic features: Secondary | ICD-10-CM | POA: Diagnosis not present

## 2016-12-19 DIAGNOSIS — R1310 Dysphagia, unspecified: Secondary | ICD-10-CM | POA: Diagnosis not present

## 2016-12-19 DIAGNOSIS — Z923 Personal history of irradiation: Secondary | ICD-10-CM | POA: Diagnosis not present

## 2016-12-19 DIAGNOSIS — Z8619 Personal history of other infectious and parasitic diseases: Secondary | ICD-10-CM | POA: Diagnosis not present

## 2016-12-19 DIAGNOSIS — Z95828 Presence of other vascular implants and grafts: Secondary | ICD-10-CM | POA: Diagnosis not present

## 2016-12-19 DIAGNOSIS — D6959 Other secondary thrombocytopenia: Secondary | ICD-10-CM | POA: Diagnosis not present

## 2016-12-23 DIAGNOSIS — E039 Hypothyroidism, unspecified: Secondary | ICD-10-CM | POA: Diagnosis not present

## 2016-12-23 DIAGNOSIS — D6959 Other secondary thrombocytopenia: Secondary | ICD-10-CM | POA: Diagnosis not present

## 2016-12-23 DIAGNOSIS — F0390 Unspecified dementia without behavioral disturbance: Secondary | ICD-10-CM | POA: Diagnosis not present

## 2016-12-23 DIAGNOSIS — G4089 Other seizures: Secondary | ICD-10-CM | POA: Diagnosis not present

## 2016-12-23 DIAGNOSIS — C712 Malignant neoplasm of temporal lobe: Secondary | ICD-10-CM | POA: Diagnosis not present

## 2016-12-23 DIAGNOSIS — I82403 Acute embolism and thrombosis of unspecified deep veins of lower extremity, bilateral: Secondary | ICD-10-CM | POA: Diagnosis not present

## 2016-12-25 DIAGNOSIS — E039 Hypothyroidism, unspecified: Secondary | ICD-10-CM | POA: Diagnosis not present

## 2016-12-25 DIAGNOSIS — I82403 Acute embolism and thrombosis of unspecified deep veins of lower extremity, bilateral: Secondary | ICD-10-CM | POA: Diagnosis not present

## 2016-12-25 DIAGNOSIS — F0390 Unspecified dementia without behavioral disturbance: Secondary | ICD-10-CM | POA: Diagnosis not present

## 2016-12-25 DIAGNOSIS — G4089 Other seizures: Secondary | ICD-10-CM | POA: Diagnosis not present

## 2016-12-25 DIAGNOSIS — C712 Malignant neoplasm of temporal lobe: Secondary | ICD-10-CM | POA: Diagnosis not present

## 2016-12-25 DIAGNOSIS — D6959 Other secondary thrombocytopenia: Secondary | ICD-10-CM | POA: Diagnosis not present

## 2016-12-26 DIAGNOSIS — F332 Major depressive disorder, recurrent severe without psychotic features: Secondary | ICD-10-CM | POA: Diagnosis not present

## 2016-12-29 DIAGNOSIS — E039 Hypothyroidism, unspecified: Secondary | ICD-10-CM | POA: Diagnosis not present

## 2016-12-29 DIAGNOSIS — I82403 Acute embolism and thrombosis of unspecified deep veins of lower extremity, bilateral: Secondary | ICD-10-CM | POA: Diagnosis not present

## 2016-12-29 DIAGNOSIS — F0390 Unspecified dementia without behavioral disturbance: Secondary | ICD-10-CM | POA: Diagnosis not present

## 2016-12-29 DIAGNOSIS — C712 Malignant neoplasm of temporal lobe: Secondary | ICD-10-CM | POA: Diagnosis not present

## 2016-12-29 DIAGNOSIS — D6959 Other secondary thrombocytopenia: Secondary | ICD-10-CM | POA: Diagnosis not present

## 2016-12-29 DIAGNOSIS — G4089 Other seizures: Secondary | ICD-10-CM | POA: Diagnosis not present

## 2016-12-30 DIAGNOSIS — D6959 Other secondary thrombocytopenia: Secondary | ICD-10-CM | POA: Diagnosis not present

## 2016-12-30 DIAGNOSIS — E039 Hypothyroidism, unspecified: Secondary | ICD-10-CM | POA: Diagnosis not present

## 2016-12-30 DIAGNOSIS — F0390 Unspecified dementia without behavioral disturbance: Secondary | ICD-10-CM | POA: Diagnosis not present

## 2016-12-30 DIAGNOSIS — C712 Malignant neoplasm of temporal lobe: Secondary | ICD-10-CM | POA: Diagnosis not present

## 2016-12-30 DIAGNOSIS — G4089 Other seizures: Secondary | ICD-10-CM | POA: Diagnosis not present

## 2016-12-30 DIAGNOSIS — I82403 Acute embolism and thrombosis of unspecified deep veins of lower extremity, bilateral: Secondary | ICD-10-CM | POA: Diagnosis not present

## 2016-12-31 DIAGNOSIS — I82403 Acute embolism and thrombosis of unspecified deep veins of lower extremity, bilateral: Secondary | ICD-10-CM | POA: Diagnosis not present

## 2016-12-31 DIAGNOSIS — C712 Malignant neoplasm of temporal lobe: Secondary | ICD-10-CM | POA: Diagnosis not present

## 2016-12-31 DIAGNOSIS — F0390 Unspecified dementia without behavioral disturbance: Secondary | ICD-10-CM | POA: Diagnosis not present

## 2016-12-31 DIAGNOSIS — D6959 Other secondary thrombocytopenia: Secondary | ICD-10-CM | POA: Diagnosis not present

## 2016-12-31 DIAGNOSIS — E039 Hypothyroidism, unspecified: Secondary | ICD-10-CM | POA: Diagnosis not present

## 2016-12-31 DIAGNOSIS — G4089 Other seizures: Secondary | ICD-10-CM | POA: Diagnosis not present

## 2017-01-01 DIAGNOSIS — D6959 Other secondary thrombocytopenia: Secondary | ICD-10-CM | POA: Diagnosis not present

## 2017-01-01 DIAGNOSIS — I82403 Acute embolism and thrombosis of unspecified deep veins of lower extremity, bilateral: Secondary | ICD-10-CM | POA: Diagnosis not present

## 2017-01-01 DIAGNOSIS — E039 Hypothyroidism, unspecified: Secondary | ICD-10-CM | POA: Diagnosis not present

## 2017-01-01 DIAGNOSIS — F0390 Unspecified dementia without behavioral disturbance: Secondary | ICD-10-CM | POA: Diagnosis not present

## 2017-01-01 DIAGNOSIS — G4089 Other seizures: Secondary | ICD-10-CM | POA: Diagnosis not present

## 2017-01-01 DIAGNOSIS — C712 Malignant neoplasm of temporal lobe: Secondary | ICD-10-CM | POA: Diagnosis not present

## 2017-01-02 DIAGNOSIS — F332 Major depressive disorder, recurrent severe without psychotic features: Secondary | ICD-10-CM | POA: Diagnosis not present

## 2017-01-03 DIAGNOSIS — E039 Hypothyroidism, unspecified: Secondary | ICD-10-CM | POA: Diagnosis not present

## 2017-01-03 DIAGNOSIS — I82403 Acute embolism and thrombosis of unspecified deep veins of lower extremity, bilateral: Secondary | ICD-10-CM | POA: Diagnosis not present

## 2017-01-03 DIAGNOSIS — D6959 Other secondary thrombocytopenia: Secondary | ICD-10-CM | POA: Diagnosis not present

## 2017-01-03 DIAGNOSIS — C712 Malignant neoplasm of temporal lobe: Secondary | ICD-10-CM | POA: Diagnosis not present

## 2017-01-03 DIAGNOSIS — F0390 Unspecified dementia without behavioral disturbance: Secondary | ICD-10-CM | POA: Diagnosis not present

## 2017-01-03 DIAGNOSIS — G4089 Other seizures: Secondary | ICD-10-CM | POA: Diagnosis not present

## 2017-01-05 DIAGNOSIS — F0390 Unspecified dementia without behavioral disturbance: Secondary | ICD-10-CM | POA: Diagnosis not present

## 2017-01-05 DIAGNOSIS — G4089 Other seizures: Secondary | ICD-10-CM | POA: Diagnosis not present

## 2017-01-05 DIAGNOSIS — I82403 Acute embolism and thrombosis of unspecified deep veins of lower extremity, bilateral: Secondary | ICD-10-CM | POA: Diagnosis not present

## 2017-01-05 DIAGNOSIS — C712 Malignant neoplasm of temporal lobe: Secondary | ICD-10-CM | POA: Diagnosis not present

## 2017-01-05 DIAGNOSIS — E039 Hypothyroidism, unspecified: Secondary | ICD-10-CM | POA: Diagnosis not present

## 2017-01-05 DIAGNOSIS — D6959 Other secondary thrombocytopenia: Secondary | ICD-10-CM | POA: Diagnosis not present

## 2017-01-06 DIAGNOSIS — G4089 Other seizures: Secondary | ICD-10-CM | POA: Diagnosis not present

## 2017-01-06 DIAGNOSIS — F0390 Unspecified dementia without behavioral disturbance: Secondary | ICD-10-CM | POA: Diagnosis not present

## 2017-01-06 DIAGNOSIS — E039 Hypothyroidism, unspecified: Secondary | ICD-10-CM | POA: Diagnosis not present

## 2017-01-06 DIAGNOSIS — D6959 Other secondary thrombocytopenia: Secondary | ICD-10-CM | POA: Diagnosis not present

## 2017-01-06 DIAGNOSIS — I82403 Acute embolism and thrombosis of unspecified deep veins of lower extremity, bilateral: Secondary | ICD-10-CM | POA: Diagnosis not present

## 2017-01-06 DIAGNOSIS — C712 Malignant neoplasm of temporal lobe: Secondary | ICD-10-CM | POA: Diagnosis not present

## 2017-01-08 DIAGNOSIS — F0390 Unspecified dementia without behavioral disturbance: Secondary | ICD-10-CM | POA: Diagnosis not present

## 2017-01-08 DIAGNOSIS — G4089 Other seizures: Secondary | ICD-10-CM | POA: Diagnosis not present

## 2017-01-08 DIAGNOSIS — I82403 Acute embolism and thrombosis of unspecified deep veins of lower extremity, bilateral: Secondary | ICD-10-CM | POA: Diagnosis not present

## 2017-01-08 DIAGNOSIS — C712 Malignant neoplasm of temporal lobe: Secondary | ICD-10-CM | POA: Diagnosis not present

## 2017-01-08 DIAGNOSIS — D6959 Other secondary thrombocytopenia: Secondary | ICD-10-CM | POA: Diagnosis not present

## 2017-01-08 DIAGNOSIS — E039 Hypothyroidism, unspecified: Secondary | ICD-10-CM | POA: Diagnosis not present

## 2017-01-09 DIAGNOSIS — C712 Malignant neoplasm of temporal lobe: Secondary | ICD-10-CM | POA: Diagnosis not present

## 2017-01-09 DIAGNOSIS — F0390 Unspecified dementia without behavioral disturbance: Secondary | ICD-10-CM | POA: Diagnosis not present

## 2017-01-09 DIAGNOSIS — E039 Hypothyroidism, unspecified: Secondary | ICD-10-CM | POA: Diagnosis not present

## 2017-01-09 DIAGNOSIS — D6959 Other secondary thrombocytopenia: Secondary | ICD-10-CM | POA: Diagnosis not present

## 2017-01-09 DIAGNOSIS — G4089 Other seizures: Secondary | ICD-10-CM | POA: Diagnosis not present

## 2017-01-09 DIAGNOSIS — I82403 Acute embolism and thrombosis of unspecified deep veins of lower extremity, bilateral: Secondary | ICD-10-CM | POA: Diagnosis not present

## 2017-01-12 DIAGNOSIS — F0390 Unspecified dementia without behavioral disturbance: Secondary | ICD-10-CM | POA: Diagnosis not present

## 2017-01-12 DIAGNOSIS — C712 Malignant neoplasm of temporal lobe: Secondary | ICD-10-CM | POA: Diagnosis not present

## 2017-01-12 DIAGNOSIS — E039 Hypothyroidism, unspecified: Secondary | ICD-10-CM | POA: Diagnosis not present

## 2017-01-12 DIAGNOSIS — D6959 Other secondary thrombocytopenia: Secondary | ICD-10-CM | POA: Diagnosis not present

## 2017-01-12 DIAGNOSIS — I82403 Acute embolism and thrombosis of unspecified deep veins of lower extremity, bilateral: Secondary | ICD-10-CM | POA: Diagnosis not present

## 2017-01-12 DIAGNOSIS — G4089 Other seizures: Secondary | ICD-10-CM | POA: Diagnosis not present

## 2017-01-13 DIAGNOSIS — F0390 Unspecified dementia without behavioral disturbance: Secondary | ICD-10-CM | POA: Diagnosis not present

## 2017-01-13 DIAGNOSIS — C712 Malignant neoplasm of temporal lobe: Secondary | ICD-10-CM | POA: Diagnosis not present

## 2017-01-13 DIAGNOSIS — I82403 Acute embolism and thrombosis of unspecified deep veins of lower extremity, bilateral: Secondary | ICD-10-CM | POA: Diagnosis not present

## 2017-01-13 DIAGNOSIS — E039 Hypothyroidism, unspecified: Secondary | ICD-10-CM | POA: Diagnosis not present

## 2017-01-13 DIAGNOSIS — D6959 Other secondary thrombocytopenia: Secondary | ICD-10-CM | POA: Diagnosis not present

## 2017-01-13 DIAGNOSIS — G4089 Other seizures: Secondary | ICD-10-CM | POA: Diagnosis not present

## 2017-01-15 DIAGNOSIS — C712 Malignant neoplasm of temporal lobe: Secondary | ICD-10-CM | POA: Diagnosis not present

## 2017-01-15 DIAGNOSIS — I82403 Acute embolism and thrombosis of unspecified deep veins of lower extremity, bilateral: Secondary | ICD-10-CM | POA: Diagnosis not present

## 2017-01-15 DIAGNOSIS — F0390 Unspecified dementia without behavioral disturbance: Secondary | ICD-10-CM | POA: Diagnosis not present

## 2017-01-15 DIAGNOSIS — G4089 Other seizures: Secondary | ICD-10-CM | POA: Diagnosis not present

## 2017-01-15 DIAGNOSIS — E039 Hypothyroidism, unspecified: Secondary | ICD-10-CM | POA: Diagnosis not present

## 2017-01-15 DIAGNOSIS — D6959 Other secondary thrombocytopenia: Secondary | ICD-10-CM | POA: Diagnosis not present

## 2017-01-16 DIAGNOSIS — I82403 Acute embolism and thrombosis of unspecified deep veins of lower extremity, bilateral: Secondary | ICD-10-CM | POA: Diagnosis not present

## 2017-01-16 DIAGNOSIS — E039 Hypothyroidism, unspecified: Secondary | ICD-10-CM | POA: Diagnosis not present

## 2017-01-16 DIAGNOSIS — F332 Major depressive disorder, recurrent severe without psychotic features: Secondary | ICD-10-CM | POA: Diagnosis not present

## 2017-01-16 DIAGNOSIS — C712 Malignant neoplasm of temporal lobe: Secondary | ICD-10-CM | POA: Diagnosis not present

## 2017-01-16 DIAGNOSIS — F0390 Unspecified dementia without behavioral disturbance: Secondary | ICD-10-CM | POA: Diagnosis not present

## 2017-01-16 DIAGNOSIS — G4089 Other seizures: Secondary | ICD-10-CM | POA: Diagnosis not present

## 2017-01-16 DIAGNOSIS — D6959 Other secondary thrombocytopenia: Secondary | ICD-10-CM | POA: Diagnosis not present

## 2017-01-18 DIAGNOSIS — G4089 Other seizures: Secondary | ICD-10-CM | POA: Diagnosis not present

## 2017-01-18 DIAGNOSIS — Z923 Personal history of irradiation: Secondary | ICD-10-CM | POA: Diagnosis not present

## 2017-01-18 DIAGNOSIS — F0390 Unspecified dementia without behavioral disturbance: Secondary | ICD-10-CM | POA: Diagnosis not present

## 2017-01-18 DIAGNOSIS — R1310 Dysphagia, unspecified: Secondary | ICD-10-CM | POA: Diagnosis not present

## 2017-01-18 DIAGNOSIS — D649 Anemia, unspecified: Secondary | ICD-10-CM | POA: Diagnosis not present

## 2017-01-18 DIAGNOSIS — Z6822 Body mass index (BMI) 22.0-22.9, adult: Secondary | ICD-10-CM | POA: Diagnosis not present

## 2017-01-18 DIAGNOSIS — Z8619 Personal history of other infectious and parasitic diseases: Secondary | ICD-10-CM | POA: Diagnosis not present

## 2017-01-18 DIAGNOSIS — Z95828 Presence of other vascular implants and grafts: Secondary | ICD-10-CM | POA: Diagnosis not present

## 2017-01-18 DIAGNOSIS — Z741 Need for assistance with personal care: Secondary | ICD-10-CM | POA: Diagnosis not present

## 2017-01-18 DIAGNOSIS — E039 Hypothyroidism, unspecified: Secondary | ICD-10-CM | POA: Diagnosis not present

## 2017-01-18 DIAGNOSIS — I82403 Acute embolism and thrombosis of unspecified deep veins of lower extremity, bilateral: Secondary | ICD-10-CM | POA: Diagnosis not present

## 2017-01-18 DIAGNOSIS — D6959 Other secondary thrombocytopenia: Secondary | ICD-10-CM | POA: Diagnosis not present

## 2017-01-18 DIAGNOSIS — Z9221 Personal history of antineoplastic chemotherapy: Secondary | ICD-10-CM | POA: Diagnosis not present

## 2017-01-18 DIAGNOSIS — C712 Malignant neoplasm of temporal lobe: Secondary | ICD-10-CM | POA: Diagnosis not present

## 2017-01-19 DIAGNOSIS — D6959 Other secondary thrombocytopenia: Secondary | ICD-10-CM | POA: Diagnosis not present

## 2017-01-19 DIAGNOSIS — G4089 Other seizures: Secondary | ICD-10-CM | POA: Diagnosis not present

## 2017-01-19 DIAGNOSIS — E039 Hypothyroidism, unspecified: Secondary | ICD-10-CM | POA: Diagnosis not present

## 2017-01-19 DIAGNOSIS — C712 Malignant neoplasm of temporal lobe: Secondary | ICD-10-CM | POA: Diagnosis not present

## 2017-01-19 DIAGNOSIS — I82403 Acute embolism and thrombosis of unspecified deep veins of lower extremity, bilateral: Secondary | ICD-10-CM | POA: Diagnosis not present

## 2017-01-19 DIAGNOSIS — F0390 Unspecified dementia without behavioral disturbance: Secondary | ICD-10-CM | POA: Diagnosis not present

## 2017-01-20 DIAGNOSIS — F0390 Unspecified dementia without behavioral disturbance: Secondary | ICD-10-CM | POA: Diagnosis not present

## 2017-01-20 DIAGNOSIS — G4089 Other seizures: Secondary | ICD-10-CM | POA: Diagnosis not present

## 2017-01-20 DIAGNOSIS — I82403 Acute embolism and thrombosis of unspecified deep veins of lower extremity, bilateral: Secondary | ICD-10-CM | POA: Diagnosis not present

## 2017-01-20 DIAGNOSIS — C712 Malignant neoplasm of temporal lobe: Secondary | ICD-10-CM | POA: Diagnosis not present

## 2017-01-20 DIAGNOSIS — D6959 Other secondary thrombocytopenia: Secondary | ICD-10-CM | POA: Diagnosis not present

## 2017-01-20 DIAGNOSIS — E039 Hypothyroidism, unspecified: Secondary | ICD-10-CM | POA: Diagnosis not present

## 2017-01-22 DIAGNOSIS — E039 Hypothyroidism, unspecified: Secondary | ICD-10-CM | POA: Diagnosis not present

## 2017-01-22 DIAGNOSIS — G4089 Other seizures: Secondary | ICD-10-CM | POA: Diagnosis not present

## 2017-01-22 DIAGNOSIS — I82403 Acute embolism and thrombosis of unspecified deep veins of lower extremity, bilateral: Secondary | ICD-10-CM | POA: Diagnosis not present

## 2017-01-22 DIAGNOSIS — F0390 Unspecified dementia without behavioral disturbance: Secondary | ICD-10-CM | POA: Diagnosis not present

## 2017-01-22 DIAGNOSIS — C712 Malignant neoplasm of temporal lobe: Secondary | ICD-10-CM | POA: Diagnosis not present

## 2017-01-22 DIAGNOSIS — D6959 Other secondary thrombocytopenia: Secondary | ICD-10-CM | POA: Diagnosis not present

## 2017-01-23 DIAGNOSIS — F332 Major depressive disorder, recurrent severe without psychotic features: Secondary | ICD-10-CM | POA: Diagnosis not present

## 2017-01-27 DIAGNOSIS — F0390 Unspecified dementia without behavioral disturbance: Secondary | ICD-10-CM | POA: Diagnosis not present

## 2017-01-27 DIAGNOSIS — I82403 Acute embolism and thrombosis of unspecified deep veins of lower extremity, bilateral: Secondary | ICD-10-CM | POA: Diagnosis not present

## 2017-01-27 DIAGNOSIS — C712 Malignant neoplasm of temporal lobe: Secondary | ICD-10-CM | POA: Diagnosis not present

## 2017-01-27 DIAGNOSIS — G4089 Other seizures: Secondary | ICD-10-CM | POA: Diagnosis not present

## 2017-01-27 DIAGNOSIS — D6959 Other secondary thrombocytopenia: Secondary | ICD-10-CM | POA: Diagnosis not present

## 2017-01-27 DIAGNOSIS — E039 Hypothyroidism, unspecified: Secondary | ICD-10-CM | POA: Diagnosis not present

## 2017-01-28 DIAGNOSIS — D6959 Other secondary thrombocytopenia: Secondary | ICD-10-CM | POA: Diagnosis not present

## 2017-01-28 DIAGNOSIS — I82403 Acute embolism and thrombosis of unspecified deep veins of lower extremity, bilateral: Secondary | ICD-10-CM | POA: Diagnosis not present

## 2017-01-28 DIAGNOSIS — G4089 Other seizures: Secondary | ICD-10-CM | POA: Diagnosis not present

## 2017-01-28 DIAGNOSIS — E039 Hypothyroidism, unspecified: Secondary | ICD-10-CM | POA: Diagnosis not present

## 2017-01-28 DIAGNOSIS — C712 Malignant neoplasm of temporal lobe: Secondary | ICD-10-CM | POA: Diagnosis not present

## 2017-01-28 DIAGNOSIS — F0390 Unspecified dementia without behavioral disturbance: Secondary | ICD-10-CM | POA: Diagnosis not present

## 2017-01-29 DIAGNOSIS — G4089 Other seizures: Secondary | ICD-10-CM | POA: Diagnosis not present

## 2017-01-29 DIAGNOSIS — I82403 Acute embolism and thrombosis of unspecified deep veins of lower extremity, bilateral: Secondary | ICD-10-CM | POA: Diagnosis not present

## 2017-01-29 DIAGNOSIS — F0390 Unspecified dementia without behavioral disturbance: Secondary | ICD-10-CM | POA: Diagnosis not present

## 2017-01-29 DIAGNOSIS — D6959 Other secondary thrombocytopenia: Secondary | ICD-10-CM | POA: Diagnosis not present

## 2017-01-29 DIAGNOSIS — E039 Hypothyroidism, unspecified: Secondary | ICD-10-CM | POA: Diagnosis not present

## 2017-01-29 DIAGNOSIS — C712 Malignant neoplasm of temporal lobe: Secondary | ICD-10-CM | POA: Diagnosis not present

## 2017-01-30 DIAGNOSIS — D6959 Other secondary thrombocytopenia: Secondary | ICD-10-CM | POA: Diagnosis not present

## 2017-01-30 DIAGNOSIS — I82403 Acute embolism and thrombosis of unspecified deep veins of lower extremity, bilateral: Secondary | ICD-10-CM | POA: Diagnosis not present

## 2017-01-30 DIAGNOSIS — G4089 Other seizures: Secondary | ICD-10-CM | POA: Diagnosis not present

## 2017-01-30 DIAGNOSIS — F332 Major depressive disorder, recurrent severe without psychotic features: Secondary | ICD-10-CM | POA: Diagnosis not present

## 2017-01-30 DIAGNOSIS — C712 Malignant neoplasm of temporal lobe: Secondary | ICD-10-CM | POA: Diagnosis not present

## 2017-01-30 DIAGNOSIS — F0390 Unspecified dementia without behavioral disturbance: Secondary | ICD-10-CM | POA: Diagnosis not present

## 2017-01-30 DIAGNOSIS — E039 Hypothyroidism, unspecified: Secondary | ICD-10-CM | POA: Diagnosis not present

## 2017-02-03 DIAGNOSIS — G4089 Other seizures: Secondary | ICD-10-CM | POA: Diagnosis not present

## 2017-02-03 DIAGNOSIS — C712 Malignant neoplasm of temporal lobe: Secondary | ICD-10-CM | POA: Diagnosis not present

## 2017-02-03 DIAGNOSIS — I82403 Acute embolism and thrombosis of unspecified deep veins of lower extremity, bilateral: Secondary | ICD-10-CM | POA: Diagnosis not present

## 2017-02-03 DIAGNOSIS — E039 Hypothyroidism, unspecified: Secondary | ICD-10-CM | POA: Diagnosis not present

## 2017-02-03 DIAGNOSIS — D6959 Other secondary thrombocytopenia: Secondary | ICD-10-CM | POA: Diagnosis not present

## 2017-02-03 DIAGNOSIS — F0390 Unspecified dementia without behavioral disturbance: Secondary | ICD-10-CM | POA: Diagnosis not present

## 2017-02-05 DIAGNOSIS — I82403 Acute embolism and thrombosis of unspecified deep veins of lower extremity, bilateral: Secondary | ICD-10-CM | POA: Diagnosis not present

## 2017-02-05 DIAGNOSIS — G4089 Other seizures: Secondary | ICD-10-CM | POA: Diagnosis not present

## 2017-02-05 DIAGNOSIS — D6959 Other secondary thrombocytopenia: Secondary | ICD-10-CM | POA: Diagnosis not present

## 2017-02-05 DIAGNOSIS — E039 Hypothyroidism, unspecified: Secondary | ICD-10-CM | POA: Diagnosis not present

## 2017-02-05 DIAGNOSIS — C712 Malignant neoplasm of temporal lobe: Secondary | ICD-10-CM | POA: Diagnosis not present

## 2017-02-05 DIAGNOSIS — F0390 Unspecified dementia without behavioral disturbance: Secondary | ICD-10-CM | POA: Diagnosis not present

## 2017-02-06 DIAGNOSIS — E039 Hypothyroidism, unspecified: Secondary | ICD-10-CM | POA: Diagnosis not present

## 2017-02-06 DIAGNOSIS — C712 Malignant neoplasm of temporal lobe: Secondary | ICD-10-CM | POA: Diagnosis not present

## 2017-02-06 DIAGNOSIS — G4089 Other seizures: Secondary | ICD-10-CM | POA: Diagnosis not present

## 2017-02-06 DIAGNOSIS — D6959 Other secondary thrombocytopenia: Secondary | ICD-10-CM | POA: Diagnosis not present

## 2017-02-06 DIAGNOSIS — F0390 Unspecified dementia without behavioral disturbance: Secondary | ICD-10-CM | POA: Diagnosis not present

## 2017-02-06 DIAGNOSIS — I82403 Acute embolism and thrombosis of unspecified deep veins of lower extremity, bilateral: Secondary | ICD-10-CM | POA: Diagnosis not present

## 2017-02-09 DIAGNOSIS — G4089 Other seizures: Secondary | ICD-10-CM | POA: Diagnosis not present

## 2017-02-09 DIAGNOSIS — E039 Hypothyroidism, unspecified: Secondary | ICD-10-CM | POA: Diagnosis not present

## 2017-02-09 DIAGNOSIS — D6959 Other secondary thrombocytopenia: Secondary | ICD-10-CM | POA: Diagnosis not present

## 2017-02-09 DIAGNOSIS — F0390 Unspecified dementia without behavioral disturbance: Secondary | ICD-10-CM | POA: Diagnosis not present

## 2017-02-09 DIAGNOSIS — I82403 Acute embolism and thrombosis of unspecified deep veins of lower extremity, bilateral: Secondary | ICD-10-CM | POA: Diagnosis not present

## 2017-02-09 DIAGNOSIS — C712 Malignant neoplasm of temporal lobe: Secondary | ICD-10-CM | POA: Diagnosis not present

## 2017-02-10 DIAGNOSIS — D6959 Other secondary thrombocytopenia: Secondary | ICD-10-CM | POA: Diagnosis not present

## 2017-02-10 DIAGNOSIS — F0390 Unspecified dementia without behavioral disturbance: Secondary | ICD-10-CM | POA: Diagnosis not present

## 2017-02-10 DIAGNOSIS — E039 Hypothyroidism, unspecified: Secondary | ICD-10-CM | POA: Diagnosis not present

## 2017-02-10 DIAGNOSIS — I82403 Acute embolism and thrombosis of unspecified deep veins of lower extremity, bilateral: Secondary | ICD-10-CM | POA: Diagnosis not present

## 2017-02-10 DIAGNOSIS — C712 Malignant neoplasm of temporal lobe: Secondary | ICD-10-CM | POA: Diagnosis not present

## 2017-02-10 DIAGNOSIS — G4089 Other seizures: Secondary | ICD-10-CM | POA: Diagnosis not present

## 2017-02-12 DIAGNOSIS — F0390 Unspecified dementia without behavioral disturbance: Secondary | ICD-10-CM | POA: Diagnosis not present

## 2017-02-12 DIAGNOSIS — D6959 Other secondary thrombocytopenia: Secondary | ICD-10-CM | POA: Diagnosis not present

## 2017-02-12 DIAGNOSIS — G4089 Other seizures: Secondary | ICD-10-CM | POA: Diagnosis not present

## 2017-02-12 DIAGNOSIS — C712 Malignant neoplasm of temporal lobe: Secondary | ICD-10-CM | POA: Diagnosis not present

## 2017-02-12 DIAGNOSIS — I82403 Acute embolism and thrombosis of unspecified deep veins of lower extremity, bilateral: Secondary | ICD-10-CM | POA: Diagnosis not present

## 2017-02-12 DIAGNOSIS — E039 Hypothyroidism, unspecified: Secondary | ICD-10-CM | POA: Diagnosis not present

## 2017-02-13 DIAGNOSIS — F332 Major depressive disorder, recurrent severe without psychotic features: Secondary | ICD-10-CM | POA: Diagnosis not present

## 2017-02-15 DIAGNOSIS — F0390 Unspecified dementia without behavioral disturbance: Secondary | ICD-10-CM | POA: Diagnosis not present

## 2017-02-15 DIAGNOSIS — I82403 Acute embolism and thrombosis of unspecified deep veins of lower extremity, bilateral: Secondary | ICD-10-CM | POA: Diagnosis not present

## 2017-02-15 DIAGNOSIS — D6959 Other secondary thrombocytopenia: Secondary | ICD-10-CM | POA: Diagnosis not present

## 2017-02-15 DIAGNOSIS — C712 Malignant neoplasm of temporal lobe: Secondary | ICD-10-CM | POA: Diagnosis not present

## 2017-02-15 DIAGNOSIS — G4089 Other seizures: Secondary | ICD-10-CM | POA: Diagnosis not present

## 2017-02-15 DIAGNOSIS — E039 Hypothyroidism, unspecified: Secondary | ICD-10-CM | POA: Diagnosis not present

## 2017-02-17 DIAGNOSIS — D6959 Other secondary thrombocytopenia: Secondary | ICD-10-CM | POA: Diagnosis not present

## 2017-02-17 DIAGNOSIS — C712 Malignant neoplasm of temporal lobe: Secondary | ICD-10-CM | POA: Diagnosis not present

## 2017-02-17 DIAGNOSIS — G4089 Other seizures: Secondary | ICD-10-CM | POA: Diagnosis not present

## 2017-02-17 DIAGNOSIS — F0390 Unspecified dementia without behavioral disturbance: Secondary | ICD-10-CM | POA: Diagnosis not present

## 2017-02-17 DIAGNOSIS — I82403 Acute embolism and thrombosis of unspecified deep veins of lower extremity, bilateral: Secondary | ICD-10-CM | POA: Diagnosis not present

## 2017-02-17 DIAGNOSIS — E039 Hypothyroidism, unspecified: Secondary | ICD-10-CM | POA: Diagnosis not present

## 2017-02-18 DIAGNOSIS — Z741 Need for assistance with personal care: Secondary | ICD-10-CM | POA: Diagnosis not present

## 2017-02-18 DIAGNOSIS — Z923 Personal history of irradiation: Secondary | ICD-10-CM | POA: Diagnosis not present

## 2017-02-18 DIAGNOSIS — C712 Malignant neoplasm of temporal lobe: Secondary | ICD-10-CM | POA: Diagnosis not present

## 2017-02-18 DIAGNOSIS — Z6822 Body mass index (BMI) 22.0-22.9, adult: Secondary | ICD-10-CM | POA: Diagnosis not present

## 2017-02-18 DIAGNOSIS — Z9221 Personal history of antineoplastic chemotherapy: Secondary | ICD-10-CM | POA: Diagnosis not present

## 2017-02-18 DIAGNOSIS — R1310 Dysphagia, unspecified: Secondary | ICD-10-CM | POA: Diagnosis not present

## 2017-02-18 DIAGNOSIS — E039 Hypothyroidism, unspecified: Secondary | ICD-10-CM | POA: Diagnosis not present

## 2017-02-18 DIAGNOSIS — I82403 Acute embolism and thrombosis of unspecified deep veins of lower extremity, bilateral: Secondary | ICD-10-CM | POA: Diagnosis not present

## 2017-02-18 DIAGNOSIS — F0390 Unspecified dementia without behavioral disturbance: Secondary | ICD-10-CM | POA: Diagnosis not present

## 2017-02-18 DIAGNOSIS — Z95828 Presence of other vascular implants and grafts: Secondary | ICD-10-CM | POA: Diagnosis not present

## 2017-02-18 DIAGNOSIS — D6959 Other secondary thrombocytopenia: Secondary | ICD-10-CM | POA: Diagnosis not present

## 2017-02-18 DIAGNOSIS — G4089 Other seizures: Secondary | ICD-10-CM | POA: Diagnosis not present

## 2017-02-18 DIAGNOSIS — Z8619 Personal history of other infectious and parasitic diseases: Secondary | ICD-10-CM | POA: Diagnosis not present

## 2017-02-18 DIAGNOSIS — D649 Anemia, unspecified: Secondary | ICD-10-CM | POA: Diagnosis not present

## 2017-02-19 DIAGNOSIS — C712 Malignant neoplasm of temporal lobe: Secondary | ICD-10-CM | POA: Diagnosis not present

## 2017-02-19 DIAGNOSIS — E039 Hypothyroidism, unspecified: Secondary | ICD-10-CM | POA: Diagnosis not present

## 2017-02-19 DIAGNOSIS — G4089 Other seizures: Secondary | ICD-10-CM | POA: Diagnosis not present

## 2017-02-19 DIAGNOSIS — F0390 Unspecified dementia without behavioral disturbance: Secondary | ICD-10-CM | POA: Diagnosis not present

## 2017-02-19 DIAGNOSIS — I82403 Acute embolism and thrombosis of unspecified deep veins of lower extremity, bilateral: Secondary | ICD-10-CM | POA: Diagnosis not present

## 2017-02-19 DIAGNOSIS — D6959 Other secondary thrombocytopenia: Secondary | ICD-10-CM | POA: Diagnosis not present

## 2017-02-20 DIAGNOSIS — C712 Malignant neoplasm of temporal lobe: Secondary | ICD-10-CM | POA: Diagnosis not present

## 2017-02-20 DIAGNOSIS — E039 Hypothyroidism, unspecified: Secondary | ICD-10-CM | POA: Diagnosis not present

## 2017-02-20 DIAGNOSIS — I82403 Acute embolism and thrombosis of unspecified deep veins of lower extremity, bilateral: Secondary | ICD-10-CM | POA: Diagnosis not present

## 2017-02-20 DIAGNOSIS — D6959 Other secondary thrombocytopenia: Secondary | ICD-10-CM | POA: Diagnosis not present

## 2017-02-20 DIAGNOSIS — G4089 Other seizures: Secondary | ICD-10-CM | POA: Diagnosis not present

## 2017-02-20 DIAGNOSIS — F0390 Unspecified dementia without behavioral disturbance: Secondary | ICD-10-CM | POA: Diagnosis not present

## 2017-02-20 DIAGNOSIS — F332 Major depressive disorder, recurrent severe without psychotic features: Secondary | ICD-10-CM | POA: Diagnosis not present

## 2017-02-24 DIAGNOSIS — E039 Hypothyroidism, unspecified: Secondary | ICD-10-CM | POA: Diagnosis not present

## 2017-02-24 DIAGNOSIS — F0390 Unspecified dementia without behavioral disturbance: Secondary | ICD-10-CM | POA: Diagnosis not present

## 2017-02-24 DIAGNOSIS — G4089 Other seizures: Secondary | ICD-10-CM | POA: Diagnosis not present

## 2017-02-24 DIAGNOSIS — D6959 Other secondary thrombocytopenia: Secondary | ICD-10-CM | POA: Diagnosis not present

## 2017-02-24 DIAGNOSIS — I82403 Acute embolism and thrombosis of unspecified deep veins of lower extremity, bilateral: Secondary | ICD-10-CM | POA: Diagnosis not present

## 2017-02-24 DIAGNOSIS — C712 Malignant neoplasm of temporal lobe: Secondary | ICD-10-CM | POA: Diagnosis not present

## 2017-02-25 DIAGNOSIS — E039 Hypothyroidism, unspecified: Secondary | ICD-10-CM | POA: Diagnosis not present

## 2017-02-25 DIAGNOSIS — F0390 Unspecified dementia without behavioral disturbance: Secondary | ICD-10-CM | POA: Diagnosis not present

## 2017-02-25 DIAGNOSIS — D6959 Other secondary thrombocytopenia: Secondary | ICD-10-CM | POA: Diagnosis not present

## 2017-02-25 DIAGNOSIS — C712 Malignant neoplasm of temporal lobe: Secondary | ICD-10-CM | POA: Diagnosis not present

## 2017-02-25 DIAGNOSIS — I82403 Acute embolism and thrombosis of unspecified deep veins of lower extremity, bilateral: Secondary | ICD-10-CM | POA: Diagnosis not present

## 2017-02-25 DIAGNOSIS — G4089 Other seizures: Secondary | ICD-10-CM | POA: Diagnosis not present

## 2017-02-26 DIAGNOSIS — E039 Hypothyroidism, unspecified: Secondary | ICD-10-CM | POA: Diagnosis not present

## 2017-02-26 DIAGNOSIS — I82403 Acute embolism and thrombosis of unspecified deep veins of lower extremity, bilateral: Secondary | ICD-10-CM | POA: Diagnosis not present

## 2017-02-26 DIAGNOSIS — G4089 Other seizures: Secondary | ICD-10-CM | POA: Diagnosis not present

## 2017-02-26 DIAGNOSIS — F0390 Unspecified dementia without behavioral disturbance: Secondary | ICD-10-CM | POA: Diagnosis not present

## 2017-02-26 DIAGNOSIS — D6959 Other secondary thrombocytopenia: Secondary | ICD-10-CM | POA: Diagnosis not present

## 2017-02-26 DIAGNOSIS — C712 Malignant neoplasm of temporal lobe: Secondary | ICD-10-CM | POA: Diagnosis not present

## 2017-02-27 DIAGNOSIS — F332 Major depressive disorder, recurrent severe without psychotic features: Secondary | ICD-10-CM | POA: Diagnosis not present

## 2017-03-02 DIAGNOSIS — D6959 Other secondary thrombocytopenia: Secondary | ICD-10-CM | POA: Diagnosis not present

## 2017-03-02 DIAGNOSIS — C712 Malignant neoplasm of temporal lobe: Secondary | ICD-10-CM | POA: Diagnosis not present

## 2017-03-02 DIAGNOSIS — F0390 Unspecified dementia without behavioral disturbance: Secondary | ICD-10-CM | POA: Diagnosis not present

## 2017-03-02 DIAGNOSIS — I82403 Acute embolism and thrombosis of unspecified deep veins of lower extremity, bilateral: Secondary | ICD-10-CM | POA: Diagnosis not present

## 2017-03-02 DIAGNOSIS — E039 Hypothyroidism, unspecified: Secondary | ICD-10-CM | POA: Diagnosis not present

## 2017-03-02 DIAGNOSIS — G4089 Other seizures: Secondary | ICD-10-CM | POA: Diagnosis not present

## 2017-03-03 DIAGNOSIS — I82403 Acute embolism and thrombosis of unspecified deep veins of lower extremity, bilateral: Secondary | ICD-10-CM | POA: Diagnosis not present

## 2017-03-03 DIAGNOSIS — C712 Malignant neoplasm of temporal lobe: Secondary | ICD-10-CM | POA: Diagnosis not present

## 2017-03-03 DIAGNOSIS — E039 Hypothyroidism, unspecified: Secondary | ICD-10-CM | POA: Diagnosis not present

## 2017-03-03 DIAGNOSIS — D6959 Other secondary thrombocytopenia: Secondary | ICD-10-CM | POA: Diagnosis not present

## 2017-03-03 DIAGNOSIS — G4089 Other seizures: Secondary | ICD-10-CM | POA: Diagnosis not present

## 2017-03-03 DIAGNOSIS — F0390 Unspecified dementia without behavioral disturbance: Secondary | ICD-10-CM | POA: Diagnosis not present

## 2017-03-04 DIAGNOSIS — I82403 Acute embolism and thrombosis of unspecified deep veins of lower extremity, bilateral: Secondary | ICD-10-CM | POA: Diagnosis not present

## 2017-03-04 DIAGNOSIS — F0390 Unspecified dementia without behavioral disturbance: Secondary | ICD-10-CM | POA: Diagnosis not present

## 2017-03-04 DIAGNOSIS — E039 Hypothyroidism, unspecified: Secondary | ICD-10-CM | POA: Diagnosis not present

## 2017-03-04 DIAGNOSIS — D6959 Other secondary thrombocytopenia: Secondary | ICD-10-CM | POA: Diagnosis not present

## 2017-03-04 DIAGNOSIS — C712 Malignant neoplasm of temporal lobe: Secondary | ICD-10-CM | POA: Diagnosis not present

## 2017-03-04 DIAGNOSIS — G4089 Other seizures: Secondary | ICD-10-CM | POA: Diagnosis not present

## 2017-03-05 DIAGNOSIS — E039 Hypothyroidism, unspecified: Secondary | ICD-10-CM | POA: Diagnosis not present

## 2017-03-05 DIAGNOSIS — C712 Malignant neoplasm of temporal lobe: Secondary | ICD-10-CM | POA: Diagnosis not present

## 2017-03-05 DIAGNOSIS — D6959 Other secondary thrombocytopenia: Secondary | ICD-10-CM | POA: Diagnosis not present

## 2017-03-05 DIAGNOSIS — F0390 Unspecified dementia without behavioral disturbance: Secondary | ICD-10-CM | POA: Diagnosis not present

## 2017-03-05 DIAGNOSIS — G4089 Other seizures: Secondary | ICD-10-CM | POA: Diagnosis not present

## 2017-03-05 DIAGNOSIS — I82403 Acute embolism and thrombosis of unspecified deep veins of lower extremity, bilateral: Secondary | ICD-10-CM | POA: Diagnosis not present

## 2017-03-06 DIAGNOSIS — F332 Major depressive disorder, recurrent severe without psychotic features: Secondary | ICD-10-CM | POA: Diagnosis not present

## 2017-03-10 DIAGNOSIS — C712 Malignant neoplasm of temporal lobe: Secondary | ICD-10-CM | POA: Diagnosis not present

## 2017-03-10 DIAGNOSIS — G4089 Other seizures: Secondary | ICD-10-CM | POA: Diagnosis not present

## 2017-03-10 DIAGNOSIS — E039 Hypothyroidism, unspecified: Secondary | ICD-10-CM | POA: Diagnosis not present

## 2017-03-10 DIAGNOSIS — F0390 Unspecified dementia without behavioral disturbance: Secondary | ICD-10-CM | POA: Diagnosis not present

## 2017-03-10 DIAGNOSIS — I82403 Acute embolism and thrombosis of unspecified deep veins of lower extremity, bilateral: Secondary | ICD-10-CM | POA: Diagnosis not present

## 2017-03-10 DIAGNOSIS — D6959 Other secondary thrombocytopenia: Secondary | ICD-10-CM | POA: Diagnosis not present

## 2017-03-12 DIAGNOSIS — C712 Malignant neoplasm of temporal lobe: Secondary | ICD-10-CM | POA: Diagnosis not present

## 2017-03-12 DIAGNOSIS — F0390 Unspecified dementia without behavioral disturbance: Secondary | ICD-10-CM | POA: Diagnosis not present

## 2017-03-12 DIAGNOSIS — G4089 Other seizures: Secondary | ICD-10-CM | POA: Diagnosis not present

## 2017-03-12 DIAGNOSIS — E039 Hypothyroidism, unspecified: Secondary | ICD-10-CM | POA: Diagnosis not present

## 2017-03-12 DIAGNOSIS — I82403 Acute embolism and thrombosis of unspecified deep veins of lower extremity, bilateral: Secondary | ICD-10-CM | POA: Diagnosis not present

## 2017-03-12 DIAGNOSIS — D6959 Other secondary thrombocytopenia: Secondary | ICD-10-CM | POA: Diagnosis not present

## 2017-03-13 DIAGNOSIS — F332 Major depressive disorder, recurrent severe without psychotic features: Secondary | ICD-10-CM | POA: Diagnosis not present

## 2017-03-15 DIAGNOSIS — C712 Malignant neoplasm of temporal lobe: Secondary | ICD-10-CM | POA: Diagnosis not present

## 2017-03-15 DIAGNOSIS — G4089 Other seizures: Secondary | ICD-10-CM | POA: Diagnosis not present

## 2017-03-15 DIAGNOSIS — I82403 Acute embolism and thrombosis of unspecified deep veins of lower extremity, bilateral: Secondary | ICD-10-CM | POA: Diagnosis not present

## 2017-03-15 DIAGNOSIS — F0390 Unspecified dementia without behavioral disturbance: Secondary | ICD-10-CM | POA: Diagnosis not present

## 2017-03-15 DIAGNOSIS — E039 Hypothyroidism, unspecified: Secondary | ICD-10-CM | POA: Diagnosis not present

## 2017-03-15 DIAGNOSIS — D6959 Other secondary thrombocytopenia: Secondary | ICD-10-CM | POA: Diagnosis not present

## 2017-03-17 DIAGNOSIS — G4089 Other seizures: Secondary | ICD-10-CM | POA: Diagnosis not present

## 2017-03-17 DIAGNOSIS — F0390 Unspecified dementia without behavioral disturbance: Secondary | ICD-10-CM | POA: Diagnosis not present

## 2017-03-17 DIAGNOSIS — D6959 Other secondary thrombocytopenia: Secondary | ICD-10-CM | POA: Diagnosis not present

## 2017-03-17 DIAGNOSIS — I82403 Acute embolism and thrombosis of unspecified deep veins of lower extremity, bilateral: Secondary | ICD-10-CM | POA: Diagnosis not present

## 2017-03-17 DIAGNOSIS — E039 Hypothyroidism, unspecified: Secondary | ICD-10-CM | POA: Diagnosis not present

## 2017-03-17 DIAGNOSIS — C712 Malignant neoplasm of temporal lobe: Secondary | ICD-10-CM | POA: Diagnosis not present

## 2017-03-18 DIAGNOSIS — C712 Malignant neoplasm of temporal lobe: Secondary | ICD-10-CM | POA: Diagnosis not present

## 2017-03-18 DIAGNOSIS — G4089 Other seizures: Secondary | ICD-10-CM | POA: Diagnosis not present

## 2017-03-18 DIAGNOSIS — E039 Hypothyroidism, unspecified: Secondary | ICD-10-CM | POA: Diagnosis not present

## 2017-03-18 DIAGNOSIS — I82403 Acute embolism and thrombosis of unspecified deep veins of lower extremity, bilateral: Secondary | ICD-10-CM | POA: Diagnosis not present

## 2017-03-18 DIAGNOSIS — F0390 Unspecified dementia without behavioral disturbance: Secondary | ICD-10-CM | POA: Diagnosis not present

## 2017-03-18 DIAGNOSIS — D6959 Other secondary thrombocytopenia: Secondary | ICD-10-CM | POA: Diagnosis not present

## 2017-03-19 DIAGNOSIS — G4089 Other seizures: Secondary | ICD-10-CM | POA: Diagnosis not present

## 2017-03-19 DIAGNOSIS — C712 Malignant neoplasm of temporal lobe: Secondary | ICD-10-CM | POA: Diagnosis not present

## 2017-03-19 DIAGNOSIS — D6959 Other secondary thrombocytopenia: Secondary | ICD-10-CM | POA: Diagnosis not present

## 2017-03-19 DIAGNOSIS — I82403 Acute embolism and thrombosis of unspecified deep veins of lower extremity, bilateral: Secondary | ICD-10-CM | POA: Diagnosis not present

## 2017-03-19 DIAGNOSIS — F0390 Unspecified dementia without behavioral disturbance: Secondary | ICD-10-CM | POA: Diagnosis not present

## 2017-03-19 DIAGNOSIS — E039 Hypothyroidism, unspecified: Secondary | ICD-10-CM | POA: Diagnosis not present

## 2017-03-21 DIAGNOSIS — F0390 Unspecified dementia without behavioral disturbance: Secondary | ICD-10-CM | POA: Diagnosis not present

## 2017-03-21 DIAGNOSIS — Z8619 Personal history of other infectious and parasitic diseases: Secondary | ICD-10-CM | POA: Diagnosis not present

## 2017-03-21 DIAGNOSIS — Z6822 Body mass index (BMI) 22.0-22.9, adult: Secondary | ICD-10-CM | POA: Diagnosis not present

## 2017-03-21 DIAGNOSIS — D649 Anemia, unspecified: Secondary | ICD-10-CM | POA: Diagnosis not present

## 2017-03-21 DIAGNOSIS — Z923 Personal history of irradiation: Secondary | ICD-10-CM | POA: Diagnosis not present

## 2017-03-21 DIAGNOSIS — C712 Malignant neoplasm of temporal lobe: Secondary | ICD-10-CM | POA: Diagnosis not present

## 2017-03-21 DIAGNOSIS — G4089 Other seizures: Secondary | ICD-10-CM | POA: Diagnosis not present

## 2017-03-21 DIAGNOSIS — E039 Hypothyroidism, unspecified: Secondary | ICD-10-CM | POA: Diagnosis not present

## 2017-03-21 DIAGNOSIS — Z9221 Personal history of antineoplastic chemotherapy: Secondary | ICD-10-CM | POA: Diagnosis not present

## 2017-03-21 DIAGNOSIS — R1310 Dysphagia, unspecified: Secondary | ICD-10-CM | POA: Diagnosis not present

## 2017-03-21 DIAGNOSIS — D6959 Other secondary thrombocytopenia: Secondary | ICD-10-CM | POA: Diagnosis not present

## 2017-03-21 DIAGNOSIS — Z95828 Presence of other vascular implants and grafts: Secondary | ICD-10-CM | POA: Diagnosis not present

## 2017-03-21 DIAGNOSIS — I82403 Acute embolism and thrombosis of unspecified deep veins of lower extremity, bilateral: Secondary | ICD-10-CM | POA: Diagnosis not present

## 2017-03-21 DIAGNOSIS — Z741 Need for assistance with personal care: Secondary | ICD-10-CM | POA: Diagnosis not present

## 2017-03-24 DIAGNOSIS — I82403 Acute embolism and thrombosis of unspecified deep veins of lower extremity, bilateral: Secondary | ICD-10-CM | POA: Diagnosis not present

## 2017-03-24 DIAGNOSIS — E039 Hypothyroidism, unspecified: Secondary | ICD-10-CM | POA: Diagnosis not present

## 2017-03-24 DIAGNOSIS — C712 Malignant neoplasm of temporal lobe: Secondary | ICD-10-CM | POA: Diagnosis not present

## 2017-03-24 DIAGNOSIS — F0390 Unspecified dementia without behavioral disturbance: Secondary | ICD-10-CM | POA: Diagnosis not present

## 2017-03-24 DIAGNOSIS — G4089 Other seizures: Secondary | ICD-10-CM | POA: Diagnosis not present

## 2017-03-24 DIAGNOSIS — D6959 Other secondary thrombocytopenia: Secondary | ICD-10-CM | POA: Diagnosis not present

## 2017-03-25 DIAGNOSIS — G4089 Other seizures: Secondary | ICD-10-CM | POA: Diagnosis not present

## 2017-03-25 DIAGNOSIS — E039 Hypothyroidism, unspecified: Secondary | ICD-10-CM | POA: Diagnosis not present

## 2017-03-25 DIAGNOSIS — C712 Malignant neoplasm of temporal lobe: Secondary | ICD-10-CM | POA: Diagnosis not present

## 2017-03-25 DIAGNOSIS — F0390 Unspecified dementia without behavioral disturbance: Secondary | ICD-10-CM | POA: Diagnosis not present

## 2017-03-25 DIAGNOSIS — I82403 Acute embolism and thrombosis of unspecified deep veins of lower extremity, bilateral: Secondary | ICD-10-CM | POA: Diagnosis not present

## 2017-03-25 DIAGNOSIS — D6959 Other secondary thrombocytopenia: Secondary | ICD-10-CM | POA: Diagnosis not present

## 2017-03-26 DIAGNOSIS — D6959 Other secondary thrombocytopenia: Secondary | ICD-10-CM | POA: Diagnosis not present

## 2017-03-26 DIAGNOSIS — C712 Malignant neoplasm of temporal lobe: Secondary | ICD-10-CM | POA: Diagnosis not present

## 2017-03-26 DIAGNOSIS — G4089 Other seizures: Secondary | ICD-10-CM | POA: Diagnosis not present

## 2017-03-26 DIAGNOSIS — F0390 Unspecified dementia without behavioral disturbance: Secondary | ICD-10-CM | POA: Diagnosis not present

## 2017-03-26 DIAGNOSIS — E039 Hypothyroidism, unspecified: Secondary | ICD-10-CM | POA: Diagnosis not present

## 2017-03-26 DIAGNOSIS — I82403 Acute embolism and thrombosis of unspecified deep veins of lower extremity, bilateral: Secondary | ICD-10-CM | POA: Diagnosis not present

## 2017-03-27 DIAGNOSIS — I82403 Acute embolism and thrombosis of unspecified deep veins of lower extremity, bilateral: Secondary | ICD-10-CM | POA: Diagnosis not present

## 2017-03-27 DIAGNOSIS — D6959 Other secondary thrombocytopenia: Secondary | ICD-10-CM | POA: Diagnosis not present

## 2017-03-27 DIAGNOSIS — F332 Major depressive disorder, recurrent severe without psychotic features: Secondary | ICD-10-CM | POA: Diagnosis not present

## 2017-03-27 DIAGNOSIS — G4089 Other seizures: Secondary | ICD-10-CM | POA: Diagnosis not present

## 2017-03-27 DIAGNOSIS — E039 Hypothyroidism, unspecified: Secondary | ICD-10-CM | POA: Diagnosis not present

## 2017-03-27 DIAGNOSIS — C712 Malignant neoplasm of temporal lobe: Secondary | ICD-10-CM | POA: Diagnosis not present

## 2017-03-27 DIAGNOSIS — F0390 Unspecified dementia without behavioral disturbance: Secondary | ICD-10-CM | POA: Diagnosis not present

## 2017-03-29 DIAGNOSIS — D6959 Other secondary thrombocytopenia: Secondary | ICD-10-CM | POA: Diagnosis not present

## 2017-03-29 DIAGNOSIS — E039 Hypothyroidism, unspecified: Secondary | ICD-10-CM | POA: Diagnosis not present

## 2017-03-29 DIAGNOSIS — I82403 Acute embolism and thrombosis of unspecified deep veins of lower extremity, bilateral: Secondary | ICD-10-CM | POA: Diagnosis not present

## 2017-03-29 DIAGNOSIS — C712 Malignant neoplasm of temporal lobe: Secondary | ICD-10-CM | POA: Diagnosis not present

## 2017-03-29 DIAGNOSIS — G4089 Other seizures: Secondary | ICD-10-CM | POA: Diagnosis not present

## 2017-03-29 DIAGNOSIS — F0390 Unspecified dementia without behavioral disturbance: Secondary | ICD-10-CM | POA: Diagnosis not present

## 2017-03-30 DIAGNOSIS — C712 Malignant neoplasm of temporal lobe: Secondary | ICD-10-CM | POA: Diagnosis not present

## 2017-03-30 DIAGNOSIS — F0390 Unspecified dementia without behavioral disturbance: Secondary | ICD-10-CM | POA: Diagnosis not present

## 2017-03-30 DIAGNOSIS — I82403 Acute embolism and thrombosis of unspecified deep veins of lower extremity, bilateral: Secondary | ICD-10-CM | POA: Diagnosis not present

## 2017-03-30 DIAGNOSIS — E039 Hypothyroidism, unspecified: Secondary | ICD-10-CM | POA: Diagnosis not present

## 2017-03-30 DIAGNOSIS — G4089 Other seizures: Secondary | ICD-10-CM | POA: Diagnosis not present

## 2017-03-30 DIAGNOSIS — D6959 Other secondary thrombocytopenia: Secondary | ICD-10-CM | POA: Diagnosis not present

## 2017-03-31 DIAGNOSIS — D6959 Other secondary thrombocytopenia: Secondary | ICD-10-CM | POA: Diagnosis not present

## 2017-03-31 DIAGNOSIS — F0390 Unspecified dementia without behavioral disturbance: Secondary | ICD-10-CM | POA: Diagnosis not present

## 2017-03-31 DIAGNOSIS — I82403 Acute embolism and thrombosis of unspecified deep veins of lower extremity, bilateral: Secondary | ICD-10-CM | POA: Diagnosis not present

## 2017-03-31 DIAGNOSIS — E039 Hypothyroidism, unspecified: Secondary | ICD-10-CM | POA: Diagnosis not present

## 2017-03-31 DIAGNOSIS — C712 Malignant neoplasm of temporal lobe: Secondary | ICD-10-CM | POA: Diagnosis not present

## 2017-03-31 DIAGNOSIS — G4089 Other seizures: Secondary | ICD-10-CM | POA: Diagnosis not present

## 2017-04-02 DIAGNOSIS — D6959 Other secondary thrombocytopenia: Secondary | ICD-10-CM | POA: Diagnosis not present

## 2017-04-02 DIAGNOSIS — C712 Malignant neoplasm of temporal lobe: Secondary | ICD-10-CM | POA: Diagnosis not present

## 2017-04-02 DIAGNOSIS — F0390 Unspecified dementia without behavioral disturbance: Secondary | ICD-10-CM | POA: Diagnosis not present

## 2017-04-02 DIAGNOSIS — I82403 Acute embolism and thrombosis of unspecified deep veins of lower extremity, bilateral: Secondary | ICD-10-CM | POA: Diagnosis not present

## 2017-04-02 DIAGNOSIS — G4089 Other seizures: Secondary | ICD-10-CM | POA: Diagnosis not present

## 2017-04-02 DIAGNOSIS — E039 Hypothyroidism, unspecified: Secondary | ICD-10-CM | POA: Diagnosis not present

## 2017-04-03 DIAGNOSIS — F0390 Unspecified dementia without behavioral disturbance: Secondary | ICD-10-CM | POA: Diagnosis not present

## 2017-04-03 DIAGNOSIS — D6959 Other secondary thrombocytopenia: Secondary | ICD-10-CM | POA: Diagnosis not present

## 2017-04-03 DIAGNOSIS — E039 Hypothyroidism, unspecified: Secondary | ICD-10-CM | POA: Diagnosis not present

## 2017-04-03 DIAGNOSIS — I82403 Acute embolism and thrombosis of unspecified deep veins of lower extremity, bilateral: Secondary | ICD-10-CM | POA: Diagnosis not present

## 2017-04-03 DIAGNOSIS — C712 Malignant neoplasm of temporal lobe: Secondary | ICD-10-CM | POA: Diagnosis not present

## 2017-04-03 DIAGNOSIS — F332 Major depressive disorder, recurrent severe without psychotic features: Secondary | ICD-10-CM | POA: Diagnosis not present

## 2017-04-03 DIAGNOSIS — G4089 Other seizures: Secondary | ICD-10-CM | POA: Diagnosis not present

## 2017-04-06 DIAGNOSIS — F0390 Unspecified dementia without behavioral disturbance: Secondary | ICD-10-CM | POA: Diagnosis not present

## 2017-04-06 DIAGNOSIS — D6959 Other secondary thrombocytopenia: Secondary | ICD-10-CM | POA: Diagnosis not present

## 2017-04-06 DIAGNOSIS — C712 Malignant neoplasm of temporal lobe: Secondary | ICD-10-CM | POA: Diagnosis not present

## 2017-04-06 DIAGNOSIS — I82403 Acute embolism and thrombosis of unspecified deep veins of lower extremity, bilateral: Secondary | ICD-10-CM | POA: Diagnosis not present

## 2017-04-06 DIAGNOSIS — E039 Hypothyroidism, unspecified: Secondary | ICD-10-CM | POA: Diagnosis not present

## 2017-04-06 DIAGNOSIS — G4089 Other seizures: Secondary | ICD-10-CM | POA: Diagnosis not present

## 2017-04-07 DIAGNOSIS — D6959 Other secondary thrombocytopenia: Secondary | ICD-10-CM | POA: Diagnosis not present

## 2017-04-07 DIAGNOSIS — G4089 Other seizures: Secondary | ICD-10-CM | POA: Diagnosis not present

## 2017-04-07 DIAGNOSIS — I82403 Acute embolism and thrombosis of unspecified deep veins of lower extremity, bilateral: Secondary | ICD-10-CM | POA: Diagnosis not present

## 2017-04-07 DIAGNOSIS — F0390 Unspecified dementia without behavioral disturbance: Secondary | ICD-10-CM | POA: Diagnosis not present

## 2017-04-07 DIAGNOSIS — E039 Hypothyroidism, unspecified: Secondary | ICD-10-CM | POA: Diagnosis not present

## 2017-04-07 DIAGNOSIS — C712 Malignant neoplasm of temporal lobe: Secondary | ICD-10-CM | POA: Diagnosis not present

## 2017-04-09 DIAGNOSIS — I82403 Acute embolism and thrombosis of unspecified deep veins of lower extremity, bilateral: Secondary | ICD-10-CM | POA: Diagnosis not present

## 2017-04-09 DIAGNOSIS — E039 Hypothyroidism, unspecified: Secondary | ICD-10-CM | POA: Diagnosis not present

## 2017-04-09 DIAGNOSIS — G4089 Other seizures: Secondary | ICD-10-CM | POA: Diagnosis not present

## 2017-04-09 DIAGNOSIS — F0390 Unspecified dementia without behavioral disturbance: Secondary | ICD-10-CM | POA: Diagnosis not present

## 2017-04-09 DIAGNOSIS — D6959 Other secondary thrombocytopenia: Secondary | ICD-10-CM | POA: Diagnosis not present

## 2017-04-09 DIAGNOSIS — C712 Malignant neoplasm of temporal lobe: Secondary | ICD-10-CM | POA: Diagnosis not present

## 2017-04-10 DIAGNOSIS — E039 Hypothyroidism, unspecified: Secondary | ICD-10-CM | POA: Diagnosis not present

## 2017-04-10 DIAGNOSIS — D6959 Other secondary thrombocytopenia: Secondary | ICD-10-CM | POA: Diagnosis not present

## 2017-04-10 DIAGNOSIS — F0390 Unspecified dementia without behavioral disturbance: Secondary | ICD-10-CM | POA: Diagnosis not present

## 2017-04-10 DIAGNOSIS — I82403 Acute embolism and thrombosis of unspecified deep veins of lower extremity, bilateral: Secondary | ICD-10-CM | POA: Diagnosis not present

## 2017-04-10 DIAGNOSIS — G4089 Other seizures: Secondary | ICD-10-CM | POA: Diagnosis not present

## 2017-04-10 DIAGNOSIS — C712 Malignant neoplasm of temporal lobe: Secondary | ICD-10-CM | POA: Diagnosis not present

## 2017-04-14 DIAGNOSIS — I82403 Acute embolism and thrombosis of unspecified deep veins of lower extremity, bilateral: Secondary | ICD-10-CM | POA: Diagnosis not present

## 2017-04-14 DIAGNOSIS — C712 Malignant neoplasm of temporal lobe: Secondary | ICD-10-CM | POA: Diagnosis not present

## 2017-04-14 DIAGNOSIS — D6959 Other secondary thrombocytopenia: Secondary | ICD-10-CM | POA: Diagnosis not present

## 2017-04-14 DIAGNOSIS — E039 Hypothyroidism, unspecified: Secondary | ICD-10-CM | POA: Diagnosis not present

## 2017-04-14 DIAGNOSIS — F0390 Unspecified dementia without behavioral disturbance: Secondary | ICD-10-CM | POA: Diagnosis not present

## 2017-04-14 DIAGNOSIS — G4089 Other seizures: Secondary | ICD-10-CM | POA: Diagnosis not present

## 2017-04-16 DIAGNOSIS — F0390 Unspecified dementia without behavioral disturbance: Secondary | ICD-10-CM | POA: Diagnosis not present

## 2017-04-16 DIAGNOSIS — D6959 Other secondary thrombocytopenia: Secondary | ICD-10-CM | POA: Diagnosis not present

## 2017-04-16 DIAGNOSIS — G4089 Other seizures: Secondary | ICD-10-CM | POA: Diagnosis not present

## 2017-04-16 DIAGNOSIS — C712 Malignant neoplasm of temporal lobe: Secondary | ICD-10-CM | POA: Diagnosis not present

## 2017-04-16 DIAGNOSIS — E039 Hypothyroidism, unspecified: Secondary | ICD-10-CM | POA: Diagnosis not present

## 2017-04-16 DIAGNOSIS — I82403 Acute embolism and thrombosis of unspecified deep veins of lower extremity, bilateral: Secondary | ICD-10-CM | POA: Diagnosis not present

## 2017-04-20 DIAGNOSIS — I82403 Acute embolism and thrombosis of unspecified deep veins of lower extremity, bilateral: Secondary | ICD-10-CM | POA: Diagnosis not present

## 2017-04-20 DIAGNOSIS — Z6822 Body mass index (BMI) 22.0-22.9, adult: Secondary | ICD-10-CM | POA: Diagnosis not present

## 2017-04-20 DIAGNOSIS — Z741 Need for assistance with personal care: Secondary | ICD-10-CM | POA: Diagnosis not present

## 2017-04-20 DIAGNOSIS — E039 Hypothyroidism, unspecified: Secondary | ICD-10-CM | POA: Diagnosis not present

## 2017-04-20 DIAGNOSIS — R1310 Dysphagia, unspecified: Secondary | ICD-10-CM | POA: Diagnosis not present

## 2017-04-20 DIAGNOSIS — F0390 Unspecified dementia without behavioral disturbance: Secondary | ICD-10-CM | POA: Diagnosis not present

## 2017-04-20 DIAGNOSIS — Z95828 Presence of other vascular implants and grafts: Secondary | ICD-10-CM | POA: Diagnosis not present

## 2017-04-20 DIAGNOSIS — D6959 Other secondary thrombocytopenia: Secondary | ICD-10-CM | POA: Diagnosis not present

## 2017-04-20 DIAGNOSIS — G4089 Other seizures: Secondary | ICD-10-CM | POA: Diagnosis not present

## 2017-04-20 DIAGNOSIS — Z8619 Personal history of other infectious and parasitic diseases: Secondary | ICD-10-CM | POA: Diagnosis not present

## 2017-04-20 DIAGNOSIS — Z9221 Personal history of antineoplastic chemotherapy: Secondary | ICD-10-CM | POA: Diagnosis not present

## 2017-04-20 DIAGNOSIS — Z923 Personal history of irradiation: Secondary | ICD-10-CM | POA: Diagnosis not present

## 2017-04-20 DIAGNOSIS — C712 Malignant neoplasm of temporal lobe: Secondary | ICD-10-CM | POA: Diagnosis not present

## 2017-04-20 DIAGNOSIS — D649 Anemia, unspecified: Secondary | ICD-10-CM | POA: Diagnosis not present

## 2017-04-22 DIAGNOSIS — E039 Hypothyroidism, unspecified: Secondary | ICD-10-CM | POA: Diagnosis not present

## 2017-04-22 DIAGNOSIS — I82403 Acute embolism and thrombosis of unspecified deep veins of lower extremity, bilateral: Secondary | ICD-10-CM | POA: Diagnosis not present

## 2017-04-22 DIAGNOSIS — D6959 Other secondary thrombocytopenia: Secondary | ICD-10-CM | POA: Diagnosis not present

## 2017-04-22 DIAGNOSIS — F0390 Unspecified dementia without behavioral disturbance: Secondary | ICD-10-CM | POA: Diagnosis not present

## 2017-04-22 DIAGNOSIS — C712 Malignant neoplasm of temporal lobe: Secondary | ICD-10-CM | POA: Diagnosis not present

## 2017-04-22 DIAGNOSIS — G4089 Other seizures: Secondary | ICD-10-CM | POA: Diagnosis not present

## 2017-04-23 DIAGNOSIS — G4089 Other seizures: Secondary | ICD-10-CM | POA: Diagnosis not present

## 2017-04-23 DIAGNOSIS — E039 Hypothyroidism, unspecified: Secondary | ICD-10-CM | POA: Diagnosis not present

## 2017-04-23 DIAGNOSIS — D6959 Other secondary thrombocytopenia: Secondary | ICD-10-CM | POA: Diagnosis not present

## 2017-04-23 DIAGNOSIS — F0390 Unspecified dementia without behavioral disturbance: Secondary | ICD-10-CM | POA: Diagnosis not present

## 2017-04-23 DIAGNOSIS — C712 Malignant neoplasm of temporal lobe: Secondary | ICD-10-CM | POA: Diagnosis not present

## 2017-04-23 DIAGNOSIS — I82403 Acute embolism and thrombosis of unspecified deep veins of lower extremity, bilateral: Secondary | ICD-10-CM | POA: Diagnosis not present

## 2017-04-24 DIAGNOSIS — G4089 Other seizures: Secondary | ICD-10-CM | POA: Diagnosis not present

## 2017-04-24 DIAGNOSIS — I82403 Acute embolism and thrombosis of unspecified deep veins of lower extremity, bilateral: Secondary | ICD-10-CM | POA: Diagnosis not present

## 2017-04-24 DIAGNOSIS — F0390 Unspecified dementia without behavioral disturbance: Secondary | ICD-10-CM | POA: Diagnosis not present

## 2017-04-24 DIAGNOSIS — C712 Malignant neoplasm of temporal lobe: Secondary | ICD-10-CM | POA: Diagnosis not present

## 2017-04-24 DIAGNOSIS — D6959 Other secondary thrombocytopenia: Secondary | ICD-10-CM | POA: Diagnosis not present

## 2017-04-24 DIAGNOSIS — E039 Hypothyroidism, unspecified: Secondary | ICD-10-CM | POA: Diagnosis not present

## 2017-04-25 DIAGNOSIS — G4089 Other seizures: Secondary | ICD-10-CM | POA: Diagnosis not present

## 2017-04-25 DIAGNOSIS — E039 Hypothyroidism, unspecified: Secondary | ICD-10-CM | POA: Diagnosis not present

## 2017-04-25 DIAGNOSIS — C712 Malignant neoplasm of temporal lobe: Secondary | ICD-10-CM | POA: Diagnosis not present

## 2017-04-25 DIAGNOSIS — F0390 Unspecified dementia without behavioral disturbance: Secondary | ICD-10-CM | POA: Diagnosis not present

## 2017-04-25 DIAGNOSIS — I82403 Acute embolism and thrombosis of unspecified deep veins of lower extremity, bilateral: Secondary | ICD-10-CM | POA: Diagnosis not present

## 2017-04-25 DIAGNOSIS — D6959 Other secondary thrombocytopenia: Secondary | ICD-10-CM | POA: Diagnosis not present

## 2017-04-28 DIAGNOSIS — G4089 Other seizures: Secondary | ICD-10-CM | POA: Diagnosis not present

## 2017-04-28 DIAGNOSIS — I82403 Acute embolism and thrombosis of unspecified deep veins of lower extremity, bilateral: Secondary | ICD-10-CM | POA: Diagnosis not present

## 2017-04-28 DIAGNOSIS — F0390 Unspecified dementia without behavioral disturbance: Secondary | ICD-10-CM | POA: Diagnosis not present

## 2017-04-28 DIAGNOSIS — D6959 Other secondary thrombocytopenia: Secondary | ICD-10-CM | POA: Diagnosis not present

## 2017-04-28 DIAGNOSIS — C712 Malignant neoplasm of temporal lobe: Secondary | ICD-10-CM | POA: Diagnosis not present

## 2017-04-28 DIAGNOSIS — E039 Hypothyroidism, unspecified: Secondary | ICD-10-CM | POA: Diagnosis not present

## 2017-04-30 DIAGNOSIS — G4089 Other seizures: Secondary | ICD-10-CM | POA: Diagnosis not present

## 2017-04-30 DIAGNOSIS — D6959 Other secondary thrombocytopenia: Secondary | ICD-10-CM | POA: Diagnosis not present

## 2017-04-30 DIAGNOSIS — F0390 Unspecified dementia without behavioral disturbance: Secondary | ICD-10-CM | POA: Diagnosis not present

## 2017-04-30 DIAGNOSIS — C712 Malignant neoplasm of temporal lobe: Secondary | ICD-10-CM | POA: Diagnosis not present

## 2017-04-30 DIAGNOSIS — E039 Hypothyroidism, unspecified: Secondary | ICD-10-CM | POA: Diagnosis not present

## 2017-04-30 DIAGNOSIS — I82403 Acute embolism and thrombosis of unspecified deep veins of lower extremity, bilateral: Secondary | ICD-10-CM | POA: Diagnosis not present

## 2017-05-01 DIAGNOSIS — E039 Hypothyroidism, unspecified: Secondary | ICD-10-CM | POA: Diagnosis not present

## 2017-05-01 DIAGNOSIS — G4089 Other seizures: Secondary | ICD-10-CM | POA: Diagnosis not present

## 2017-05-01 DIAGNOSIS — I82403 Acute embolism and thrombosis of unspecified deep veins of lower extremity, bilateral: Secondary | ICD-10-CM | POA: Diagnosis not present

## 2017-05-01 DIAGNOSIS — F0390 Unspecified dementia without behavioral disturbance: Secondary | ICD-10-CM | POA: Diagnosis not present

## 2017-05-01 DIAGNOSIS — C712 Malignant neoplasm of temporal lobe: Secondary | ICD-10-CM | POA: Diagnosis not present

## 2017-05-01 DIAGNOSIS — D6959 Other secondary thrombocytopenia: Secondary | ICD-10-CM | POA: Diagnosis not present

## 2017-05-03 DIAGNOSIS — G4089 Other seizures: Secondary | ICD-10-CM | POA: Diagnosis not present

## 2017-05-03 DIAGNOSIS — D6959 Other secondary thrombocytopenia: Secondary | ICD-10-CM | POA: Diagnosis not present

## 2017-05-03 DIAGNOSIS — C712 Malignant neoplasm of temporal lobe: Secondary | ICD-10-CM | POA: Diagnosis not present

## 2017-05-03 DIAGNOSIS — I82403 Acute embolism and thrombosis of unspecified deep veins of lower extremity, bilateral: Secondary | ICD-10-CM | POA: Diagnosis not present

## 2017-05-03 DIAGNOSIS — F0390 Unspecified dementia without behavioral disturbance: Secondary | ICD-10-CM | POA: Diagnosis not present

## 2017-05-03 DIAGNOSIS — E039 Hypothyroidism, unspecified: Secondary | ICD-10-CM | POA: Diagnosis not present

## 2017-05-04 DIAGNOSIS — F0390 Unspecified dementia without behavioral disturbance: Secondary | ICD-10-CM | POA: Diagnosis not present

## 2017-05-04 DIAGNOSIS — I82403 Acute embolism and thrombosis of unspecified deep veins of lower extremity, bilateral: Secondary | ICD-10-CM | POA: Diagnosis not present

## 2017-05-04 DIAGNOSIS — G4089 Other seizures: Secondary | ICD-10-CM | POA: Diagnosis not present

## 2017-05-04 DIAGNOSIS — E039 Hypothyroidism, unspecified: Secondary | ICD-10-CM | POA: Diagnosis not present

## 2017-05-04 DIAGNOSIS — D6959 Other secondary thrombocytopenia: Secondary | ICD-10-CM | POA: Diagnosis not present

## 2017-05-04 DIAGNOSIS — C712 Malignant neoplasm of temporal lobe: Secondary | ICD-10-CM | POA: Diagnosis not present

## 2017-05-05 DIAGNOSIS — I82403 Acute embolism and thrombosis of unspecified deep veins of lower extremity, bilateral: Secondary | ICD-10-CM | POA: Diagnosis not present

## 2017-05-05 DIAGNOSIS — C712 Malignant neoplasm of temporal lobe: Secondary | ICD-10-CM | POA: Diagnosis not present

## 2017-05-05 DIAGNOSIS — F0390 Unspecified dementia without behavioral disturbance: Secondary | ICD-10-CM | POA: Diagnosis not present

## 2017-05-05 DIAGNOSIS — D6959 Other secondary thrombocytopenia: Secondary | ICD-10-CM | POA: Diagnosis not present

## 2017-05-05 DIAGNOSIS — G4089 Other seizures: Secondary | ICD-10-CM | POA: Diagnosis not present

## 2017-05-05 DIAGNOSIS — E039 Hypothyroidism, unspecified: Secondary | ICD-10-CM | POA: Diagnosis not present

## 2017-05-06 DIAGNOSIS — G4089 Other seizures: Secondary | ICD-10-CM | POA: Diagnosis not present

## 2017-05-06 DIAGNOSIS — D6959 Other secondary thrombocytopenia: Secondary | ICD-10-CM | POA: Diagnosis not present

## 2017-05-06 DIAGNOSIS — E039 Hypothyroidism, unspecified: Secondary | ICD-10-CM | POA: Diagnosis not present

## 2017-05-06 DIAGNOSIS — I82403 Acute embolism and thrombosis of unspecified deep veins of lower extremity, bilateral: Secondary | ICD-10-CM | POA: Diagnosis not present

## 2017-05-06 DIAGNOSIS — F0390 Unspecified dementia without behavioral disturbance: Secondary | ICD-10-CM | POA: Diagnosis not present

## 2017-05-06 DIAGNOSIS — C712 Malignant neoplasm of temporal lobe: Secondary | ICD-10-CM | POA: Diagnosis not present

## 2017-05-07 DIAGNOSIS — F0390 Unspecified dementia without behavioral disturbance: Secondary | ICD-10-CM | POA: Diagnosis not present

## 2017-05-07 DIAGNOSIS — C712 Malignant neoplasm of temporal lobe: Secondary | ICD-10-CM | POA: Diagnosis not present

## 2017-05-07 DIAGNOSIS — I82403 Acute embolism and thrombosis of unspecified deep veins of lower extremity, bilateral: Secondary | ICD-10-CM | POA: Diagnosis not present

## 2017-05-07 DIAGNOSIS — E039 Hypothyroidism, unspecified: Secondary | ICD-10-CM | POA: Diagnosis not present

## 2017-05-07 DIAGNOSIS — D6959 Other secondary thrombocytopenia: Secondary | ICD-10-CM | POA: Diagnosis not present

## 2017-05-07 DIAGNOSIS — G4089 Other seizures: Secondary | ICD-10-CM | POA: Diagnosis not present

## 2017-05-08 DIAGNOSIS — G4089 Other seizures: Secondary | ICD-10-CM | POA: Diagnosis not present

## 2017-05-08 DIAGNOSIS — D6959 Other secondary thrombocytopenia: Secondary | ICD-10-CM | POA: Diagnosis not present

## 2017-05-08 DIAGNOSIS — E039 Hypothyroidism, unspecified: Secondary | ICD-10-CM | POA: Diagnosis not present

## 2017-05-08 DIAGNOSIS — I82403 Acute embolism and thrombosis of unspecified deep veins of lower extremity, bilateral: Secondary | ICD-10-CM | POA: Diagnosis not present

## 2017-05-08 DIAGNOSIS — F0390 Unspecified dementia without behavioral disturbance: Secondary | ICD-10-CM | POA: Diagnosis not present

## 2017-05-08 DIAGNOSIS — C712 Malignant neoplasm of temporal lobe: Secondary | ICD-10-CM | POA: Diagnosis not present

## 2017-05-12 DIAGNOSIS — D6959 Other secondary thrombocytopenia: Secondary | ICD-10-CM | POA: Diagnosis not present

## 2017-05-12 DIAGNOSIS — E039 Hypothyroidism, unspecified: Secondary | ICD-10-CM | POA: Diagnosis not present

## 2017-05-12 DIAGNOSIS — F0390 Unspecified dementia without behavioral disturbance: Secondary | ICD-10-CM | POA: Diagnosis not present

## 2017-05-12 DIAGNOSIS — C712 Malignant neoplasm of temporal lobe: Secondary | ICD-10-CM | POA: Diagnosis not present

## 2017-05-12 DIAGNOSIS — I82403 Acute embolism and thrombosis of unspecified deep veins of lower extremity, bilateral: Secondary | ICD-10-CM | POA: Diagnosis not present

## 2017-05-12 DIAGNOSIS — G4089 Other seizures: Secondary | ICD-10-CM | POA: Diagnosis not present

## 2017-05-14 DIAGNOSIS — C712 Malignant neoplasm of temporal lobe: Secondary | ICD-10-CM | POA: Diagnosis not present

## 2017-05-14 DIAGNOSIS — E039 Hypothyroidism, unspecified: Secondary | ICD-10-CM | POA: Diagnosis not present

## 2017-05-14 DIAGNOSIS — G4089 Other seizures: Secondary | ICD-10-CM | POA: Diagnosis not present

## 2017-05-14 DIAGNOSIS — D6959 Other secondary thrombocytopenia: Secondary | ICD-10-CM | POA: Diagnosis not present

## 2017-05-14 DIAGNOSIS — F0390 Unspecified dementia without behavioral disturbance: Secondary | ICD-10-CM | POA: Diagnosis not present

## 2017-05-14 DIAGNOSIS — I82403 Acute embolism and thrombosis of unspecified deep veins of lower extremity, bilateral: Secondary | ICD-10-CM | POA: Diagnosis not present

## 2017-05-15 DIAGNOSIS — C712 Malignant neoplasm of temporal lobe: Secondary | ICD-10-CM | POA: Diagnosis not present

## 2017-05-15 DIAGNOSIS — E039 Hypothyroidism, unspecified: Secondary | ICD-10-CM | POA: Diagnosis not present

## 2017-05-15 DIAGNOSIS — D6959 Other secondary thrombocytopenia: Secondary | ICD-10-CM | POA: Diagnosis not present

## 2017-05-15 DIAGNOSIS — G4089 Other seizures: Secondary | ICD-10-CM | POA: Diagnosis not present

## 2017-05-15 DIAGNOSIS — I82403 Acute embolism and thrombosis of unspecified deep veins of lower extremity, bilateral: Secondary | ICD-10-CM | POA: Diagnosis not present

## 2017-05-15 DIAGNOSIS — F0390 Unspecified dementia without behavioral disturbance: Secondary | ICD-10-CM | POA: Diagnosis not present

## 2017-05-18 DIAGNOSIS — C712 Malignant neoplasm of temporal lobe: Secondary | ICD-10-CM | POA: Diagnosis not present

## 2017-05-18 DIAGNOSIS — F0390 Unspecified dementia without behavioral disturbance: Secondary | ICD-10-CM | POA: Diagnosis not present

## 2017-05-18 DIAGNOSIS — E039 Hypothyroidism, unspecified: Secondary | ICD-10-CM | POA: Diagnosis not present

## 2017-05-18 DIAGNOSIS — G4089 Other seizures: Secondary | ICD-10-CM | POA: Diagnosis not present

## 2017-05-18 DIAGNOSIS — I82403 Acute embolism and thrombosis of unspecified deep veins of lower extremity, bilateral: Secondary | ICD-10-CM | POA: Diagnosis not present

## 2017-05-18 DIAGNOSIS — D6959 Other secondary thrombocytopenia: Secondary | ICD-10-CM | POA: Diagnosis not present

## 2017-05-19 DIAGNOSIS — G4089 Other seizures: Secondary | ICD-10-CM | POA: Diagnosis not present

## 2017-05-19 DIAGNOSIS — I82403 Acute embolism and thrombosis of unspecified deep veins of lower extremity, bilateral: Secondary | ICD-10-CM | POA: Diagnosis not present

## 2017-05-19 DIAGNOSIS — F0390 Unspecified dementia without behavioral disturbance: Secondary | ICD-10-CM | POA: Diagnosis not present

## 2017-05-19 DIAGNOSIS — E039 Hypothyroidism, unspecified: Secondary | ICD-10-CM | POA: Diagnosis not present

## 2017-05-19 DIAGNOSIS — C712 Malignant neoplasm of temporal lobe: Secondary | ICD-10-CM | POA: Diagnosis not present

## 2017-05-19 DIAGNOSIS — D6959 Other secondary thrombocytopenia: Secondary | ICD-10-CM | POA: Diagnosis not present

## 2017-05-21 DIAGNOSIS — D6959 Other secondary thrombocytopenia: Secondary | ICD-10-CM | POA: Diagnosis not present

## 2017-05-21 DIAGNOSIS — F0390 Unspecified dementia without behavioral disturbance: Secondary | ICD-10-CM | POA: Diagnosis not present

## 2017-05-21 DIAGNOSIS — Z95828 Presence of other vascular implants and grafts: Secondary | ICD-10-CM | POA: Diagnosis not present

## 2017-05-21 DIAGNOSIS — C712 Malignant neoplasm of temporal lobe: Secondary | ICD-10-CM | POA: Diagnosis not present

## 2017-05-21 DIAGNOSIS — Z741 Need for assistance with personal care: Secondary | ICD-10-CM | POA: Diagnosis not present

## 2017-05-21 DIAGNOSIS — Z6822 Body mass index (BMI) 22.0-22.9, adult: Secondary | ICD-10-CM | POA: Diagnosis not present

## 2017-05-21 DIAGNOSIS — Z923 Personal history of irradiation: Secondary | ICD-10-CM | POA: Diagnosis not present

## 2017-05-21 DIAGNOSIS — E039 Hypothyroidism, unspecified: Secondary | ICD-10-CM | POA: Diagnosis not present

## 2017-05-21 DIAGNOSIS — D649 Anemia, unspecified: Secondary | ICD-10-CM | POA: Diagnosis not present

## 2017-05-21 DIAGNOSIS — R1310 Dysphagia, unspecified: Secondary | ICD-10-CM | POA: Diagnosis not present

## 2017-05-21 DIAGNOSIS — Z9221 Personal history of antineoplastic chemotherapy: Secondary | ICD-10-CM | POA: Diagnosis not present

## 2017-05-21 DIAGNOSIS — I82403 Acute embolism and thrombosis of unspecified deep veins of lower extremity, bilateral: Secondary | ICD-10-CM | POA: Diagnosis not present

## 2017-05-21 DIAGNOSIS — G4089 Other seizures: Secondary | ICD-10-CM | POA: Diagnosis not present

## 2017-05-21 DIAGNOSIS — Z8619 Personal history of other infectious and parasitic diseases: Secondary | ICD-10-CM | POA: Diagnosis not present

## 2017-05-22 DIAGNOSIS — F0390 Unspecified dementia without behavioral disturbance: Secondary | ICD-10-CM | POA: Diagnosis not present

## 2017-05-22 DIAGNOSIS — G4089 Other seizures: Secondary | ICD-10-CM | POA: Diagnosis not present

## 2017-05-22 DIAGNOSIS — I82403 Acute embolism and thrombosis of unspecified deep veins of lower extremity, bilateral: Secondary | ICD-10-CM | POA: Diagnosis not present

## 2017-05-22 DIAGNOSIS — C712 Malignant neoplasm of temporal lobe: Secondary | ICD-10-CM | POA: Diagnosis not present

## 2017-05-22 DIAGNOSIS — E039 Hypothyroidism, unspecified: Secondary | ICD-10-CM | POA: Diagnosis not present

## 2017-05-22 DIAGNOSIS — D6959 Other secondary thrombocytopenia: Secondary | ICD-10-CM | POA: Diagnosis not present

## 2017-05-26 DIAGNOSIS — I82403 Acute embolism and thrombosis of unspecified deep veins of lower extremity, bilateral: Secondary | ICD-10-CM | POA: Diagnosis not present

## 2017-05-26 DIAGNOSIS — D6959 Other secondary thrombocytopenia: Secondary | ICD-10-CM | POA: Diagnosis not present

## 2017-05-26 DIAGNOSIS — F0390 Unspecified dementia without behavioral disturbance: Secondary | ICD-10-CM | POA: Diagnosis not present

## 2017-05-26 DIAGNOSIS — E039 Hypothyroidism, unspecified: Secondary | ICD-10-CM | POA: Diagnosis not present

## 2017-05-26 DIAGNOSIS — C712 Malignant neoplasm of temporal lobe: Secondary | ICD-10-CM | POA: Diagnosis not present

## 2017-05-26 DIAGNOSIS — G4089 Other seizures: Secondary | ICD-10-CM | POA: Diagnosis not present

## 2017-05-28 DIAGNOSIS — I82403 Acute embolism and thrombosis of unspecified deep veins of lower extremity, bilateral: Secondary | ICD-10-CM | POA: Diagnosis not present

## 2017-05-28 DIAGNOSIS — E039 Hypothyroidism, unspecified: Secondary | ICD-10-CM | POA: Diagnosis not present

## 2017-05-28 DIAGNOSIS — D6959 Other secondary thrombocytopenia: Secondary | ICD-10-CM | POA: Diagnosis not present

## 2017-05-28 DIAGNOSIS — C712 Malignant neoplasm of temporal lobe: Secondary | ICD-10-CM | POA: Diagnosis not present

## 2017-05-28 DIAGNOSIS — G4089 Other seizures: Secondary | ICD-10-CM | POA: Diagnosis not present

## 2017-05-28 DIAGNOSIS — F0390 Unspecified dementia without behavioral disturbance: Secondary | ICD-10-CM | POA: Diagnosis not present

## 2017-05-29 DIAGNOSIS — F0390 Unspecified dementia without behavioral disturbance: Secondary | ICD-10-CM | POA: Diagnosis not present

## 2017-05-29 DIAGNOSIS — I82403 Acute embolism and thrombosis of unspecified deep veins of lower extremity, bilateral: Secondary | ICD-10-CM | POA: Diagnosis not present

## 2017-05-29 DIAGNOSIS — C712 Malignant neoplasm of temporal lobe: Secondary | ICD-10-CM | POA: Diagnosis not present

## 2017-05-29 DIAGNOSIS — D6959 Other secondary thrombocytopenia: Secondary | ICD-10-CM | POA: Diagnosis not present

## 2017-05-29 DIAGNOSIS — E039 Hypothyroidism, unspecified: Secondary | ICD-10-CM | POA: Diagnosis not present

## 2017-05-29 DIAGNOSIS — G4089 Other seizures: Secondary | ICD-10-CM | POA: Diagnosis not present

## 2017-05-30 DIAGNOSIS — E039 Hypothyroidism, unspecified: Secondary | ICD-10-CM | POA: Diagnosis not present

## 2017-05-30 DIAGNOSIS — C712 Malignant neoplasm of temporal lobe: Secondary | ICD-10-CM | POA: Diagnosis not present

## 2017-05-30 DIAGNOSIS — I82403 Acute embolism and thrombosis of unspecified deep veins of lower extremity, bilateral: Secondary | ICD-10-CM | POA: Diagnosis not present

## 2017-05-30 DIAGNOSIS — D6959 Other secondary thrombocytopenia: Secondary | ICD-10-CM | POA: Diagnosis not present

## 2017-05-30 DIAGNOSIS — G4089 Other seizures: Secondary | ICD-10-CM | POA: Diagnosis not present

## 2017-05-30 DIAGNOSIS — F0390 Unspecified dementia without behavioral disturbance: Secondary | ICD-10-CM | POA: Diagnosis not present

## 2017-05-31 DIAGNOSIS — C712 Malignant neoplasm of temporal lobe: Secondary | ICD-10-CM | POA: Diagnosis not present

## 2017-05-31 DIAGNOSIS — I82403 Acute embolism and thrombosis of unspecified deep veins of lower extremity, bilateral: Secondary | ICD-10-CM | POA: Diagnosis not present

## 2017-05-31 DIAGNOSIS — D6959 Other secondary thrombocytopenia: Secondary | ICD-10-CM | POA: Diagnosis not present

## 2017-05-31 DIAGNOSIS — F0390 Unspecified dementia without behavioral disturbance: Secondary | ICD-10-CM | POA: Diagnosis not present

## 2017-05-31 DIAGNOSIS — E039 Hypothyroidism, unspecified: Secondary | ICD-10-CM | POA: Diagnosis not present

## 2017-05-31 DIAGNOSIS — G4089 Other seizures: Secondary | ICD-10-CM | POA: Diagnosis not present

## 2017-06-01 DIAGNOSIS — C712 Malignant neoplasm of temporal lobe: Secondary | ICD-10-CM | POA: Diagnosis not present

## 2017-06-01 DIAGNOSIS — G4089 Other seizures: Secondary | ICD-10-CM | POA: Diagnosis not present

## 2017-06-01 DIAGNOSIS — F0390 Unspecified dementia without behavioral disturbance: Secondary | ICD-10-CM | POA: Diagnosis not present

## 2017-06-01 DIAGNOSIS — I82403 Acute embolism and thrombosis of unspecified deep veins of lower extremity, bilateral: Secondary | ICD-10-CM | POA: Diagnosis not present

## 2017-06-01 DIAGNOSIS — E039 Hypothyroidism, unspecified: Secondary | ICD-10-CM | POA: Diagnosis not present

## 2017-06-01 DIAGNOSIS — D6959 Other secondary thrombocytopenia: Secondary | ICD-10-CM | POA: Diagnosis not present

## 2017-06-02 DIAGNOSIS — C712 Malignant neoplasm of temporal lobe: Secondary | ICD-10-CM | POA: Diagnosis not present

## 2017-06-02 DIAGNOSIS — E039 Hypothyroidism, unspecified: Secondary | ICD-10-CM | POA: Diagnosis not present

## 2017-06-02 DIAGNOSIS — G4089 Other seizures: Secondary | ICD-10-CM | POA: Diagnosis not present

## 2017-06-02 DIAGNOSIS — I82403 Acute embolism and thrombosis of unspecified deep veins of lower extremity, bilateral: Secondary | ICD-10-CM | POA: Diagnosis not present

## 2017-06-02 DIAGNOSIS — F0390 Unspecified dementia without behavioral disturbance: Secondary | ICD-10-CM | POA: Diagnosis not present

## 2017-06-02 DIAGNOSIS — D6959 Other secondary thrombocytopenia: Secondary | ICD-10-CM | POA: Diagnosis not present

## 2017-06-04 DIAGNOSIS — F0390 Unspecified dementia without behavioral disturbance: Secondary | ICD-10-CM | POA: Diagnosis not present

## 2017-06-04 DIAGNOSIS — D6959 Other secondary thrombocytopenia: Secondary | ICD-10-CM | POA: Diagnosis not present

## 2017-06-04 DIAGNOSIS — G4089 Other seizures: Secondary | ICD-10-CM | POA: Diagnosis not present

## 2017-06-04 DIAGNOSIS — E039 Hypothyroidism, unspecified: Secondary | ICD-10-CM | POA: Diagnosis not present

## 2017-06-04 DIAGNOSIS — I82403 Acute embolism and thrombosis of unspecified deep veins of lower extremity, bilateral: Secondary | ICD-10-CM | POA: Diagnosis not present

## 2017-06-04 DIAGNOSIS — C712 Malignant neoplasm of temporal lobe: Secondary | ICD-10-CM | POA: Diagnosis not present

## 2017-06-05 DIAGNOSIS — C712 Malignant neoplasm of temporal lobe: Secondary | ICD-10-CM | POA: Diagnosis not present

## 2017-06-05 DIAGNOSIS — F0390 Unspecified dementia without behavioral disturbance: Secondary | ICD-10-CM | POA: Diagnosis not present

## 2017-06-05 DIAGNOSIS — D6959 Other secondary thrombocytopenia: Secondary | ICD-10-CM | POA: Diagnosis not present

## 2017-06-05 DIAGNOSIS — G4089 Other seizures: Secondary | ICD-10-CM | POA: Diagnosis not present

## 2017-06-05 DIAGNOSIS — E039 Hypothyroidism, unspecified: Secondary | ICD-10-CM | POA: Diagnosis not present

## 2017-06-05 DIAGNOSIS — I82403 Acute embolism and thrombosis of unspecified deep veins of lower extremity, bilateral: Secondary | ICD-10-CM | POA: Diagnosis not present

## 2017-06-06 DIAGNOSIS — F0390 Unspecified dementia without behavioral disturbance: Secondary | ICD-10-CM | POA: Diagnosis not present

## 2017-06-06 DIAGNOSIS — E039 Hypothyroidism, unspecified: Secondary | ICD-10-CM | POA: Diagnosis not present

## 2017-06-06 DIAGNOSIS — I82403 Acute embolism and thrombosis of unspecified deep veins of lower extremity, bilateral: Secondary | ICD-10-CM | POA: Diagnosis not present

## 2017-06-06 DIAGNOSIS — G4089 Other seizures: Secondary | ICD-10-CM | POA: Diagnosis not present

## 2017-06-06 DIAGNOSIS — C712 Malignant neoplasm of temporal lobe: Secondary | ICD-10-CM | POA: Diagnosis not present

## 2017-06-06 DIAGNOSIS — D6959 Other secondary thrombocytopenia: Secondary | ICD-10-CM | POA: Diagnosis not present

## 2017-06-08 DIAGNOSIS — F0390 Unspecified dementia without behavioral disturbance: Secondary | ICD-10-CM | POA: Diagnosis not present

## 2017-06-08 DIAGNOSIS — E039 Hypothyroidism, unspecified: Secondary | ICD-10-CM | POA: Diagnosis not present

## 2017-06-08 DIAGNOSIS — D6959 Other secondary thrombocytopenia: Secondary | ICD-10-CM | POA: Diagnosis not present

## 2017-06-08 DIAGNOSIS — C712 Malignant neoplasm of temporal lobe: Secondary | ICD-10-CM | POA: Diagnosis not present

## 2017-06-08 DIAGNOSIS — I82403 Acute embolism and thrombosis of unspecified deep veins of lower extremity, bilateral: Secondary | ICD-10-CM | POA: Diagnosis not present

## 2017-06-08 DIAGNOSIS — G4089 Other seizures: Secondary | ICD-10-CM | POA: Diagnosis not present

## 2017-06-09 DIAGNOSIS — G4089 Other seizures: Secondary | ICD-10-CM | POA: Diagnosis not present

## 2017-06-09 DIAGNOSIS — F0390 Unspecified dementia without behavioral disturbance: Secondary | ICD-10-CM | POA: Diagnosis not present

## 2017-06-09 DIAGNOSIS — C712 Malignant neoplasm of temporal lobe: Secondary | ICD-10-CM | POA: Diagnosis not present

## 2017-06-09 DIAGNOSIS — I82403 Acute embolism and thrombosis of unspecified deep veins of lower extremity, bilateral: Secondary | ICD-10-CM | POA: Diagnosis not present

## 2017-06-09 DIAGNOSIS — D6959 Other secondary thrombocytopenia: Secondary | ICD-10-CM | POA: Diagnosis not present

## 2017-06-09 DIAGNOSIS — E039 Hypothyroidism, unspecified: Secondary | ICD-10-CM | POA: Diagnosis not present

## 2017-06-10 DIAGNOSIS — I82403 Acute embolism and thrombosis of unspecified deep veins of lower extremity, bilateral: Secondary | ICD-10-CM | POA: Diagnosis not present

## 2017-06-10 DIAGNOSIS — C712 Malignant neoplasm of temporal lobe: Secondary | ICD-10-CM | POA: Diagnosis not present

## 2017-06-10 DIAGNOSIS — G4089 Other seizures: Secondary | ICD-10-CM | POA: Diagnosis not present

## 2017-06-10 DIAGNOSIS — E039 Hypothyroidism, unspecified: Secondary | ICD-10-CM | POA: Diagnosis not present

## 2017-06-10 DIAGNOSIS — F0390 Unspecified dementia without behavioral disturbance: Secondary | ICD-10-CM | POA: Diagnosis not present

## 2017-06-10 DIAGNOSIS — D6959 Other secondary thrombocytopenia: Secondary | ICD-10-CM | POA: Diagnosis not present

## 2017-06-12 DIAGNOSIS — G4089 Other seizures: Secondary | ICD-10-CM | POA: Diagnosis not present

## 2017-06-12 DIAGNOSIS — D6959 Other secondary thrombocytopenia: Secondary | ICD-10-CM | POA: Diagnosis not present

## 2017-06-12 DIAGNOSIS — E039 Hypothyroidism, unspecified: Secondary | ICD-10-CM | POA: Diagnosis not present

## 2017-06-12 DIAGNOSIS — C712 Malignant neoplasm of temporal lobe: Secondary | ICD-10-CM | POA: Diagnosis not present

## 2017-06-12 DIAGNOSIS — F0390 Unspecified dementia without behavioral disturbance: Secondary | ICD-10-CM | POA: Diagnosis not present

## 2017-06-12 DIAGNOSIS — I82403 Acute embolism and thrombosis of unspecified deep veins of lower extremity, bilateral: Secondary | ICD-10-CM | POA: Diagnosis not present

## 2017-06-15 DIAGNOSIS — E039 Hypothyroidism, unspecified: Secondary | ICD-10-CM | POA: Diagnosis not present

## 2017-06-15 DIAGNOSIS — D6959 Other secondary thrombocytopenia: Secondary | ICD-10-CM | POA: Diagnosis not present

## 2017-06-15 DIAGNOSIS — G4089 Other seizures: Secondary | ICD-10-CM | POA: Diagnosis not present

## 2017-06-15 DIAGNOSIS — F0390 Unspecified dementia without behavioral disturbance: Secondary | ICD-10-CM | POA: Diagnosis not present

## 2017-06-15 DIAGNOSIS — C712 Malignant neoplasm of temporal lobe: Secondary | ICD-10-CM | POA: Diagnosis not present

## 2017-06-15 DIAGNOSIS — I82403 Acute embolism and thrombosis of unspecified deep veins of lower extremity, bilateral: Secondary | ICD-10-CM | POA: Diagnosis not present

## 2017-06-16 DIAGNOSIS — F0390 Unspecified dementia without behavioral disturbance: Secondary | ICD-10-CM | POA: Diagnosis not present

## 2017-06-16 DIAGNOSIS — I82403 Acute embolism and thrombosis of unspecified deep veins of lower extremity, bilateral: Secondary | ICD-10-CM | POA: Diagnosis not present

## 2017-06-16 DIAGNOSIS — G4089 Other seizures: Secondary | ICD-10-CM | POA: Diagnosis not present

## 2017-06-16 DIAGNOSIS — D6959 Other secondary thrombocytopenia: Secondary | ICD-10-CM | POA: Diagnosis not present

## 2017-06-16 DIAGNOSIS — C712 Malignant neoplasm of temporal lobe: Secondary | ICD-10-CM | POA: Diagnosis not present

## 2017-06-16 DIAGNOSIS — E039 Hypothyroidism, unspecified: Secondary | ICD-10-CM | POA: Diagnosis not present

## 2017-06-17 DIAGNOSIS — C712 Malignant neoplasm of temporal lobe: Secondary | ICD-10-CM | POA: Diagnosis not present

## 2017-06-17 DIAGNOSIS — D6959 Other secondary thrombocytopenia: Secondary | ICD-10-CM | POA: Diagnosis not present

## 2017-06-17 DIAGNOSIS — I82403 Acute embolism and thrombosis of unspecified deep veins of lower extremity, bilateral: Secondary | ICD-10-CM | POA: Diagnosis not present

## 2017-06-17 DIAGNOSIS — G4089 Other seizures: Secondary | ICD-10-CM | POA: Diagnosis not present

## 2017-06-17 DIAGNOSIS — F0390 Unspecified dementia without behavioral disturbance: Secondary | ICD-10-CM | POA: Diagnosis not present

## 2017-06-17 DIAGNOSIS — E039 Hypothyroidism, unspecified: Secondary | ICD-10-CM | POA: Diagnosis not present

## 2017-06-18 DIAGNOSIS — D6959 Other secondary thrombocytopenia: Secondary | ICD-10-CM | POA: Diagnosis not present

## 2017-06-18 DIAGNOSIS — F0390 Unspecified dementia without behavioral disturbance: Secondary | ICD-10-CM | POA: Diagnosis not present

## 2017-06-18 DIAGNOSIS — C712 Malignant neoplasm of temporal lobe: Secondary | ICD-10-CM | POA: Diagnosis not present

## 2017-06-18 DIAGNOSIS — I82403 Acute embolism and thrombosis of unspecified deep veins of lower extremity, bilateral: Secondary | ICD-10-CM | POA: Diagnosis not present

## 2017-06-18 DIAGNOSIS — E039 Hypothyroidism, unspecified: Secondary | ICD-10-CM | POA: Diagnosis not present

## 2017-06-18 DIAGNOSIS — G4089 Other seizures: Secondary | ICD-10-CM | POA: Diagnosis not present

## 2017-06-20 DIAGNOSIS — F0390 Unspecified dementia without behavioral disturbance: Secondary | ICD-10-CM | POA: Diagnosis not present

## 2017-06-20 DIAGNOSIS — D6959 Other secondary thrombocytopenia: Secondary | ICD-10-CM | POA: Diagnosis not present

## 2017-06-20 DIAGNOSIS — Z95828 Presence of other vascular implants and grafts: Secondary | ICD-10-CM | POA: Diagnosis not present

## 2017-06-20 DIAGNOSIS — Z741 Need for assistance with personal care: Secondary | ICD-10-CM | POA: Diagnosis not present

## 2017-06-20 DIAGNOSIS — G4089 Other seizures: Secondary | ICD-10-CM | POA: Diagnosis not present

## 2017-06-20 DIAGNOSIS — C712 Malignant neoplasm of temporal lobe: Secondary | ICD-10-CM | POA: Diagnosis not present

## 2017-06-20 DIAGNOSIS — R1310 Dysphagia, unspecified: Secondary | ICD-10-CM | POA: Diagnosis not present

## 2017-06-20 DIAGNOSIS — Z9221 Personal history of antineoplastic chemotherapy: Secondary | ICD-10-CM | POA: Diagnosis not present

## 2017-06-20 DIAGNOSIS — Z6822 Body mass index (BMI) 22.0-22.9, adult: Secondary | ICD-10-CM | POA: Diagnosis not present

## 2017-06-20 DIAGNOSIS — Z8619 Personal history of other infectious and parasitic diseases: Secondary | ICD-10-CM | POA: Diagnosis not present

## 2017-06-20 DIAGNOSIS — Z923 Personal history of irradiation: Secondary | ICD-10-CM | POA: Diagnosis not present

## 2017-06-20 DIAGNOSIS — E039 Hypothyroidism, unspecified: Secondary | ICD-10-CM | POA: Diagnosis not present

## 2017-06-20 DIAGNOSIS — D649 Anemia, unspecified: Secondary | ICD-10-CM | POA: Diagnosis not present

## 2017-06-20 DIAGNOSIS — I82403 Acute embolism and thrombosis of unspecified deep veins of lower extremity, bilateral: Secondary | ICD-10-CM | POA: Diagnosis not present

## 2017-06-23 DIAGNOSIS — G4089 Other seizures: Secondary | ICD-10-CM | POA: Diagnosis not present

## 2017-06-23 DIAGNOSIS — I82403 Acute embolism and thrombosis of unspecified deep veins of lower extremity, bilateral: Secondary | ICD-10-CM | POA: Diagnosis not present

## 2017-06-23 DIAGNOSIS — E039 Hypothyroidism, unspecified: Secondary | ICD-10-CM | POA: Diagnosis not present

## 2017-06-23 DIAGNOSIS — D6959 Other secondary thrombocytopenia: Secondary | ICD-10-CM | POA: Diagnosis not present

## 2017-06-23 DIAGNOSIS — C712 Malignant neoplasm of temporal lobe: Secondary | ICD-10-CM | POA: Diagnosis not present

## 2017-06-23 DIAGNOSIS — F0390 Unspecified dementia without behavioral disturbance: Secondary | ICD-10-CM | POA: Diagnosis not present

## 2017-06-25 DIAGNOSIS — D6959 Other secondary thrombocytopenia: Secondary | ICD-10-CM | POA: Diagnosis not present

## 2017-06-25 DIAGNOSIS — F0390 Unspecified dementia without behavioral disturbance: Secondary | ICD-10-CM | POA: Diagnosis not present

## 2017-06-25 DIAGNOSIS — C712 Malignant neoplasm of temporal lobe: Secondary | ICD-10-CM | POA: Diagnosis not present

## 2017-06-25 DIAGNOSIS — E039 Hypothyroidism, unspecified: Secondary | ICD-10-CM | POA: Diagnosis not present

## 2017-06-25 DIAGNOSIS — I82403 Acute embolism and thrombosis of unspecified deep veins of lower extremity, bilateral: Secondary | ICD-10-CM | POA: Diagnosis not present

## 2017-06-25 DIAGNOSIS — G4089 Other seizures: Secondary | ICD-10-CM | POA: Diagnosis not present

## 2017-06-26 DIAGNOSIS — G4089 Other seizures: Secondary | ICD-10-CM | POA: Diagnosis not present

## 2017-06-26 DIAGNOSIS — I82403 Acute embolism and thrombosis of unspecified deep veins of lower extremity, bilateral: Secondary | ICD-10-CM | POA: Diagnosis not present

## 2017-06-26 DIAGNOSIS — E039 Hypothyroidism, unspecified: Secondary | ICD-10-CM | POA: Diagnosis not present

## 2017-06-26 DIAGNOSIS — C712 Malignant neoplasm of temporal lobe: Secondary | ICD-10-CM | POA: Diagnosis not present

## 2017-06-26 DIAGNOSIS — D6959 Other secondary thrombocytopenia: Secondary | ICD-10-CM | POA: Diagnosis not present

## 2017-06-26 DIAGNOSIS — F0390 Unspecified dementia without behavioral disturbance: Secondary | ICD-10-CM | POA: Diagnosis not present

## 2017-06-29 DIAGNOSIS — G4089 Other seizures: Secondary | ICD-10-CM | POA: Diagnosis not present

## 2017-06-29 DIAGNOSIS — F0390 Unspecified dementia without behavioral disturbance: Secondary | ICD-10-CM | POA: Diagnosis not present

## 2017-06-29 DIAGNOSIS — I82403 Acute embolism and thrombosis of unspecified deep veins of lower extremity, bilateral: Secondary | ICD-10-CM | POA: Diagnosis not present

## 2017-06-29 DIAGNOSIS — C712 Malignant neoplasm of temporal lobe: Secondary | ICD-10-CM | POA: Diagnosis not present

## 2017-06-29 DIAGNOSIS — D6959 Other secondary thrombocytopenia: Secondary | ICD-10-CM | POA: Diagnosis not present

## 2017-06-29 DIAGNOSIS — E039 Hypothyroidism, unspecified: Secondary | ICD-10-CM | POA: Diagnosis not present

## 2017-06-30 DIAGNOSIS — C712 Malignant neoplasm of temporal lobe: Secondary | ICD-10-CM | POA: Diagnosis not present

## 2017-06-30 DIAGNOSIS — D6959 Other secondary thrombocytopenia: Secondary | ICD-10-CM | POA: Diagnosis not present

## 2017-06-30 DIAGNOSIS — G4089 Other seizures: Secondary | ICD-10-CM | POA: Diagnosis not present

## 2017-06-30 DIAGNOSIS — F0390 Unspecified dementia without behavioral disturbance: Secondary | ICD-10-CM | POA: Diagnosis not present

## 2017-06-30 DIAGNOSIS — E039 Hypothyroidism, unspecified: Secondary | ICD-10-CM | POA: Diagnosis not present

## 2017-06-30 DIAGNOSIS — I82403 Acute embolism and thrombosis of unspecified deep veins of lower extremity, bilateral: Secondary | ICD-10-CM | POA: Diagnosis not present

## 2017-07-02 DIAGNOSIS — E039 Hypothyroidism, unspecified: Secondary | ICD-10-CM | POA: Diagnosis not present

## 2017-07-02 DIAGNOSIS — D6959 Other secondary thrombocytopenia: Secondary | ICD-10-CM | POA: Diagnosis not present

## 2017-07-02 DIAGNOSIS — I82403 Acute embolism and thrombosis of unspecified deep veins of lower extremity, bilateral: Secondary | ICD-10-CM | POA: Diagnosis not present

## 2017-07-02 DIAGNOSIS — C712 Malignant neoplasm of temporal lobe: Secondary | ICD-10-CM | POA: Diagnosis not present

## 2017-07-02 DIAGNOSIS — G4089 Other seizures: Secondary | ICD-10-CM | POA: Diagnosis not present

## 2017-07-02 DIAGNOSIS — F0390 Unspecified dementia without behavioral disturbance: Secondary | ICD-10-CM | POA: Diagnosis not present

## 2017-07-03 DIAGNOSIS — F0390 Unspecified dementia without behavioral disturbance: Secondary | ICD-10-CM | POA: Diagnosis not present

## 2017-07-03 DIAGNOSIS — I82403 Acute embolism and thrombosis of unspecified deep veins of lower extremity, bilateral: Secondary | ICD-10-CM | POA: Diagnosis not present

## 2017-07-03 DIAGNOSIS — C712 Malignant neoplasm of temporal lobe: Secondary | ICD-10-CM | POA: Diagnosis not present

## 2017-07-03 DIAGNOSIS — G4089 Other seizures: Secondary | ICD-10-CM | POA: Diagnosis not present

## 2017-07-03 DIAGNOSIS — E039 Hypothyroidism, unspecified: Secondary | ICD-10-CM | POA: Diagnosis not present

## 2017-07-03 DIAGNOSIS — D6959 Other secondary thrombocytopenia: Secondary | ICD-10-CM | POA: Diagnosis not present

## 2017-07-06 DIAGNOSIS — I82403 Acute embolism and thrombosis of unspecified deep veins of lower extremity, bilateral: Secondary | ICD-10-CM | POA: Diagnosis not present

## 2017-07-06 DIAGNOSIS — E039 Hypothyroidism, unspecified: Secondary | ICD-10-CM | POA: Diagnosis not present

## 2017-07-06 DIAGNOSIS — G4089 Other seizures: Secondary | ICD-10-CM | POA: Diagnosis not present

## 2017-07-06 DIAGNOSIS — C712 Malignant neoplasm of temporal lobe: Secondary | ICD-10-CM | POA: Diagnosis not present

## 2017-07-06 DIAGNOSIS — F0390 Unspecified dementia without behavioral disturbance: Secondary | ICD-10-CM | POA: Diagnosis not present

## 2017-07-06 DIAGNOSIS — D6959 Other secondary thrombocytopenia: Secondary | ICD-10-CM | POA: Diagnosis not present

## 2017-07-07 DIAGNOSIS — I82403 Acute embolism and thrombosis of unspecified deep veins of lower extremity, bilateral: Secondary | ICD-10-CM | POA: Diagnosis not present

## 2017-07-07 DIAGNOSIS — D6959 Other secondary thrombocytopenia: Secondary | ICD-10-CM | POA: Diagnosis not present

## 2017-07-07 DIAGNOSIS — C712 Malignant neoplasm of temporal lobe: Secondary | ICD-10-CM | POA: Diagnosis not present

## 2017-07-07 DIAGNOSIS — F0390 Unspecified dementia without behavioral disturbance: Secondary | ICD-10-CM | POA: Diagnosis not present

## 2017-07-07 DIAGNOSIS — E039 Hypothyroidism, unspecified: Secondary | ICD-10-CM | POA: Diagnosis not present

## 2017-07-07 DIAGNOSIS — G4089 Other seizures: Secondary | ICD-10-CM | POA: Diagnosis not present

## 2017-07-09 DIAGNOSIS — F0390 Unspecified dementia without behavioral disturbance: Secondary | ICD-10-CM | POA: Diagnosis not present

## 2017-07-09 DIAGNOSIS — E039 Hypothyroidism, unspecified: Secondary | ICD-10-CM | POA: Diagnosis not present

## 2017-07-09 DIAGNOSIS — I82403 Acute embolism and thrombosis of unspecified deep veins of lower extremity, bilateral: Secondary | ICD-10-CM | POA: Diagnosis not present

## 2017-07-09 DIAGNOSIS — D6959 Other secondary thrombocytopenia: Secondary | ICD-10-CM | POA: Diagnosis not present

## 2017-07-09 DIAGNOSIS — G4089 Other seizures: Secondary | ICD-10-CM | POA: Diagnosis not present

## 2017-07-09 DIAGNOSIS — C712 Malignant neoplasm of temporal lobe: Secondary | ICD-10-CM | POA: Diagnosis not present

## 2017-07-10 DIAGNOSIS — C712 Malignant neoplasm of temporal lobe: Secondary | ICD-10-CM | POA: Diagnosis not present

## 2017-07-10 DIAGNOSIS — E039 Hypothyroidism, unspecified: Secondary | ICD-10-CM | POA: Diagnosis not present

## 2017-07-10 DIAGNOSIS — D6959 Other secondary thrombocytopenia: Secondary | ICD-10-CM | POA: Diagnosis not present

## 2017-07-10 DIAGNOSIS — I82403 Acute embolism and thrombosis of unspecified deep veins of lower extremity, bilateral: Secondary | ICD-10-CM | POA: Diagnosis not present

## 2017-07-10 DIAGNOSIS — G4089 Other seizures: Secondary | ICD-10-CM | POA: Diagnosis not present

## 2017-07-10 DIAGNOSIS — F0390 Unspecified dementia without behavioral disturbance: Secondary | ICD-10-CM | POA: Diagnosis not present

## 2017-07-13 DIAGNOSIS — G4089 Other seizures: Secondary | ICD-10-CM | POA: Diagnosis not present

## 2017-07-13 DIAGNOSIS — E039 Hypothyroidism, unspecified: Secondary | ICD-10-CM | POA: Diagnosis not present

## 2017-07-13 DIAGNOSIS — D6959 Other secondary thrombocytopenia: Secondary | ICD-10-CM | POA: Diagnosis not present

## 2017-07-13 DIAGNOSIS — F0390 Unspecified dementia without behavioral disturbance: Secondary | ICD-10-CM | POA: Diagnosis not present

## 2017-07-13 DIAGNOSIS — C712 Malignant neoplasm of temporal lobe: Secondary | ICD-10-CM | POA: Diagnosis not present

## 2017-07-13 DIAGNOSIS — I82403 Acute embolism and thrombosis of unspecified deep veins of lower extremity, bilateral: Secondary | ICD-10-CM | POA: Diagnosis not present

## 2017-07-14 DIAGNOSIS — I82403 Acute embolism and thrombosis of unspecified deep veins of lower extremity, bilateral: Secondary | ICD-10-CM | POA: Diagnosis not present

## 2017-07-14 DIAGNOSIS — E039 Hypothyroidism, unspecified: Secondary | ICD-10-CM | POA: Diagnosis not present

## 2017-07-14 DIAGNOSIS — G4089 Other seizures: Secondary | ICD-10-CM | POA: Diagnosis not present

## 2017-07-14 DIAGNOSIS — F0390 Unspecified dementia without behavioral disturbance: Secondary | ICD-10-CM | POA: Diagnosis not present

## 2017-07-14 DIAGNOSIS — C712 Malignant neoplasm of temporal lobe: Secondary | ICD-10-CM | POA: Diagnosis not present

## 2017-07-14 DIAGNOSIS — D6959 Other secondary thrombocytopenia: Secondary | ICD-10-CM | POA: Diagnosis not present

## 2017-07-15 DIAGNOSIS — G4089 Other seizures: Secondary | ICD-10-CM | POA: Diagnosis not present

## 2017-07-15 DIAGNOSIS — E039 Hypothyroidism, unspecified: Secondary | ICD-10-CM | POA: Diagnosis not present

## 2017-07-15 DIAGNOSIS — F0390 Unspecified dementia without behavioral disturbance: Secondary | ICD-10-CM | POA: Diagnosis not present

## 2017-07-15 DIAGNOSIS — C712 Malignant neoplasm of temporal lobe: Secondary | ICD-10-CM | POA: Diagnosis not present

## 2017-07-15 DIAGNOSIS — I82403 Acute embolism and thrombosis of unspecified deep veins of lower extremity, bilateral: Secondary | ICD-10-CM | POA: Diagnosis not present

## 2017-07-15 DIAGNOSIS — D6959 Other secondary thrombocytopenia: Secondary | ICD-10-CM | POA: Diagnosis not present

## 2017-09-01 IMAGING — US US ABDOMEN COMPLETE
1 series · 14 of 25 positions shown · non-contrast
Comparison: Abdominal radiograph 04/03/2015

CLINICAL DATA: Patient with epigastric pain and lower pelvic pain.
Diarrhea for 2 weeks.

EXAM:
ULTRASOUND ABDOMEN COMPLETE

[Series 1: us abdomen complete · 0.11mm/px · 14 of 89 slices shown]
[im 1/89]
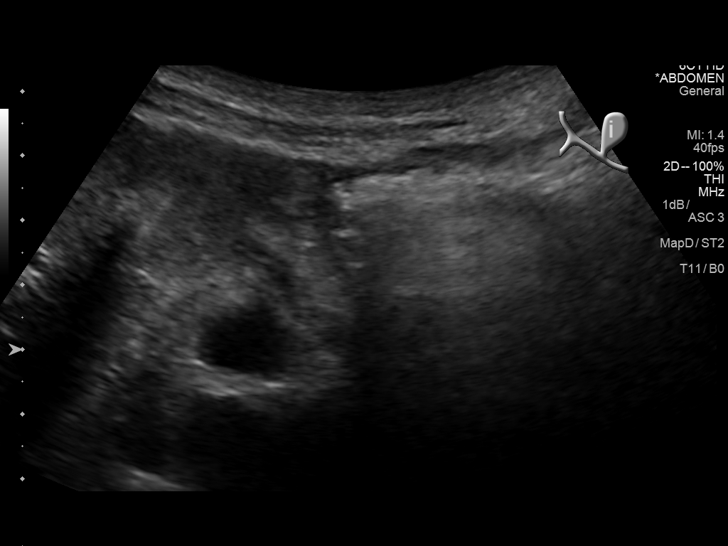
[im 8/89]
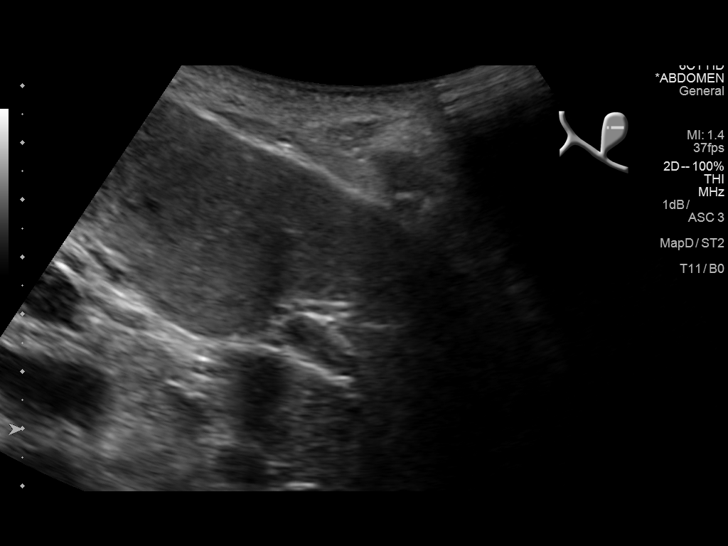
[im 15/89]
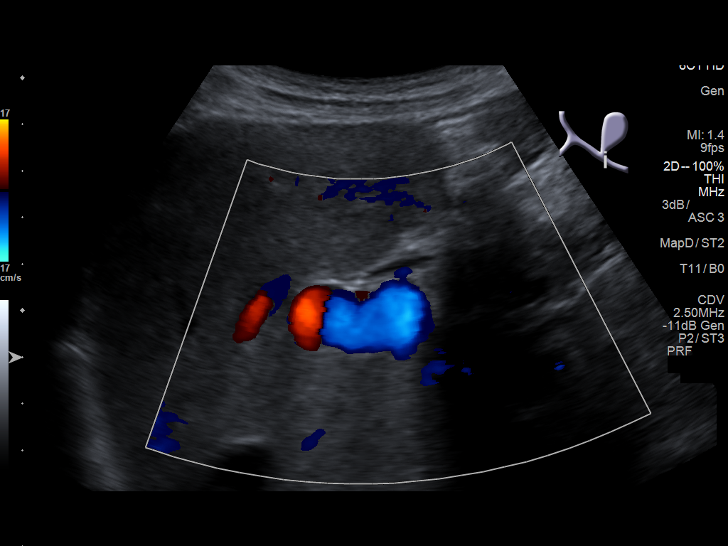
[im 23/89]
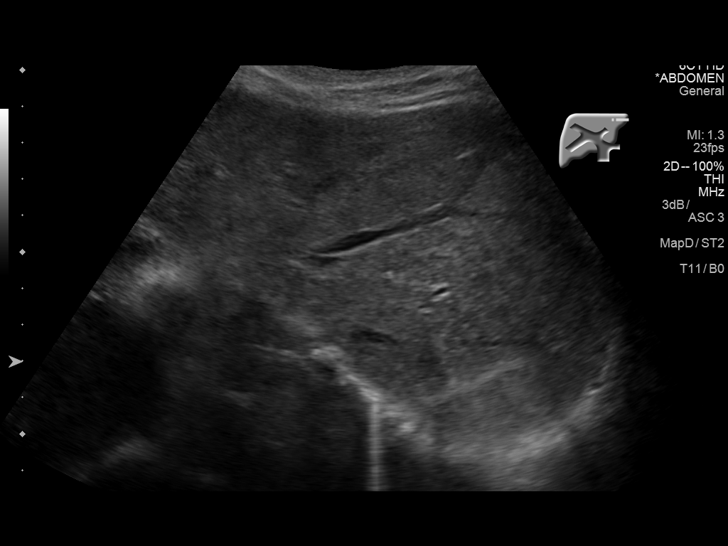
[im 30/89]
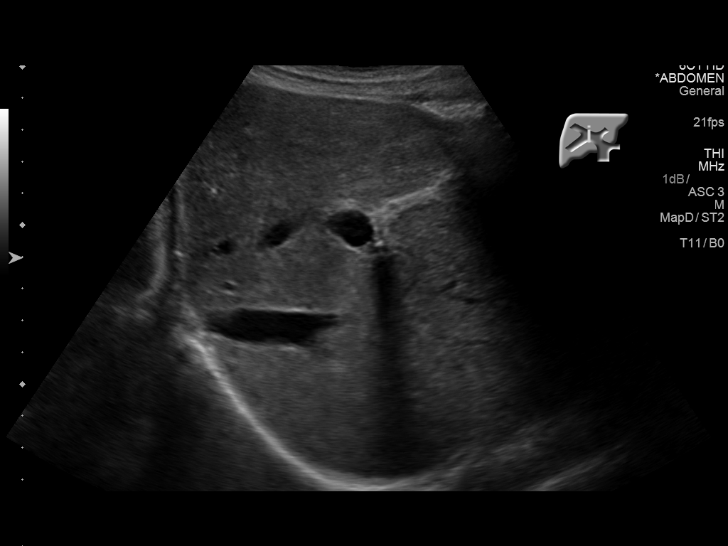
[im 34/89]
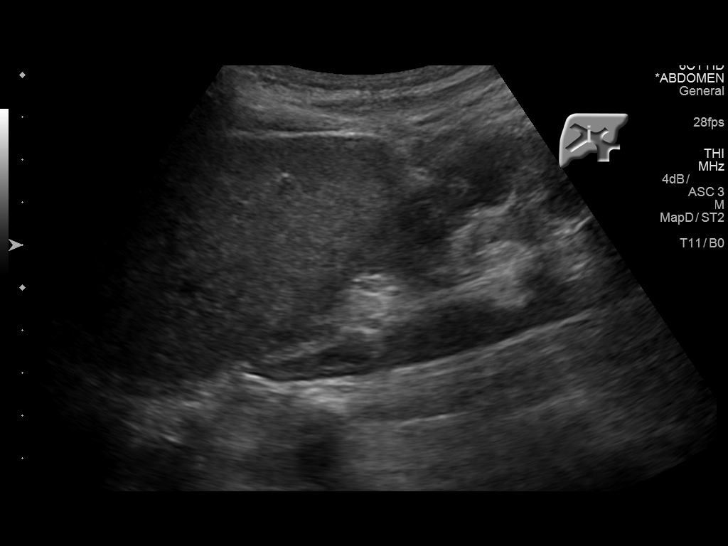
[im 41/89]
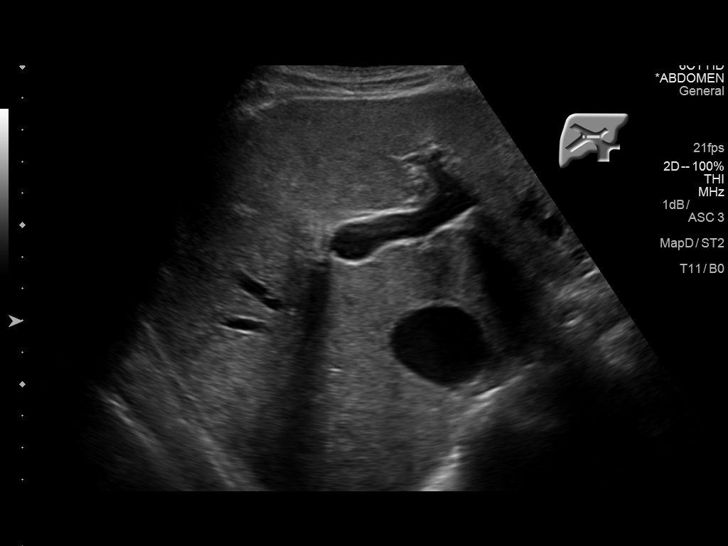
[im 48/89]
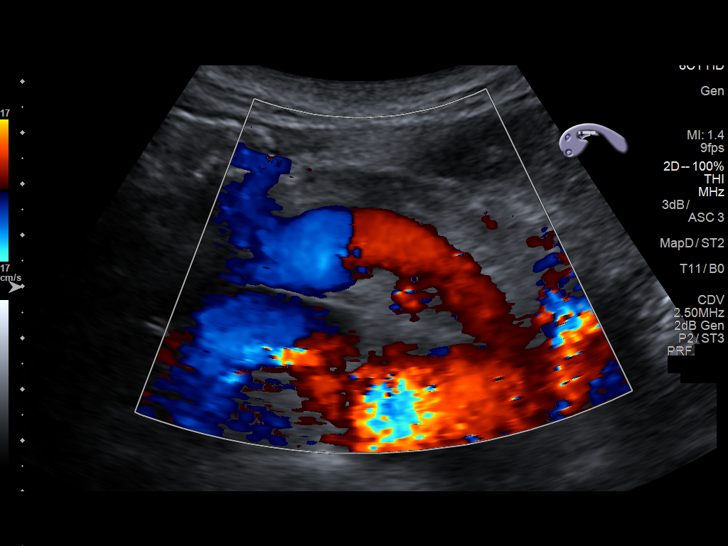
[im 56/89]
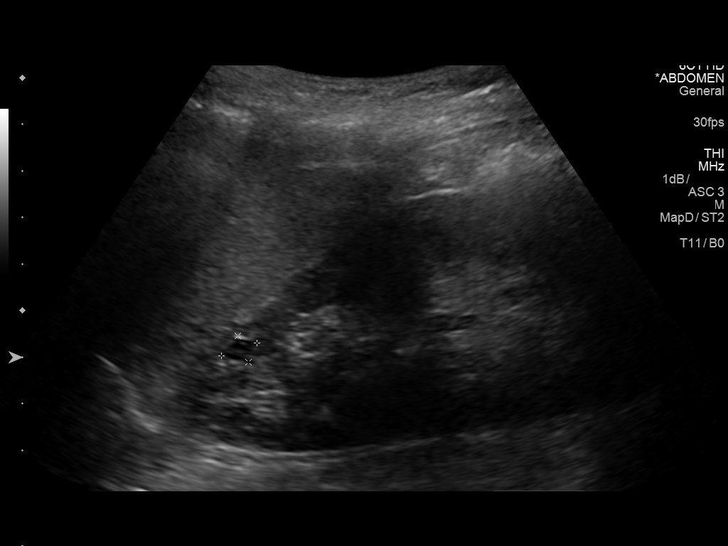
[im 59/89]
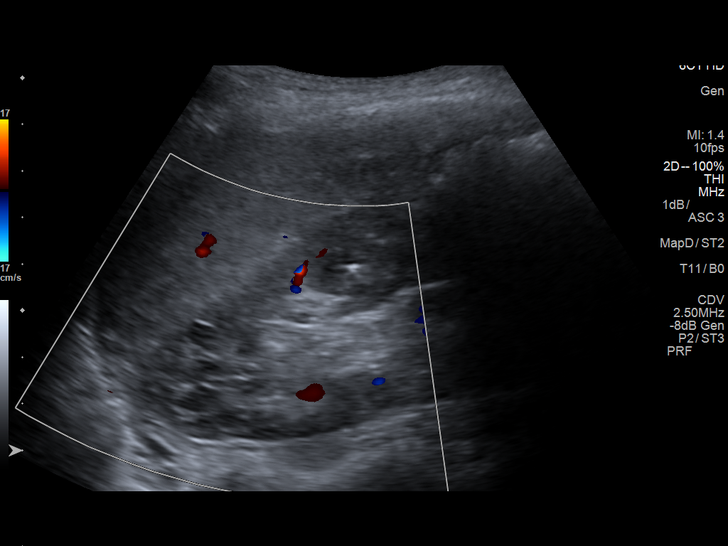
[im 67/89]
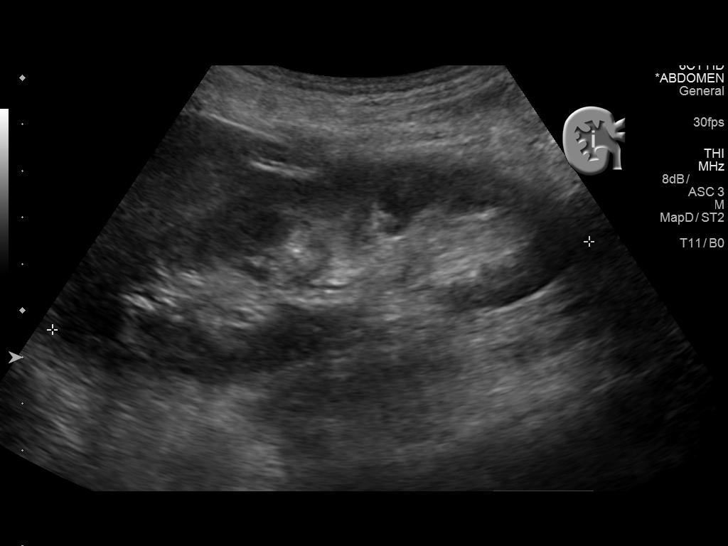
[im 74/89]
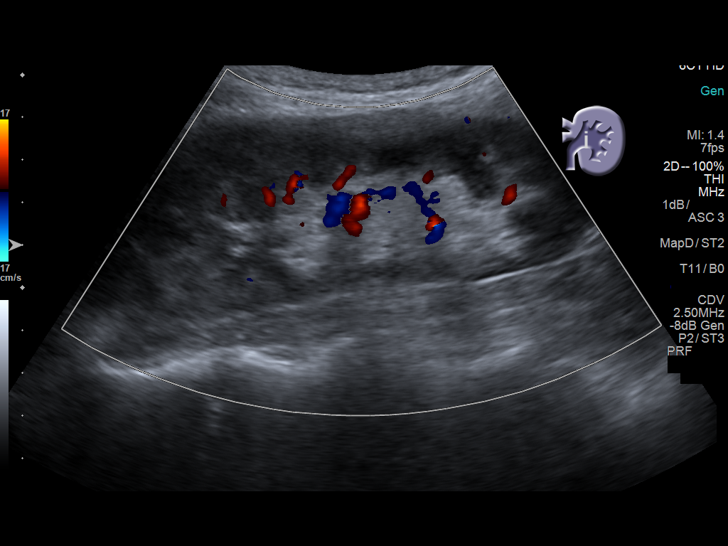
[im 81/89]
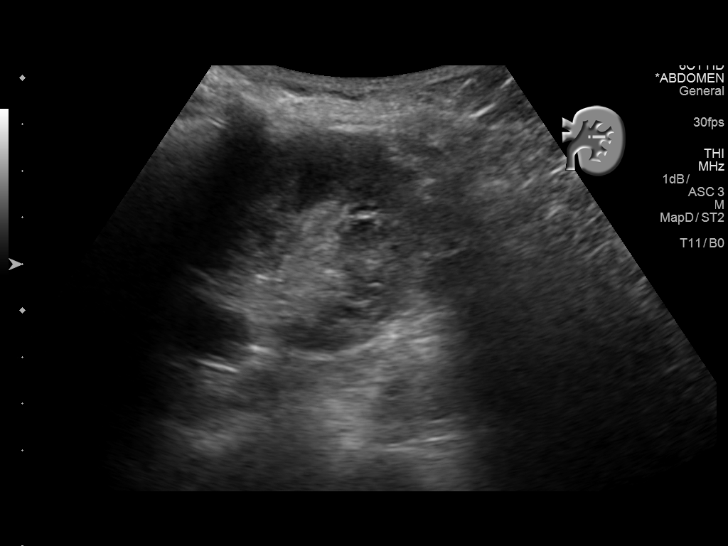
[im 89/89]
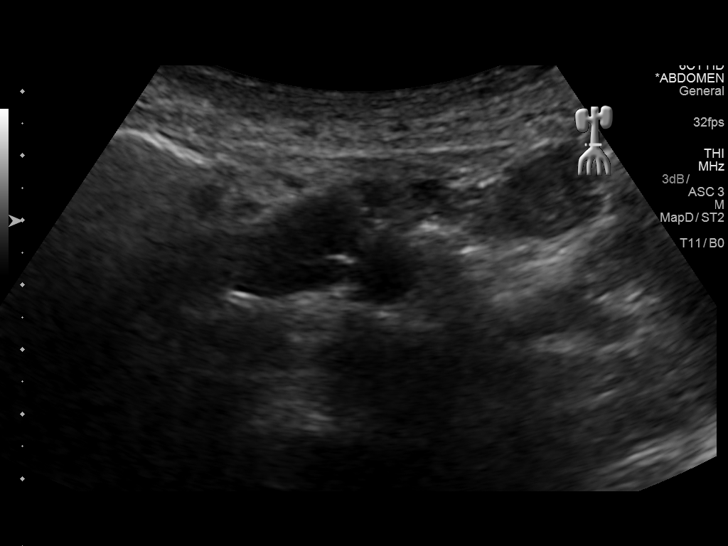

[14 of 25 positions shown; findings below may reference images not displayed]

FINDINGS: Gallbladder: No gallstones or wall thickening visualized. No
sonographic Murphy sign noted.

Common bile duct: Diameter: 2 mm

Liver: Mildly increased in echogenicity. No focal lesion identified.

IVC: No abnormality visualized.

Pancreas: Visualized portion unremarkable.

Spleen: Size and appearance within normal limits.

Right Kidney: Length: 11.7 cm. Echogenicity within normal limits. No
mass or hydronephrosis visualized.

Left Kidney: Length: 13.6 cm. Normal renal cortical thickness and
echogenicity. No hydronephrosis. Too small to characterize 8 mm
hypoechoic lesion superior pole.

Abdominal aorta: No aneurysm visualized.

Other findings: None.
IMPRESSION: No hydronephrosis.

No cholelithiasis or sonographic evidence for acute cholecystitis.

Hepatic steatosis.
